# Patient Record
Sex: Female | Born: 1955 | Race: White | Hispanic: No | Smoking: Never smoker
Health system: Southern US, Community
[De-identification: ages and names within clinical notes are randomized; demographics above are authoritative.]

## PROBLEM LIST (undated history)

## (undated) DIAGNOSIS — F32A Depression, unspecified: Secondary | ICD-10-CM

## (undated) DIAGNOSIS — Z01818 Encounter for other preprocedural examination: Secondary | ICD-10-CM

## (undated) DIAGNOSIS — M169 Osteoarthritis of hip, unspecified: Secondary | ICD-10-CM

## (undated) DIAGNOSIS — Z9189 Other specified personal risk factors, not elsewhere classified: Secondary | ICD-10-CM

## (undated) DIAGNOSIS — R011 Cardiac murmur, unspecified: Secondary | ICD-10-CM

## (undated) DIAGNOSIS — H9193 Unspecified hearing loss, bilateral: Secondary | ICD-10-CM

## (undated) DIAGNOSIS — G5793 Unspecified mononeuropathy of bilateral lower limbs: Secondary | ICD-10-CM

## (undated) DIAGNOSIS — A6000 Herpesviral infection of urogenital system, unspecified: Secondary | ICD-10-CM

## (undated) DIAGNOSIS — Z974 Presence of external hearing-aid: Secondary | ICD-10-CM

## (undated) DIAGNOSIS — K121 Other forms of stomatitis: Secondary | ICD-10-CM

## (undated) DIAGNOSIS — M8588 Other specified disorders of bone density and structure, other site: Secondary | ICD-10-CM

## (undated) DIAGNOSIS — M722 Plantar fascial fibromatosis: Secondary | ICD-10-CM

## (undated) DIAGNOSIS — F329 Major depressive disorder, single episode, unspecified: Secondary | ICD-10-CM

## (undated) DIAGNOSIS — Z8669 Personal history of other diseases of the nervous system and sense organs: Secondary | ICD-10-CM

## (undated) DIAGNOSIS — K219 Gastro-esophageal reflux disease without esophagitis: Secondary | ICD-10-CM

## (undated) DIAGNOSIS — B009 Herpesviral infection, unspecified: Secondary | ICD-10-CM

## (undated) HISTORY — DX: Plantar fascial fibromatosis: M72.2

## (undated) HISTORY — DX: Other specified disorders of bone density and structure, other site: M85.88

## (undated) HISTORY — DX: Major depressive disorder, single episode, unspecified: F32.9

## (undated) HISTORY — DX: Depression, unspecified: F32.A

## (undated) HISTORY — PX: FOOT SURGERY: SHX648

## (undated) HISTORY — DX: Osteoarthritis of hip, unspecified: M16.9

## (undated) HISTORY — DX: Herpesviral infection of urogenital system, unspecified: A60.00

## (undated) HISTORY — DX: Unspecified mononeuropathy of bilateral lower limbs: G57.93

## (undated) HISTORY — DX: Other forms of stomatitis: K12.1

## (undated) HISTORY — DX: Cardiac murmur, unspecified: R01.1

## (undated) HISTORY — DX: Herpesviral infection, unspecified: B00.9

## (undated) HISTORY — DX: Unspecified hearing loss, bilateral: H91.93

## (undated) HISTORY — DX: Gastro-esophageal reflux disease without esophagitis: K21.9

---

## 1990-04-10 HISTORY — PX: UPPER GI ENDOSCOPY: SHX6162

## 2004-06-14 ENCOUNTER — Ambulatory Visit: Payer: Self-pay | Admitting: Unknown Physician Specialty

## 2004-10-25 ENCOUNTER — Ambulatory Visit: Payer: Self-pay | Admitting: Neurology

## 2004-11-28 ENCOUNTER — Ambulatory Visit: Payer: Self-pay | Admitting: Neurology

## 2005-09-01 ENCOUNTER — Ambulatory Visit: Payer: Self-pay | Admitting: Unknown Physician Specialty

## 2006-09-12 ENCOUNTER — Ambulatory Visit: Payer: Self-pay | Admitting: Unknown Physician Specialty

## 2006-09-21 ENCOUNTER — Ambulatory Visit: Payer: Self-pay | Admitting: Unknown Physician Specialty

## 2007-10-23 ENCOUNTER — Ambulatory Visit: Payer: Self-pay | Admitting: Unknown Physician Specialty

## 2008-04-10 DIAGNOSIS — M8588 Other specified disorders of bone density and structure, other site: Secondary | ICD-10-CM

## 2008-04-10 HISTORY — PX: COLONOSCOPY: SHX174

## 2008-04-10 HISTORY — DX: Other specified disorders of bone density and structure, other site: M85.88

## 2008-09-14 ENCOUNTER — Ambulatory Visit: Payer: Self-pay | Admitting: Specialist

## 2008-09-22 ENCOUNTER — Inpatient Hospital Stay: Payer: Self-pay | Admitting: Specialist

## 2008-10-08 HISTORY — PX: TOTAL HIP ARTHROPLASTY: SHX124

## 2009-01-19 ENCOUNTER — Ambulatory Visit: Payer: Self-pay | Admitting: Unknown Physician Specialty

## 2009-02-10 ENCOUNTER — Ambulatory Visit: Payer: Self-pay | Admitting: Unknown Physician Specialty

## 2009-03-26 ENCOUNTER — Ambulatory Visit: Payer: Self-pay | Admitting: Unknown Physician Specialty

## 2009-04-05 LAB — HM COLONOSCOPY

## 2010-01-25 ENCOUNTER — Ambulatory Visit: Payer: Self-pay | Admitting: Unknown Physician Specialty

## 2011-01-31 ENCOUNTER — Ambulatory Visit: Payer: Self-pay | Admitting: Unknown Physician Specialty

## 2011-06-09 HISTORY — PX: REPLACEMENT TOTAL KNEE: SUR1224

## 2011-06-13 DIAGNOSIS — M1710 Unilateral primary osteoarthritis, unspecified knee: Secondary | ICD-10-CM | POA: Insufficient documentation

## 2011-06-13 DIAGNOSIS — M23305 Other meniscus derangements, unspecified medial meniscus, unspecified knee: Secondary | ICD-10-CM | POA: Insufficient documentation

## 2011-08-21 ENCOUNTER — Encounter: Payer: Self-pay | Admitting: Orthopaedic Surgery

## 2011-09-09 ENCOUNTER — Encounter: Payer: Self-pay | Admitting: Orthopaedic Surgery

## 2011-11-13 DIAGNOSIS — S83419A Sprain of medial collateral ligament of unspecified knee, initial encounter: Secondary | ICD-10-CM | POA: Insufficient documentation

## 2012-09-09 DIAGNOSIS — T84498A Other mechanical complication of other internal orthopedic devices, implants and grafts, initial encounter: Secondary | ICD-10-CM | POA: Insufficient documentation

## 2012-09-09 DIAGNOSIS — M25559 Pain in unspecified hip: Secondary | ICD-10-CM | POA: Insufficient documentation

## 2014-02-06 ENCOUNTER — Ambulatory Visit: Payer: Self-pay | Admitting: Family Medicine

## 2014-08-31 ENCOUNTER — Other Ambulatory Visit: Payer: Self-pay | Admitting: Family Medicine

## 2014-08-31 ENCOUNTER — Ambulatory Visit
Admission: RE | Admit: 2014-08-31 | Discharge: 2014-08-31 | Disposition: A | Discharge status: Home / Self Care | Payer: 59 | Source: Ambulatory Visit | Attending: Family Medicine | Admitting: Family Medicine

## 2014-08-31 DIAGNOSIS — J3489 Other specified disorders of nose and nasal sinuses: Secondary | ICD-10-CM | POA: Insufficient documentation

## 2014-09-09 DIAGNOSIS — R0602 Shortness of breath: Secondary | ICD-10-CM | POA: Insufficient documentation

## 2014-09-09 DIAGNOSIS — R9431 Abnormal electrocardiogram [ECG] [EKG]: Secondary | ICD-10-CM | POA: Insufficient documentation

## 2014-11-02 ENCOUNTER — Ambulatory Visit: Payer: Self-pay | Admitting: Family Medicine

## 2014-11-27 ENCOUNTER — Other Ambulatory Visit: Payer: Self-pay | Admitting: Family Medicine

## 2014-11-27 NOTE — Telephone Encounter (Signed)
Routing to provider  

## 2015-01-19 ENCOUNTER — Other Ambulatory Visit: Payer: Self-pay | Admitting: Family Medicine

## 2015-01-19 NOTE — Telephone Encounter (Signed)
Approved, med list duplicates removed

## 2015-01-19 NOTE — Telephone Encounter (Signed)
Routing to provider  

## 2015-03-03 DIAGNOSIS — F32A Depression, unspecified: Secondary | ICD-10-CM | POA: Insufficient documentation

## 2015-03-03 DIAGNOSIS — K121 Other forms of stomatitis: Secondary | ICD-10-CM | POA: Insufficient documentation

## 2015-03-03 DIAGNOSIS — K219 Gastro-esophageal reflux disease without esophagitis: Secondary | ICD-10-CM | POA: Insufficient documentation

## 2015-03-03 DIAGNOSIS — F329 Major depressive disorder, single episode, unspecified: Secondary | ICD-10-CM | POA: Insufficient documentation

## 2015-03-03 DIAGNOSIS — H9193 Unspecified hearing loss, bilateral: Secondary | ICD-10-CM | POA: Insufficient documentation

## 2015-03-03 DIAGNOSIS — A6000 Herpesviral infection of urogenital system, unspecified: Secondary | ICD-10-CM | POA: Insufficient documentation

## 2015-03-03 DIAGNOSIS — B009 Herpesviral infection, unspecified: Secondary | ICD-10-CM | POA: Insufficient documentation

## 2015-03-03 DIAGNOSIS — M169 Osteoarthritis of hip, unspecified: Secondary | ICD-10-CM | POA: Insufficient documentation

## 2015-03-03 DIAGNOSIS — M722 Plantar fascial fibromatosis: Secondary | ICD-10-CM | POA: Insufficient documentation

## 2015-03-10 ENCOUNTER — Encounter: Payer: Self-pay | Admitting: Family Medicine

## 2015-03-10 ENCOUNTER — Encounter: Payer: Self-pay | Admitting: *Deleted

## 2015-03-10 ENCOUNTER — Ambulatory Visit (INDEPENDENT_AMBULATORY_CARE_PROVIDER_SITE_OTHER): Payer: 59 | Admitting: Family Medicine

## 2015-03-10 VITALS — BP 106/69 | HR 66 | Temp 98.6°F | Ht 64.0 in | Wt 175.0 lb

## 2015-03-10 DIAGNOSIS — M545 Low back pain, unspecified: Secondary | ICD-10-CM

## 2015-03-10 DIAGNOSIS — M722 Plantar fascial fibromatosis: Secondary | ICD-10-CM | POA: Diagnosis not present

## 2015-03-10 DIAGNOSIS — R1310 Dysphagia, unspecified: Secondary | ICD-10-CM

## 2015-03-10 DIAGNOSIS — Z23 Encounter for immunization: Secondary | ICD-10-CM | POA: Diagnosis not present

## 2015-03-10 DIAGNOSIS — K219 Gastro-esophageal reflux disease without esophagitis: Secondary | ICD-10-CM | POA: Diagnosis not present

## 2015-03-10 DIAGNOSIS — M1612 Unilateral primary osteoarthritis, left hip: Secondary | ICD-10-CM

## 2015-03-10 LAB — MICROSCOPIC EXAMINATION

## 2015-03-10 MED ORDER — MELOXICAM 7.5 MG PO TABS: 7.5000 mg | ORAL_TABLET | Freq: Every day | ORAL | Status: DC

## 2015-03-10 MED ORDER — CYCLOBENZAPRINE HCL 5 MG PO TABS: 5.0000 mg | ORAL_TABLET | Freq: Three times a day (TID) | ORAL | Status: DC | PRN

## 2015-03-10 NOTE — Assessment & Plan Note (Signed)
Avoid triggers; on PPI; refer for consideration of EGD; has had esophageal stricture and stretching in the past

## 2015-03-10 NOTE — Patient Instructions (Addendum)
Return to see Amy at least 4 weeks after your flu shot to get the shingles vaccine You can shed live virus for up to 8 weeks after the vaccine Try turmeric as a natural anti-inflammatory (for pain and arthritis). It comes in capsules where you buy aspirin and fish oil, but also as a spice where you buy pepper and garlic powder. Try the lower dose of muscle relaxer in the late evening to see if that helps you sleep; do NOT mix with alcohol or pain pills or sleeping pills Do not drive for eight hours after taking the muscle relaxant We'll check your urine today Use the meloxicam daily if needed for inflammation and discomfort, but stop if making reflux worse Avoid triggers for reflux We'll have you see Dr. Lemar LivingsByrnett for consideration of an upper endoscopy

## 2015-03-10 NOTE — Progress Notes (Signed)
BP 106/69 mmHg  Pulse 66  Temp(Src) 98.6 F (37 C)  Ht  (1.626 m)  Wt 175 lb (79.379 kg)  BMI 30.02 kg/m2  SpO2 98%   Subjective:    Patient ID: Olivia Morgan, female    DOB: 1956/01/27, 59 y.o.   MRN: [id number]  HPI: Olivia Morgan is a 59 y.o. female  Chief Complaint  Patient presents with  . Back Pain    lower back, states its off and on, has been going to a chiropractor.  . Vaccine    She is interested in the shingles vacccine   She is having low back issues; going to chiropractor and that helps some; it's in the lower back; he has given her stretching exercises; she has tried heat and ice, alternates those; has taken tylenol 8 hour 650 mg pills just sometimes; no blood in the urine; there is a very specific point where chiropractor can hit and cause pain in the lower back; he knows exactly where it is; going on for a couple of months; was at the beach in [place] and it was crowded and she couldn't move much, was in a lot of pain after standing for 60 minutes and not being able to move much; no numbness or tingling down either leg; lower back will be worse after laying and has talked to chiropractor about her sleep; not a good sleeper; can't sleep on back; chiropractor suggested she sleep on her side and put pillow between legs; the chiropractor did xrays years ago but nothing recently; she has scoliosis; she just saw him yesterday; has a turned pelvis too; pain along left SI joint; she has tight muscles in her neck, but not in the lower back; sits at a desk all day; uses back pillow at work  She already got a flu shot in early November; she felt achy for a couple of days after the shot (less than 4 weeks ago)  She is interested in getting the shingles vaccine; her ex-mother-in-law got shingles and was miserable for weeks; she is 59 years old; no exposure to chemotherapy; she is not around pregnant women or cancer patients; friend on prednisone, so we discussed that causing  immunosuppression  She wondered about getting another upper endoscopy; no abd pain; takes PPI daily; some days, the pain just "kills me" she says; no sticking with swallowing exactly but sometimes when she eats, it almost stops and doesn't go down and then will go down on its own; this happens to her father and brother too; going on for years; gets hung and hurts; brother told her uncle had it too; no blood in stools  Relevant past medical, surgical, family and social history reviewed and updated as indicated. Interim medical history since our last visit reviewed. Allergies and medications reviewed and updated.  Review of Systems Per HPI unless specifically indicated above     Objective:    BP 106/69 mmHg  Pulse 66  Temp(Src) 98.6 F (37 C)  Ht  (1.626 m)  Wt 175 lb (79.379 kg)  BMI 30.02 kg/m2  SpO2 98%  Wt Readings from Last 3 Encounters:  03/10/15 175 lb (79.379 kg)  08/31/14 173 lb (78.472 kg)    Physical Exam  Constitutional: She appears well-developed and well-nourished.  Two pound weight gain over last 6 months; now in obese category  HENT:  Head: Normocephalic and atraumatic.  Right Ear: Decreased hearing is noted.  Left Ear: Decreased hearing is noted.  Mouth/Throat:  Mucous membranes are normal.  Cardiovascular: Normal rate.   Pulmonary/Chest: Effort normal. No accessory muscle usage. No respiratory distress.  Abdominal: Soft. Normal appearance. She exhibits no distension. There is no tenderness.  Musculoskeletal:       Thoracic back: She exhibits deformity (scoliosis).       Lumbar back: She exhibits tenderness and deformity (scoliosis). She exhibits normal range of motion, no bony tenderness and no spasm.  Neurological: She displays no tremor.  Hip flexion, dorsiflexion, plantarflexion 5/5  Skin: Skin is warm. No rash noted. No pallor.  Psychiatric: She has a normal mood and affect.   Results for orders placed or performed in visit on 03/10/15  Microscopic  Examination  Result Value Ref Range   WBC, UA 0-5 0 -  5 /hpf   RBC, UA 0-2 0 -  2 /hpf   Epithelial Cells (non renal) 0-10 0 - 10 /hpf   Bacteria, UA Few None seen/Few  UA/M w/rflx Culture, Routine  Result Value Ref Range   Urine Culture, Routine Final report    Urine Culture result 1 Comment       Assessment & Plan:   Problem List Items Addressed This Visit      Digestive   GERD (gastroesophageal reflux disease)    Avoid triggers; on PPI; refer for consideration of EGD; has had esophageal stricture and stretching in the past      Dysphagia    Will refer to GI for consideration of an EGD; she sounds to have positive family hx of same, so not sure if congenital deformity or muscle coordination, will have GI evaluate      Relevant Orders   Ambulatory referral to Gastroenterology     Musculoskeletal and Integument   OA (osteoarthritis) of hip    Consider that unilateral OA may have contributed to back pain if mechanics of walking have changed      Relevant Medications   meloxicam (MOBIC) 7.5 MG tablet   cyclobenzaprine (FLEXERIL) 5 MG tablet   Plantar fasciitis    perhaps walking differently may have contributed to back pain        Other   Lower back pain - Primary    Urine checked today to r/o microscopic hematuria; continue to see chiropractor; NSAID, muscle relaxant; let me know if not improving      Relevant Medications   meloxicam (MOBIC) 7.5 MG tablet   cyclobenzaprine (FLEXERIL) 5 MG tablet   Other Relevant Orders   UA/M w/rflx Culture, Routine (Completed)   Need for shingles vaccine    Will have her return in a few weeks to get the vaccine here; discussed and counseled, may shed live virus for up to 8 weeks, discussed susceptible populations she should avoid during that time including individuals on high dose prednisone or other immunosuppressive drugs         Follow up plan: Return if symptoms worsen or fail to improve, for and return to see Amy for a  shingles vaccine in about two weeks.  An after-visit summary was printed and given to the patient at check-out.  Please see the patient instructions which may contain other information and recommendations beyond what is mentioned above in the assessment and plan.  Meds ordered this encounter  Medications  . meloxicam (MOBIC) 7.5 MG tablet    Sig: Take 1 tablet (7.5 mg total) by mouth daily. If needed for back    Dispense:  30 tablet    Refill:  0  .  cyclobenzaprine (FLEXERIL) 5 MG tablet    Sig: Take 1 tablet (5 mg total) by mouth 3 (three) times daily as needed for muscle spasms.    Dispense:  30 tablet    Refill:  0

## 2015-03-12 LAB — UA/M W/RFLX CULTURE, ROUTINE

## 2015-03-14 DIAGNOSIS — Z23 Encounter for immunization: Secondary | ICD-10-CM | POA: Insufficient documentation

## 2015-03-14 NOTE — Assessment & Plan Note (Signed)
perhaps walking differently may have contributed to back pain

## 2015-03-14 NOTE — Assessment & Plan Note (Signed)
Will have her return in a few weeks to get the vaccine here; discussed and counseled, may shed live virus for up to 8 weeks, discussed susceptible populations she should avoid during that time including individuals on high dose prednisone or other immunosuppressive drugs

## 2015-03-14 NOTE — Assessment & Plan Note (Signed)
Urine checked today to r/o microscopic hematuria; continue to see chiropractor; NSAID, muscle relaxant; let me know if not improving

## 2015-03-14 NOTE — Assessment & Plan Note (Signed)
Consider that unilateral OA may have contributed to back pain if mechanics of walking have changed

## 2015-03-14 NOTE — Assessment & Plan Note (Signed)
Will refer to GI for consideration of an EGD; she sounds to have positive family hx of same, so not sure if congenital deformity or muscle coordination, will have GI evaluate

## 2015-03-18 ENCOUNTER — Ambulatory Visit (INDEPENDENT_AMBULATORY_CARE_PROVIDER_SITE_OTHER): Payer: 59 | Admitting: General Surgery

## 2015-03-18 ENCOUNTER — Encounter: Payer: Self-pay | Admitting: General Surgery

## 2015-03-18 VITALS — BP 128/78 | HR 60 | Resp 12 | Ht 64.0 in | Wt 175.0 lb

## 2015-03-18 DIAGNOSIS — K219 Gastro-esophageal reflux disease without esophagitis: Secondary | ICD-10-CM | POA: Diagnosis not present

## 2015-03-18 NOTE — Patient Instructions (Addendum)
The patient is aware to call back for any questions or concerns. Esophagogastroduodenoscopy Esophagogastroduodenoscopy (EGD) is a procedure that is used to examine the lining of the esophagus, stomach, and first part of the small intestine (duodenum). A long, flexible, lighted tube with a camera attached (endoscope) is inserted down the throat to view these organs. This procedure is done to detect problems or abnormalities, such as inflammation, bleeding, ulcers, or growths, in order to treat them. The procedure lasts 5-20 minutes. It is usually an outpatient procedure, but it may need to be performed in a hospital in emergency cases. LET [facility] CARE PROVIDER KNOW ABOUT:  Any allergies you have.  All medicines you are taking, including vitamins, herbs, eye drops, creams, and over-the-counter medicines.  Previous problems you or members of your family have had with the use of anesthetics.  Any blood disorders you have.  Previous surgeries you have had.  Medical conditions you have. RISKS AND COMPLICATIONS Generally, this is a safe procedure. However, problems can occur and include:  Infection.  Bleeding.  Tearing (perforation) of the esophagus, stomach, or duodenum.  Difficulty breathing or not being able to breathe.  Excessive sweating.  Spasms of the larynx.  Slowed heartbeat.  Low blood pressure. BEFORE THE PROCEDURE  Do not eat or drink anything after midnight on the night before the procedure or as directed by your health care provider.  Do not take your regular medicines before the procedure if your health care provider asks you not to. Ask your health care provider about changing or stopping those medicines.  If you wear dentures, be prepared to remove them before the procedure.  Arrange for someone to drive you home after the procedure. PROCEDURE  A numbing medicine (local anesthetic) may be sprayed in your throat for comfort and to stop you from gagging or  coughing.  You will have an IV tube inserted in a vein in your hand or arm. You will receive medicines and fluids through this tube.  You will be given a medicine to relax you (sedative).  A pain reliever will be given through the IV tube.  A mouth guard may be placed in your mouth to protect your teeth and to keep you from biting on the endoscope.  You will be asked to lie on your left side.  The endoscope will be inserted down your throat and into your esophagus, stomach, and duodenum.  Air will be put through the endoscope to allow your health care provider to clearly view the lining of your esophagus.  The lining of your esophagus, stomach, and duodenum will be examined. During the exam, your health care provider may:  Remove tissue to be examined under a microscope (biopsy) for inflammation, infection, or other medical problems.  Remove growths.  Remove objects (foreign bodies) that are stuck.  Treat any bleeding with medicines or other devices that stop tissues from bleeding (hot cautery, clipping devices).  Widen (dilate) or stretch narrowed areas of your esophagus and stomach.  The endoscope will be withdrawn. AFTER THE PROCEDURE  You will be taken to a recovery area for observation. Your blood pressure, heart rate, breathing rate, and blood oxygen level will be monitored often until the medicines you were given have worn off.  Do not eat or drink anything until the numbing medicine has worn off and your gag reflex has returned. You may choke.  Your health care provider should be able to discuss his or her findings with you. It will take  longer to discuss the test results if any biopsies were taken.   This information is not intended to replace advice given to you by your health care provider. Make sure you discuss any questions you have with your health care provider.   Document Released: 07/28/2004 Document Revised: 04/17/2014 Document Reviewed: 02/28/2012 Elsevier  Interactive Patient Education [employer]2016 [employer]

## 2015-03-18 NOTE — Progress Notes (Signed)
Patient ID: Olivia Morgan, female   DOB: 1956/01/31, 59 y.o.   MRN: [id number]  Chief Complaint  Patient presents with  . Other    acid reflex    HPI Olivia Morgan is a 59 y.o. female here today for a evaluation of acid reflex. She feels like the reflux is a little worse over the past 1-2 years.She states it is worse during the day and maybe associated with stress. Spicy foods ([place]) trigger the reflux.She feels it come up in her throat and it makes her cough. Occasionally, she has noticed that food may "hang" but feels it maybe stress as well. She states her weight has been steady over the past 3 years.  Cardiac evaluation was by Dr Gwen Pounds.  I personally reviewed the patient's history. HPI  Past Medical History  Diagnosis Date  . Herpes simplex virus infection   . OA (osteoarthritis) of hip     left  . GERD (gastroesophageal reflux disease)   . Plantar fasciitis   . Depression   . Stomatitis   . Genital herpes   . Bilateral hearing loss     wears hearing aids  . Murmur     Past Surgical History  Procedure Laterality Date  . Cesarean section    . 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 Replacement total knee  3/13    Dr.Hallows at [facility]  . Foot surgery Right   . Upper gi endoscopy  1992  . Colonoscopy  2010    Family History  Problem Relation Age of Onset  . Aneurysm Mother   . Stroke Mother   . Heart disease Father 74  . Hypertension Father   . Lung disease Father   . Heart disease Paternal Grandfather   . Hypertension Brother   . Cancer Neg Hx   . COPD Neg Hx   . Diabetes Neg Hx     Social History Social History  Substance Use Topics  . Smoking status: Never Smoker   . Smokeless tobacco: Never Used  . Alcohol Use: No    Allergies  Allergen Reactions  . Celecoxib Other (See Comments)  . Celexa [Citalopram Hydrobromide]   . Cymbalta [Duloxetine Hcl] Hives  . Levaquin [Levofloxacin In D5w] Hives  . Valium [Diazepam] Other (See  Comments)    hallucinations  . Monistat [Miconazole] Rash  . Prednisone Anxiety    Current Outpatient Prescriptions  Medication Sig Dispense Refill  . buPROPion (WELLBUTRIN XL) 150 MG 24 hr tablet Take 3 tablets by mouth  daily 270 tablet 1  . Cholecalciferol (VITAMIN D-3 PO) Take by mouth daily.    . cyclobenzaprine (FLEXERIL) 5 MG tablet Take 1 tablet (5 mg total) by mouth 3 (three) times daily as needed for muscle spasms. 30 tablet 0  . meloxicam (MOBIC) 7.5 MG tablet Take 1 tablet (7.5 mg total) by mouth daily. If needed for back 30 tablet 0  . Omega-3 Fatty Acids (FISH OIL PO) Take by mouth daily.    . RABEprazole (ACIPHEX) 20 MG tablet Take 2 tablets by mouth  daily as needed ;Prolonged  use increases risk of  osteoporosis, anemia,  pneumonia 180 tablet 0  . valACYclovir (VALTREX) 500 MG tablet Take 1 tablet by mouth  daily for suppressive  therapy 90 tablet 1  . VITAMIN E PO Take by mouth daily.     No current facility-administered medications for this visit.    Review of Systems Review of Systems  Constitutional: Negative.  Respiratory: Negative.   Cardiovascular: Negative.     Blood pressure 128/78, pulse 60, resp. rate 12, height 5\' 4"  (1.626 m), weight 175 lb (79.379 kg).  Physical Exam Physical Exam  Constitutional: She is oriented to person, place, and time. She appears well-developed and well-nourished.  HENT:  Mouth/Throat: Oropharynx is clear and moist.  Eyes: Conjunctivae are normal. No scleral icterus.  Neck: Neck supple.  Cardiovascular: Normal rate and regular rhythm.   Murmur heard.  Systolic murmur is present with a grade of 2/6  Pulmonary/Chest: Effort normal and breath sounds normal.  Lymphadenopathy:    She has no cervical adenopathy.  Neurological: She is alert and oriented to person, place, and time.  Skin: Skin is warm and dry.  Psychiatric: Her behavior is normal.    Data Reviewed No records available.  Assessment    Dysphasia     Plan    Options for evaluation including barium swallow versus endoscopy were discussed. We will start with an upper endoscopy, and follow up with a barium study if needed. The procedure was reviewed, risks and benefits discussed.       PCP:  Daryll BrodLada, Melinda P  Byrnett, Jeffrey W 03/19/2015, 6:49 AM

## 2015-03-19 DIAGNOSIS — K219 Gastro-esophageal reflux disease without esophagitis: Secondary | ICD-10-CM | POA: Insufficient documentation

## 2015-03-22 ENCOUNTER — Telehealth: Payer: Self-pay | Admitting: *Deleted

## 2015-03-22 NOTE — Telephone Encounter (Signed)
Message for patient to call the office.  [facility] can accommodate Dr. Lemar LivingsByrnett to complete upper endoscopy on Wednesday, 03-24-15.

## 2015-03-22 NOTE — Telephone Encounter (Signed)
Patient called the office back and was notified as instructed. She verbalizes understanding.   This patient was instructed to call the office should she have further questions.

## 2015-03-24 DIAGNOSIS — R1310 Dysphagia, unspecified: Secondary | ICD-10-CM | POA: Diagnosis not present

## 2015-04-02 ENCOUNTER — Telehealth: Payer: Self-pay | Admitting: Family Medicine

## 2015-04-02 MED ORDER — FLUCONAZOLE 150 MG PO TABS: 150.0000 mg | ORAL_TABLET | Freq: Once | ORAL | Status: DC

## 2015-04-02 NOTE — Telephone Encounter (Signed)
Forward to provider

## 2015-04-02 NOTE — Telephone Encounter (Signed)
done

## 2015-04-02 NOTE — Telephone Encounter (Signed)
Pt says she feels like she is getting a yeast infection and would like to have diflucan sent in to [facility].

## 2015-04-05 ENCOUNTER — Telehealth: Payer: Self-pay | Admitting: Family Medicine

## 2015-04-05 DIAGNOSIS — N183 Chronic kidney disease, stage 3 unspecified: Secondary | ICD-10-CM

## 2015-04-05 DIAGNOSIS — N182 Chronic kidney disease, stage 2 (mild): Secondary | ICD-10-CM | POA: Insufficient documentation

## 2015-04-05 DIAGNOSIS — D696 Thrombocytopenia, unspecified: Secondary | ICD-10-CM

## 2015-04-05 NOTE — Assessment & Plan Note (Signed)
GFR 53 May 2016

## 2015-04-05 NOTE — Telephone Encounter (Signed)
Please let Olivia Morgan know that we'd like her to come in for labs only Please schedule a lab visit in the next few weeks Fasting?  NO Just to keep an eye on her kidney function and platelets while taking Mobic (meloxicam) Thank you, Dr. Sherie DonLada I sent in the refill as requested

## 2015-04-05 NOTE — Assessment & Plan Note (Signed)
Check platelet count in next few weeks; platelet count was 147 in May 2016

## 2015-04-07 NOTE — Telephone Encounter (Signed)
Patient schedule appt on 04/13/05

## 2015-04-08 ENCOUNTER — Telehealth: Payer: Self-pay | Admitting: *Deleted

## 2015-04-08 NOTE — Telephone Encounter (Signed)
Patient called stating she wanted to know what the results were from the upper [facility] done [facility].

## 2015-04-14 ENCOUNTER — Telehealth: Payer: Self-pay | Admitting: General Surgery

## 2015-04-14 ENCOUNTER — Other Ambulatory Visit: Payer: 59

## 2015-04-14 DIAGNOSIS — N183 Chronic kidney disease, stage 3 unspecified: Secondary | ICD-10-CM

## 2015-04-14 NOTE — Telephone Encounter (Signed)
Results from her upper endoscopy completed for reflux symptoms from 03/24/2015 a become available. The visualization of the distal esophagus showed a near normal exam with minimal irritation. Nothing to suggest Barrett's epithelial changes.  The patient had numerous gastric polyps, several which were removed and showed benign, fundic gland polyps. No evidence of H. pylori infection.  The patient has noted some dietary response to her reflux symptoms and occasionally makes use of additional doses of AcipHex.  Patient was encouraged to consider 1) sees elevation of the head of the bed on blocks to minimize nocturnal reflex; 2) lower carb diet/avoidance of caffeine; 3) OTC H2 blocker such as Zantac for times of increased reflux symptoms and place an additional dose of AcipHex.  The patient will give the above-mentioned suggestions a trial for the next 6 weeks and give a phone follow-up with her progress.

## 2015-04-15 ENCOUNTER — Encounter: Payer: Self-pay | Admitting: Family Medicine

## 2015-04-15 LAB — BASIC METABOLIC PANEL
BUN / CREAT RATIO: 14 (ref 11–26)
BUN: 13 mg/dL (ref 8–27)
CALCIUM: 9.2 mg/dL (ref 8.7–10.3)
CO2: 25 mmol/L (ref 18–29)
CREATININE: 0.9 mg/dL (ref 0.57–1.00)
Chloride: 102 mmol/L (ref 96–106)
GFR calc non Af Amer: 70 mL/min/{1.73_m2} (ref >59)
GFR, EST AFRICAN AMERICAN: 80 mL/min/{1.73_m2} (ref >59)
GLUCOSE: 85 mg/dL (ref 65–99)
Potassium: 4.2 mmol/L (ref 3.5–5.2)
Sodium: 141 mmol/L (ref 134–144)

## 2015-04-15 LAB — CBC WITH DIFFERENTIAL/PLATELET
BASOS ABS: 0 10*3/uL (ref 0.0–0.2)
Basos: 1 %
EOS (ABSOLUTE): 0.2 10*3/uL (ref 0.0–0.4)
Eos: 3 %
HEMOGLOBIN: 14.6 g/dL (ref 11.1–15.9)
Hematocrit: 43.6 % (ref 34.0–46.6)
IMMATURE GRANS (ABS): 0 10*3/uL (ref 0.0–0.1)
IMMATURE GRANULOCYTES: 0 %
LYMPHS: 16 %
Lymphocytes Absolute: 0.8 10*3/uL (ref 0.7–3.1)
MCH: 30.9 pg (ref 26.6–33.0)
MCHC: 33.5 g/dL (ref 31.5–35.7)
MCV: 92 fL (ref 79–97)
MONOCYTES: 8 %
Monocytes Absolute: 0.4 10*3/uL (ref 0.1–0.9)
NEUTROS PCT: 72 %
Neutrophils Absolute: 3.4 10*3/uL (ref 1.4–7.0)
Platelets: 148 10*3/uL — ABNORMAL LOW (ref 150–379)
RBC: 4.72 x10E6/uL (ref 3.77–5.28)
RDW: 14.4 % (ref 12.3–15.4)
WBC: 4.7 10*3/uL (ref 3.4–10.8)

## 2015-04-22 ENCOUNTER — Encounter: Payer: Self-pay | Admitting: General Surgery

## 2015-04-23 ENCOUNTER — Other Ambulatory Visit: Payer: Self-pay | Admitting: Family Medicine

## 2015-04-24 NOTE — Telephone Encounter (Signed)
I reviewed Dr. Rutherford NailByrnett's note, EGD results; Rx approved

## 2015-04-26 ENCOUNTER — Encounter: Payer: Self-pay | Admitting: General Surgery

## 2015-05-03 ENCOUNTER — Other Ambulatory Visit: Payer: Self-pay | Admitting: Family Medicine

## 2015-05-03 NOTE — Telephone Encounter (Signed)
Jan 2017 labs reviewed; Rx approved

## 2015-05-14 ENCOUNTER — Ambulatory Visit: Payer: 59 | Admitting: Family Medicine

## 2015-05-20 ENCOUNTER — Ambulatory Visit (INDEPENDENT_AMBULATORY_CARE_PROVIDER_SITE_OTHER): Payer: 59 | Admitting: Family Medicine

## 2015-05-20 ENCOUNTER — Encounter: Payer: Self-pay | Admitting: Family Medicine

## 2015-05-20 VITALS — BP 106/69 | HR 62 | Temp 98.2°F | Wt 171.0 lb

## 2015-05-20 DIAGNOSIS — N182 Chronic kidney disease, stage 2 (mild): Secondary | ICD-10-CM

## 2015-05-20 DIAGNOSIS — M21612 Bunion of left foot: Secondary | ICD-10-CM | POA: Diagnosis not present

## 2015-05-20 DIAGNOSIS — M7062 Trochanteric bursitis, left hip: Secondary | ICD-10-CM

## 2015-05-20 DIAGNOSIS — Z23 Encounter for immunization: Secondary | ICD-10-CM

## 2015-05-20 DIAGNOSIS — G5793 Unspecified mononeuropathy of bilateral lower limbs: Secondary | ICD-10-CM | POA: Diagnosis not present

## 2015-05-20 MED ORDER — GABAPENTIN 100 MG PO CAPS
ORAL_CAPSULE | ORAL | Status: DC
Start: 2015-05-20 — End: 2015-06-11

## 2015-05-20 NOTE — Progress Notes (Signed)
BP 106/69 mmHg  Pulse 62  Temp(Src) 98.2 F (36.8 C)  Wt 171 lb (77.565 kg)  SpO2 98%   Subjective:    Patient ID: Olivia Morgan, female    DOB: [birth year], 60 y.o.   MRN: [id number]  HPI: Olivia Morgan is a 60 y.o. female  Chief Complaint  Patient presents with  . Foot Pain    left calf and down into her foot. She's had some tingling. No known injury. She's been standing alot at work.   . Immunizations    she is interested in the shingles vaccine   Last week or so, she noticed that she was having cramps in the back of her left calf; the hip replacement scar was a little sore; then some numbness and the ankle was hurting on the left; rubbing the bottom of the foot, she can feel some numbness; has had some issues since her hip surgery with great and 2nd toes with numbness She was sitting a lot in January and was working many many hours; was sitting much longer hours than usual; has pain over the hip as well She went to the chiropractor last week and thinks it was from all the sitting Toes are not cold She has not seen a neurologist She saw the foot doctor a year ago; he checked both foot; she has hammer toe and bunion on the left; he wants to fuse the right great toe; the third toe is already curling under She had nerve studies does a while ago; she took gabapentin after hip surgery Pain over the left greater trochanter (pointing)  She is interested in the shingles vaccine and had several concerns and questions  Relevant past medical, surgical history reviewed Past Medical History  Diagnosis Date  . Herpes simplex virus infection   . OA (osteoarthritis) of hip     left  . GERD (gastroesophageal reflux disease)   . Plantar fasciitis   . Depression   . Stomatitis   . Genital herpes   . Bilateral hearing loss     wears hearing aids  . Murmur   . Neuropathy of both feet (HCC) 06/05/2015   Past Surgical History  Procedure Laterality Date  . Cesarean section    .  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 Replacement total knee  3/13    Dr.Hallows at [facility]  . Foot surgery Right   . Upper gi endoscopy  1992  . Colonoscopy  2010    Interim medical history since our last visit reviewed. Allergies and medications reviewed and updated.  Review of Systems Per HPI unless specifically indicated above     Objective:    BP 106/69 mmHg  Pulse 62  Temp(Src) 98.2 F (36.8 C)  Wt 171 lb (77.565 kg)  SpO2 98%  Wt Readings from Last 3 Encounters:  05/20/15 171 lb (77.565 kg)  03/18/15 175 lb (79.379 kg)  03/10/15 175 lb (79.379 kg)    Physical Exam  Constitutional: She appears well-developed and well-nourished.  HENT:  Mouth/Throat: Mucous membranes are normal.  Eyes: EOM are normal. No scleral icterus.  Cardiovascular: Normal rate and regular rhythm.   Pulmonary/Chest: Effort normal and breath sounds normal.  Musculoskeletal:       Left hip: She exhibits tenderness.       Right foot: There is deformity (third toe with flexion).       Left foot: There is deformity (bunion).  Psychiatric: She has a normal mood and affect. Her  behavior is normal.   Results for orders placed or performed in visit on 04/14/15  CBC with Differential/Platelet  Result Value Ref Range   WBC 4.7 3.4 - 10.8 x10E3/uL   RBC 4.72 3.77 - 5.28 x10E6/uL   Hemoglobin 14.6 11.1 - 15.9 g/dL   Hematocrit 16.1 09.6 - 46.6 %   MCV 92 79 - 97 fL   MCH 30.9 26.6 - 33.0 pg   MCHC 33.5 31.5 - 35.7 g/dL   RDW 04.5 40.9 - 81.1 %   Platelets 148 (L) 150 - 379 x10E3/uL   Neutrophils 72 %   Lymphs 16 %   Monocytes 8 %   Eos 3 %   Basos 1 %   Neutrophils Absolute 3.4 1.4 - 7.0 x10E3/uL   Lymphocytes Absolute 0.8 0.7 - 3.1 x10E3/uL   Monocytes Absolute 0.4 0.1 - 0.9 x10E3/uL   EOS (ABSOLUTE) 0.2 0.0 - 0.4 x10E3/uL   Basophils Absolute 0.0 0.0 - 0.2 x10E3/uL   Immature Granulocytes 0 %   Immature Grans (Abs) 0.0 0.0 - 0.1 x10E3/uL  Basic Metabolic Panel (BMET)  Result  Value Ref Range   Glucose 85 65 - 99 mg/dL   BUN 13 8 - 27 mg/dL   Creatinine, Ser 9.14 0.57 - 1.00 mg/dL   GFR calc non Af Amer 70 >59 mL/min/1.73   GFR calc Af Amer 80 >59 mL/min/1.73   BUN/Creatinine Ratio 14 11 - 26   Sodium 141 134 - 144 mmol/L   Potassium 4.2 3.5 - 5.2 mmol/L   Chloride 102 96 - 106 mmol/L   CO2 25 18 - 29 mmol/L   Calcium 9.2 8.7 - 10.3 mg/dL      Assessment & Plan:   Problem List Items Addressed This Visit      Nervous and Auditory   Neuropathy of both feet (HCC)    Will add low dose gabapentin; call and we can adjust dose further if needed; f/u with podiatrist; I do not suspect thyroid disease; try B12 as well      Relevant Medications   gabapentin (NEURONTIN) 100 MG capsule     Musculoskeletal and Integument   Greater trochanteric bursitis of left hip - Primary    Explained diagnosis; see after visit summary for hand-out; avoid pressure with prolonged sitting, consider saddle-type chair, showed her some on-line; topical ice, etc.      Bunion of left foot    Patient to f/u with foot doctor        Genitourinary   Chronic kidney disease, stage II (mild)    Reviewed more recent labs; suggested avoiding / limiting NSAIDs when possible        Other   Need for shingles vaccine    Significant time counseling about this, answered questions; she may return to have this at her convenience; I will not put in a standing order though, since I am leaving the practice soon and patient's health or condition or medications may change         Follow up plan: Return if symptoms worsen or fail to improve.  Significant time counseling about shingles vaccine; she'll consider and can come back  Meds ordered this encounter  Medications  . Cyanocobalamin (B-12 MICROLOZENGE SL)    Sig: Place under the tongue.  . Cyanocobalamin (B-12 PO)    Sig: Take by mouth daily.  Marland Kitchen gabapentin (NEURONTIN) 100 MG capsule    Sig: One by mouth at bedtime x 3 days, the one  BID x  3 days, then one TID; call doctor with update after 1 week    Dispense:  90 capsule    Refill:  0    Face-to-face time with patient was more than 25 minutes, >50% time spent counseling and coordination of care

## 2015-05-20 NOTE — Patient Instructions (Addendum)
Consider a saddle stool to alleviate pressure on the hip Do see podiatrist Try ice topically over the left hip 3 times a day; 15-20 minutes at a time with cloth in between ice and skin Call me in about 2 weeks with an update on the new medicineHip Bursitis Bursitis is a puffiness (swelling) and soreness of a fluid-filled sac (bursa). This sac covers and protects the joint. HOME CARE  Put ice on the injured area.  Put ice in a plastic bag.  Place a towel between your skin and the bag.  Leave the ice on for 15-20 minutes, 03-04 times a day.  Rest the painful joint as much as possible. Move your joint at least 4 times a day. When pain lessens, start normal, slow movements and normal activities.  Only take medicine as told by your doctor.  Use crutches as told.  Raise (elevate) your painful joint. Use pillows for propping your legs and hips.  Get a massage to lessen pain. GET HELP RIGHT AWAY IF:  Your pain increases or does not improve during treatment.  You have a fever.  You feel heat coming from the affected area.  You see redness and puffiness around the affected area.  You have any questions or concerns. MAKE SURE YOU:  Understand these instructions.  Will watch your condition.  Will get help right away if you are not well or get worse.   This information is not intended to replace advice given to you by your health care provider. Make sure you discuss any questions you have with your health care provider.   Document Released: 04/29/2010 Document Revised: 06/19/2011 Document Reviewed: 10/27/2014 Elsevier Interactive Patient Education [employer]

## 2015-05-26 ENCOUNTER — Telehealth: Payer: Self-pay | Admitting: Family Medicine

## 2015-05-26 NOTE — Telephone Encounter (Signed)
Patient called stating that the medication gabapentin (NEURONTIN) 100 MG capsule has given her a side effect of sores in her mouth. She wants to talk to Dr. Sherie Don regarding this. Patient stated that she is stopping medication, please return patient phone call, thanks.

## 2015-06-05 ENCOUNTER — Encounter: Payer: Self-pay | Admitting: Family Medicine

## 2015-06-05 DIAGNOSIS — M2041 Other hammer toe(s) (acquired), right foot: Secondary | ICD-10-CM | POA: Insufficient documentation

## 2015-06-05 DIAGNOSIS — M7062 Trochanteric bursitis, left hip: Secondary | ICD-10-CM | POA: Insufficient documentation

## 2015-06-05 DIAGNOSIS — G5793 Unspecified mononeuropathy of bilateral lower limbs: Secondary | ICD-10-CM | POA: Insufficient documentation

## 2015-06-05 HISTORY — DX: Unspecified mononeuropathy of bilateral lower limbs: G57.93

## 2015-06-05 NOTE — Assessment & Plan Note (Signed)
Reviewed more recent labs; suggested avoiding / limiting NSAIDs when possible

## 2015-06-05 NOTE — Assessment & Plan Note (Signed)
Explained diagnosis; see after visit summary for hand-out; avoid pressure with prolonged sitting, consider saddle-type chair, showed her some on-line; topical ice, etc.

## 2015-06-05 NOTE — Assessment & Plan Note (Signed)
Will add low dose gabapentin; call and we can adjust dose further if needed; f/u with podiatrist; I do not suspect thyroid disease; try B12 as well

## 2015-06-05 NOTE — Assessment & Plan Note (Signed)
Significant time counseling about this, answered questions; she may return to have this at her convenience; I will not put in a standing order though, since I am leaving the practice soon and patient's health or condition or medications may change

## 2015-06-05 NOTE — Assessment & Plan Note (Signed)
Patient to f/u with foot doctor

## 2015-06-11 ENCOUNTER — Encounter: Payer: Self-pay | Admitting: Family Medicine

## 2015-06-11 ENCOUNTER — Ambulatory Visit (INDEPENDENT_AMBULATORY_CARE_PROVIDER_SITE_OTHER): Payer: 59 | Admitting: Family Medicine

## 2015-06-11 VITALS — BP 134/84 | HR 59 | Temp 97.6°F | Wt 167.0 lb

## 2015-06-11 DIAGNOSIS — J069 Acute upper respiratory infection, unspecified: Secondary | ICD-10-CM | POA: Diagnosis not present

## 2015-06-11 DIAGNOSIS — K219 Gastro-esophageal reflux disease without esophagitis: Secondary | ICD-10-CM

## 2015-06-11 DIAGNOSIS — T887XXS Unspecified adverse effect of drug or medicament, sequela: Secondary | ICD-10-CM | POA: Diagnosis not present

## 2015-06-11 DIAGNOSIS — E663 Overweight: Secondary | ICD-10-CM

## 2015-06-11 DIAGNOSIS — K140 Glossitis: Secondary | ICD-10-CM | POA: Diagnosis not present

## 2015-06-11 DIAGNOSIS — T50905S Adverse effect of unspecified drugs, medicaments and biological substances, sequela: Secondary | ICD-10-CM

## 2015-06-11 MED ORDER — FIRST-DUKES MOUTHWASH MT SUSP: 5.0000 mL | OROMUCOSAL | Status: DC

## 2015-06-11 MED ORDER — ALBUTEROL SULFATE HFA 108 (90 BASE) MCG/ACT IN AERS: 2.0000 | INHALATION_SPRAY | RESPIRATORY_TRACT | Status: DC | PRN

## 2015-06-11 NOTE — Patient Instructions (Addendum)
Try vitamin C (orange juice if not diabetic or vitamin C tablets) and drink green tea to help your immune system during your illness Get plenty of rest and hydration If you get worse or don't start to feel better soon, please do call  Avoid Lyrica and gabapentin in the future  Use the inhaler if needed  Use the mouthwash  VIPSaver.nlHttps://www.accessdata.fda.gov/scripts/medwatch/index.cfm?action=reporting.home FDA MedWatch

## 2015-06-11 NOTE — Progress Notes (Signed)
BP 134/84 mmHg  Pulse 59  Temp(Src) 97.6 F (36.4 C)  Wt 167 lb (75.751 kg)  SpO2 99%   Subjective:    Patient ID: Olivia Morgan, female    DOB: 1955/10/24, 60 y.o.   MRN: [id number]  HPI: Olivia Morgan is a 60 y.o. female  Chief Complaint  Patient presents with  . URI    off and on for 2 weeks, congestion, fatigue, some cough,feels like her throat is swollen but not sore. no fever.    Patient is here for an acute visit Has been sick for a couple of weeks; some days fluctuating No body aches, nothing new Throat does not feel raw sore, but scratchy and feels like something back there; has issues with her throat and has burning mouth syndrome; saw two different ENTs; nothing discovered Tried mouthwash, didn't help; tried H2O2 (straight, non-diluted), made it worse or getting worse anyway; inside of mouth isn't comfortable, around gums Nothing sticking in the upper part of the chest Not taking much cold medicine overall; took a benadryl two days out of hte last week; a cold tablet just twice in the last week too No exposures to strep; used to get tonsillitis Not sure if she's had mono Using the PPI and H2 blocker, she was told she could use both by another specialist She is trying to cut back on carbs; working on some weight loss  Relevant past medical, surgical, family and social history reviewed and updated as indicated. Interim medical history since our last visit reviewed. Allergies and medications reviewed and updated She says she called here in mid-February after experiencing a significant reaction to gabapentin; I apologized and explained that I did not get that message; she says she felt out of control, got angry, started stabbing something like a coaster I believe with a knife; she finally calmed down, but had never been like that  Review of Systems Per HPI unless specifically indicated above     Objective:    BP 134/84 mmHg  Pulse 59  Temp(Src) 97.6 F (36.4  C)  Wt 167 lb (75.751 kg)  SpO2 99%  Wt Readings from Last 3 Encounters:  06/11/15 167 lb (75.751 kg)  05/20/15 171 lb (77.565 kg)  03/18/15 175 lb (79.379 kg)   body mass index is 28.65 kg/(m^2).  Physical Exam  Constitutional: She appears well-developed and well-nourished. No distress.  HENT:  Head: Normocephalic and atraumatic.  Mouth/Throat: No oral lesions. No dental abscesses or uvula swelling. Posterior oropharyngeal erythema (very mildly injected posteriorly) present. No oropharyngeal exudate or posterior oropharyngeal edema.  Hearing aides removed for evaluation; grossly normal canals and TMs; tongue is coated, mildly erythematous, blotchy  Eyes: EOM are normal. Right eye exhibits no discharge. Left eye exhibits no discharge. No scleral icterus.  Cardiovascular: Regular rhythm.   No extrasystoles are present. Bradycardia present.   Borderline bradycardia  Pulmonary/Chest: Effort normal and breath sounds normal.  Lymphadenopathy:    She has no cervical adenopathy.  Skin: Skin is warm. She is not diaphoretic. No pallor.  Psychiatric: She has a normal mood and affect.   Results for orders placed or performed in visit on 04/14/15  CBC with Differential/Platelet  Result Value Ref Range   WBC 4.7 3.4 - 10.8 x10E3/uL   RBC 4.72 3.77 - 5.28 x10E6/uL   Hemoglobin 14.6 11.1 - 15.9 g/dL   Hematocrit 57.843.6 46.934.0 - 46.6 %   MCV 92 79 - 97 fL   MCH 30.9 26.6 -  33.0 pg   MCHC 33.5 31.5 - 35.7 g/dL   RDW 16.1 09.6 - 04.5 %   Platelets 148 (L) 150 - 379 x10E3/uL   Neutrophils 72 %   Lymphs 16 %   Monocytes 8 %   Eos 3 %   Basos 1 %   Neutrophils Absolute 3.4 1.4 - 7.0 x10E3/uL   Lymphocytes Absolute 0.8 0.7 - 3.1 x10E3/uL   Monocytes Absolute 0.4 0.1 - 0.9 x10E3/uL   EOS (ABSOLUTE) 0.2 0.0 - 0.4 x10E3/uL   Basophils Absolute 0.0 0.0 - 0.2 x10E3/uL   Immature Granulocytes 0 %   Immature Grans (Abs) 0.0 0.0 - 0.1 x10E3/uL  Basic Metabolic Panel (BMET)  Result Value Ref Range    Glucose 85 65 - 99 mg/dL   BUN 13 8 - 27 mg/dL   Creatinine, Ser 4.09 0.57 - 1.00 mg/dL   GFR calc non Af Amer 70 >59 mL/min/1.73   GFR calc Af Amer 80 >59 mL/min/1.73   BUN/Creatinine Ratio 14 11 - 26   Sodium 141 134 - 144 mmol/L   Potassium 4.2 3.5 - 5.2 mmol/L   Chloride 102 96 - 106 mmol/L   CO2 25 18 - 29 mmol/L   Calcium 9.2 8.7 - 10.3 mg/dL      Assessment & Plan:   Problem List Items Addressed This Visit      Digestive   GERD (gastroesophageal reflux disease)    We discussed that she could use the PPI once daily and add H2 blocker at night if needed; okay to use them both; avoid triggers; she says vit C worsens symptoms (which would have been helpful for her URI)        Other   Adverse drug reaction    Symptoms have passed and resolved; talked with patient about her experienced; apologized to her that I never received her message; I talked with the office manager about my not receiving her message; encouraged patient to file report with FDA using MedWatch site; reviewed the site with her and she'll complete from home; advised her to avoid gabapentin in the future and also Lyrica      Overweight (BMI 25.0-29.9)    Glad patient is having such success with her dieting       Other Visit Diagnoses    Viral upper respiratory tract infection    -  Primary    rest, hydration, green tea; she says vit C worsens GERD; I don't think antibiotics are necessary; call if worsening    Relevant Medications    Diphenhyd-Hydrocort-Nystatin (FIRST-DUKES MOUTHWASH) SUSP    Glossitis        medicated mouthwash; with symptoms extending to posterior OP, will have her swallow rather than just spit out; she's seen two ENTs; call if not resolving       Follow up plan: Return if symptoms worsen or fail to improve.  An after-visit summary was printed and given to the patient at check-out.  Please see the patient instructions which may contain other information and recommendations beyond what  is mentioned above in the assessment and plan.  Face-to-face time with patient was more than 25 minutes, >50% time spent counseling and coordination of care  Meds ordered this encounter  Medications  . albuterol (PROVENTIL HFA;VENTOLIN HFA) 108 (90 Base) MCG/ACT inhaler    Sig: Inhale 2 puffs into the lungs every 4 (four) hours as needed for wheezing or shortness of breath.    Dispense:  1 Inhaler  Refill:  1  . Diphenhyd-Hydrocort-Nystatin (FIRST-DUKES MOUTHWASH) SUSP    Sig: Use as directed 5 mLs in the mouth or throat every 4 (four) hours while awake. Swish 20 seconds then swallow; 5-7 days    Dispense:  237 mL    Refill:  0

## 2015-06-13 DIAGNOSIS — T50905A Adverse effect of unspecified drugs, medicaments and biological substances, initial encounter: Secondary | ICD-10-CM | POA: Insufficient documentation

## 2015-06-13 DIAGNOSIS — E663 Overweight: Secondary | ICD-10-CM | POA: Insufficient documentation

## 2015-06-13 NOTE — Assessment & Plan Note (Signed)
We discussed that she could use the PPI once daily and add H2 blocker at night if needed; okay to use them both; avoid triggers; she says vit C worsens symptoms (which would have been helpful for her URI)

## 2015-06-13 NOTE — Assessment & Plan Note (Addendum)
Glad patient is having such success with her dieting

## 2015-06-13 NOTE — Assessment & Plan Note (Signed)
Symptoms have passed and resolved; talked with patient about her experienced; apologized to her that I never received her message; I talked with the office manager about my not receiving her message; encouraged patient to file report with FDA using MedWatch site; reviewed the site with her and she'll complete from home; advised her to avoid gabapentin in the future and also Lyrica

## 2015-06-24 ENCOUNTER — Other Ambulatory Visit: Payer: Self-pay | Admitting: Family Medicine

## 2015-09-02 ENCOUNTER — Encounter: Payer: Self-pay | Admitting: Family Medicine

## 2015-09-07 ENCOUNTER — Ambulatory Visit (INDEPENDENT_AMBULATORY_CARE_PROVIDER_SITE_OTHER): Payer: 59 | Admitting: Family Medicine

## 2015-09-07 ENCOUNTER — Encounter: Payer: Self-pay | Admitting: Family Medicine

## 2015-09-07 VITALS — BP 132/68 | HR 77 | Temp 98.4°F | Resp 16 | Wt 165.0 lb

## 2015-09-07 DIAGNOSIS — T148 Other injury of unspecified body region: Secondary | ICD-10-CM

## 2015-09-07 DIAGNOSIS — R2242 Localized swelling, mass and lump, left lower limb: Secondary | ICD-10-CM | POA: Diagnosis not present

## 2015-09-07 DIAGNOSIS — W57XXXA Bitten or stung by nonvenomous insect and other nonvenomous arthropods, initial encounter: Secondary | ICD-10-CM | POA: Diagnosis not present

## 2015-09-07 NOTE — Progress Notes (Signed)
BP 132/68 mmHg  Pulse 77  Temp(Src) 98.4 F (36.9 C) (Oral)  Resp 16  Wt 165 lb (74.844 kg)  SpO2 96%   Subjective:    Patient ID: Olivia Morgan, female    DOB: [birth year], 60 y.o.   MRN: [id number]  HPI: Olivia Morgan is a 60 y.o. female  Chief Complaint  Patient presents with  . Cyst    vs knot found last thursday feels like a marble  . Insect Bite    tick left deltoid, yesterday   She had a tick bite on the back of her neck, stayed red and it was bothering her for 3 weeks, derm was booked out; then went to see another provider and they managed it; the one on the back of the neck has resolved She has another bite on the stomach, reddened; using clobetasol foam, sample given by other office No fevers, has had a little neck soreness, tilting head back, nothing significant; she had viral meningitis years ago; no rash on the arms; no severe headache  She has a cyst, had tingling in her toes, back of the left leg; now has a palpable cyst or some swelling on the front of her left leg; not sure how long it's been there since she has only just noticed it; not pfainul The left foot had tingling; toes 2 through 4 are sore to flex them; going to see foot doctor in a few weeks Her leg was tingling and then she felt a place on the left shin, feels a knot on the shin   Depression screen PHQ 2/9 09/07/2015  Decreased Interest 0  Down, Depressed, Hopeless 0  PHQ - 2 Score 0   Relevant past medical, surgical, family and social history reviewed Past Medical History  Diagnosis Date  . Herpes simplex virus infection   . OA (osteoarthritis) of hip     left  . GERD (gastroesophageal reflux disease)   . Plantar fasciitis   . Depression   . Stomatitis   . Genital herpes   . Bilateral hearing loss     wears hearing aids  . Murmur   . Neuropathy of both feet (HCC) 06/05/2015   Past Surgical History  Procedure Laterality Date  . Cesarean section    . Total hip arthroplasty Left  10/2008  . [facility] total [facility]  3/13    [facility]  . Foot surgery Right   . Upper gi endoscopy  1992  . Colonoscopy  2010   Family History  Problem Relation Age of Onset  . Aneurysm Mother   . Stroke Mother   . Heart disease Father [id number]  . Hypertension Father   . Lung disease Father   . Heart disease Paternal Grandfather   . Hypertension Brother   . Cancer Neg Hx   . COPD Neg Hx   . Diabetes Neg Hx    Social History  Substance Use Topics  . Smoking status: Never Smoker   . Smokeless tobacco: Never Used  . Alcohol Use: Yes     Comment: socially   Interim medical history since last visit reviewed. Allergies and medications reviewed  Review of Systems Per HPI unless specifically indicated above     Objective:    BP 132/68 mmHg  Pulse 77  Temp(Src) 98.4 F (36.9 C) (Oral)  Resp 16  Wt 165 lb (74.844 kg)  SpO2 96%  Wt Readings from Last 3 Encounters:  09/07/15 165 lb (74.844 kg)  06/11/15 167 lb (75.751 kg)  05/20/15 171 lb (77.565 kg)    Physical Exam  Constitutional: She appears well-developed and well-nourished. No distress.  Cardiovascular: Normal rate.   Pulmonary/Chest: Effort normal.  Musculoskeletal:       Left lower leg: She exhibits swelling (palpable mass under the skin, about the size of a small grape, mobile, a little firmer than lipoma consistency; no overlying skin changes). She exhibits no tenderness.  Skin: Lesion (bright red papular lesion just left of umbilicus, no drainage; smaller erythematous lesion left deltoid with pinpoint eschar, no visible tick fragments seen) noted. No pallor.  Several spider veins on the left shin, not on the right  Psychiatric: Her mood appears not anxious. She does not exhibit a depressed mood.      Assessment & Plan:   Problem List Items Addressed This Visit      Other   Mass of left lower leg - Primary    Palpable swelling; ddx includes lipoma, but need to r/o worrisome tumor given its  depth; start with [place]; consider MRI with and without after [place] results are back      Relevant Orders   [place] Misc Soft Tissue    Other Visit Diagnoses    Tick bites        nothing worrisome today to warrant starting doxy; discussed s/s of RMSF, reasons to call right away; advised use of mosquito and tick repellent        Follow up plan: No Follow-up on file.  An after-visit summary was printed and given to the patient at check-out.  Please see the patient instructions which may contain other information and recommendations beyond what is mentioned above in the assessment and plan.  No orders of the defined types were placed in this encounter.    Orders Placed This Encounter  Procedures  . [place] Misc Soft Tissue

## 2015-09-07 NOTE — Assessment & Plan Note (Signed)
Palpable swelling; ddx includes lipoma, but need to r/o worrisome tumor given its depth; start with US; consider MRI with and without after US results are back

## 2015-09-07 NOTE — Patient Instructions (Signed)
Wear tick/mosquito repellent Check for ticks every night We'll get the knot on your left leg scanned

## 2015-09-14 ENCOUNTER — Ambulatory Visit
Admission: RE | Admit: 2015-09-14 | Discharge: 2015-09-14 | Disposition: A | Discharge status: Home / Self Care | Payer: 59 | Source: Ambulatory Visit | Attending: Family Medicine | Admitting: Family Medicine

## 2015-09-14 DIAGNOSIS — R2242 Localized swelling, mass and lump, left lower limb: Secondary | ICD-10-CM | POA: Insufficient documentation

## 2015-09-15 ENCOUNTER — Telehealth: Payer: Self-pay

## 2015-09-15 DIAGNOSIS — R2242 Localized swelling, mass and lump, left lower limb: Secondary | ICD-10-CM

## 2015-09-15 NOTE — Telephone Encounter (Signed)
Please review pt labs

## 2015-09-16 ENCOUNTER — Telehealth: Payer: Self-pay | Admitting: Family Medicine

## 2015-09-16 NOTE — Assessment & Plan Note (Signed)
Three discrete masses seen on US; ordering MRI

## 2015-09-16 NOTE — Telephone Encounter (Signed)
I don't think we did any labs; she probably called for results of her ultrasound Please let her know that the US actually identified three discrete masses, not just the one; they don't know what they are and recommend getting an MRI; I'll order that now and we'll see what that shows, then likely refer her to a surgeon to have them removed Don't lose sleep over these, because we don't know what they are, and could simply be lipomas, but we obviously need to work this up (I entered two different tests because I don't know which is the one they'll prefer; just cancel the one they don't want; thank you)

## 2015-09-16 NOTE — Telephone Encounter (Signed)
I spoke with clinical reviewer; approved CC [id number] Good for 45 days, expires 10/31/2015

## 2015-09-16 NOTE — Telephone Encounter (Signed)
Pt notified MRI June 16 @ 2:30 at hospital

## 2015-09-20 ENCOUNTER — Ambulatory Visit (INDEPENDENT_AMBULATORY_CARE_PROVIDER_SITE_OTHER): Payer: 59 | Admitting: Podiatry

## 2015-09-20 ENCOUNTER — Ambulatory Visit (INDEPENDENT_AMBULATORY_CARE_PROVIDER_SITE_OTHER): Payer: 59

## 2015-09-20 ENCOUNTER — Encounter: Payer: Self-pay | Admitting: Podiatry

## 2015-09-20 DIAGNOSIS — G5762 Lesion of plantar nerve, left lower limb: Secondary | ICD-10-CM

## 2015-09-20 DIAGNOSIS — M79673 Pain in unspecified foot: Secondary | ICD-10-CM

## 2015-09-20 NOTE — Progress Notes (Signed)
   Subjective:    Patient ID: Olivia Morgan, female    DOB: [birth year], 60 y.o.   MRN: [id number]  HPI: She presents today with a chief complaint of pain to the last 3 toes of her left foot. She states that the pain has been present for quite some time. She states that she has some pain to the hallux is possibly associated with a hip replacement or knee replacement left. She relates some numbness and tingling to the hallux left with sitting or standing however when she is lying down he goes away. She had a posterior total hip replacement. She denies any trauma to the left foot. The right foot she states that she had a Lapidus procedure on the second third metatarsal osteotomies hammertoe repair second with pin and she was nonweightbearing for an extended period of time. She states that the bunion slowly came back over. At time. She'll like to consider surgical correction of this.    Review of Systems  HENT: Positive for hearing loss and sinus pressure.        Objective:   Physical Exam: Vital signs are stable she is alert and oriented 3 pulses are palpable. Neurologic sensorium is intact. Deep tendon reflexes are intact. Muscle strength is normal bilateral. Orthopedic evaluation demonstrates hallux abductovalgus deformity of the right foot with hammertoe deformity third digit right foot. Radiographs taken today do demonstrate what appears to be a Lapidus procedure dental screw fixation and Akin osteotomy hallux right second and third metatarsal and hammertoe repair second right which is gone on to heal uneventfully. Left foot does demonstrates pain on palpation third interdigital space of the left foot. Is consistent with a neuroma. No open lesions or wounds are noted.        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 Hammertoe third right.  Plan: At this point we discussed in great detail today repair of the bunion and hammertoe  right foot. She would like to do this in the fall. I injected her left third interdigital space today with Kenalog and local anesthetic I will follow up with her in 1 month's to reevaluate a neuroma and consider dehydrated alcohol.

## 2015-09-21 ENCOUNTER — Telehealth: Payer: Self-pay | Admitting: Family Medicine

## 2015-09-21 MED ORDER — HYDROXYZINE PAMOATE 25 MG PO CAPS: ORAL_CAPSULE | ORAL | Status: AC

## 2015-09-21 NOTE — Telephone Encounter (Signed)
Patient is having a procedure Friday. You had offered her something to help her relax and she declined. She just found out that they will be taking blood and doing an IV. Patient has changed her mind and would like something to keep her calm. Also wanted to remind you that she is not able to take anything in the family of Valium due to side effects. [facility] uses [facility] [address]

## 2015-09-21 NOTE — Telephone Encounter (Signed)
Okay, I can get her something that psychiatrists prescribe for anxiety that is in the benadryl family; it can be sedative, though, so she should not drive herself to the procedure; no driving for six hours after taking the medicine Thank you

## 2015-09-23 ENCOUNTER — Other Ambulatory Visit: Payer: Self-pay | Admitting: Family Medicine

## 2015-09-24 ENCOUNTER — Ambulatory Visit
Admission: RE | Admit: 2015-09-24 | Discharge: 2015-09-24 | Disposition: A | Discharge status: Home / Self Care | Payer: 59 | Source: Ambulatory Visit | Attending: Family Medicine | Admitting: Family Medicine

## 2015-09-24 DIAGNOSIS — R938 Abnormal findings on diagnostic imaging of other specified body structures: Secondary | ICD-10-CM | POA: Insufficient documentation

## 2015-09-24 DIAGNOSIS — R2242 Localized swelling, mass and lump, left lower limb: Secondary | ICD-10-CM | POA: Diagnosis present

## 2015-09-24 MED ORDER — GADOBENATE DIMEGLUMINE 529 MG/ML IV SOLN
15.0000 mL | Freq: Once | INTRAVENOUS | Status: AC | PRN
  Administered 2015-09-24: 15 mL via INTRAVENOUS

## 2015-09-27 ENCOUNTER — Telehealth: Payer: Self-pay

## 2015-09-27 NOTE — Telephone Encounter (Signed)
Please review pt MRI

## 2015-09-27 NOTE — Telephone Encounter (Signed)
Please let pt know that there is nothing that looks like cancer; it appears to be fat necrosis, not cancer The other possibility on scan is infection, but that doesn't correlate clinically (doesn't go with the appearance or story) I would suggest that she just watch these places If getting larger or symptomatic at all, then we'll refer her to a general surgeon

## 2015-09-28 ENCOUNTER — Telehealth: Payer: Self-pay | Admitting: Family Medicine

## 2015-09-28 NOTE — Telephone Encounter (Signed)
Pt.notified

## 2015-09-28 NOTE — Telephone Encounter (Signed)
Checking status on MRI results

## 2015-10-14 ENCOUNTER — Other Ambulatory Visit: Payer: Self-pay | Admitting: Family Medicine

## 2015-10-18 ENCOUNTER — Other Ambulatory Visit: Payer: Self-pay

## 2015-10-18 MED ORDER — VALACYCLOVIR HCL 500 MG PO TABS: 500.0000 mg | ORAL_TABLET | Freq: Every day | ORAL | Status: DC

## 2015-10-25 ENCOUNTER — Ambulatory Visit (INDEPENDENT_AMBULATORY_CARE_PROVIDER_SITE_OTHER): Payer: 59 | Admitting: Podiatry

## 2015-10-25 ENCOUNTER — Encounter: Payer: Self-pay | Admitting: Podiatry

## 2015-10-25 VITALS — BP 100/64 | HR 64 | Resp 12

## 2015-10-25 DIAGNOSIS — G5762 Lesion of plantar nerve, left lower limb: Secondary | ICD-10-CM | POA: Diagnosis not present

## 2015-10-25 NOTE — Progress Notes (Signed)
She presents today for follow-up of neuroma third interdigital space of the left foot. She states it is still not well.  Objective: Vital signs are stable alert and oriented 3. Pulses are palpable. Palpable Mulder's click third interdigital space left foot.  Assessment: Neuroma third interspace left foot.  Plan: I injected her left third interdigital space today with dehydrated alcohol and no follow-up with her in 3 weeks. This was her first dose of dehydrated alcohol.

## 2015-11-11 ENCOUNTER — Encounter: Payer: Self-pay | Admitting: Family Medicine

## 2015-11-11 ENCOUNTER — Ambulatory Visit (INDEPENDENT_AMBULATORY_CARE_PROVIDER_SITE_OTHER): Payer: 59 | Admitting: Family Medicine

## 2015-11-11 VITALS — BP 124/82 | HR 87 | Temp 97.6°F | Resp 14 | Ht 63.3 in | Wt 164.0 lb

## 2015-11-11 DIAGNOSIS — E559 Vitamin D deficiency, unspecified: Secondary | ICD-10-CM | POA: Diagnosis not present

## 2015-11-11 DIAGNOSIS — Z0000 Encounter for general adult medical examination without abnormal findings: Secondary | ICD-10-CM | POA: Insufficient documentation

## 2015-11-11 DIAGNOSIS — G5793 Unspecified mononeuropathy of bilateral lower limbs: Secondary | ICD-10-CM | POA: Diagnosis not present

## 2015-11-11 DIAGNOSIS — M8580 Other specified disorders of bone density and structure, unspecified site: Secondary | ICD-10-CM | POA: Insufficient documentation

## 2015-11-11 DIAGNOSIS — D696 Thrombocytopenia, unspecified: Secondary | ICD-10-CM

## 2015-11-11 NOTE — Progress Notes (Signed)
Patient ID: Olivia Morgan, female   DOB: 1955-05-22, 60 y.o.   MRN: [id number]   Subjective:   Olivia Morgan is a 60 y.o. female here for a complete physical exam  USPSTF grade A and B recommendations Alcohol: less than 7 per week Depression:  Depression screen [facility] 2/9 11/11/2015 09/07/2015  Decreased Interest 0 0  Down, Depressed, Hopeless 0 0  PHQ - 2 Score 0 0   Hypertension: well-controlled Obesity: no; working on weight loss Tobacco use: nonsmoker HIV, hep B, hep C: declined STD testing and prevention (chl/gon/syphilis): declined Lipids: today Glucose: today Colorectal cancer: 2020 due Breast cancer: through GYN BRCA gene screening: no Intimate partner violence: no Cervical cancer screening: through GYN Lung cancer: n/a Osteoporosis: had a scan 2010, osteopenia; order Fall prevention/vitamin D:  AAA: n/a Aspirin: not taking Diet: room for improvement but losing weight, limiting carbs Exercise: limited by foot issue and knee; was working out a few times a week, hard to walk now; had foot injection; measuring steps on the phone Skin cancer: red place on the left tip of the nose; scaly place left thigh; dark plaque on the right zygomatic arch  Past Medical History:  Diagnosis Date  . Bilateral hearing loss    wears hearing aids  . Depression   . Genital herpes   . GERD (gastroesophageal reflux disease)   . Herpes simplex virus infection   . Murmur   . Neuropathy of both feet ([place]) 06/05/2015  . OA (osteoarthritis) of hip    left  . Plantar fasciitis   . Stomatitis    Past Surgical History:  Procedure Laterality Date  . CESAREAN SECTION    . COLONOSCOPY  2010  . FOOT SURGERY Right   . REPLACEMENT TOTAL KNEE  3/13   Dr.Hallows at [place] Left 10/2008  . UPPER GI ENDOSCOPY  1992   Family History  Problem Relation Age of Onset  . Aneurysm Mother   . Stroke Mother   . Heart disease Father 58  . Hypertension Father   .  Lung disease Father   . Heart disease Paternal Grandfather   . Hypertension Brother   . Cancer Neg Hx   . COPD Neg Hx   . Diabetes Neg Hx    Social History  Substance Use Topics  . Smoking status: Never Smoker  . Smokeless tobacco: Never Used  . Alcohol use Yes     Comment: socially   Review of Systems  Allergic/Immunologic: Positive for environmental allergies (between nose and throat, taking allergy pills).   Objective:   Vitals:   11/11/15 1409  BP: 124/82  Pulse: 87  Resp: 14  Temp: 97.6 F (36.4 C)  TempSrc: Oral  SpO2: 93%  Weight: 164 lb (74.4 kg)  Height: 5' 3.3" (1.608 m)   Body mass index is 28.78 kg/m. Wt Readings from Last 3 Encounters:  11/11/15 164 lb (74.4 kg)  09/07/15 165 lb (74.8 kg)  06/11/15 167 lb (75.8 kg)   Physical Exam  Constitutional: She appears well-developed and well-nourished.  HENT:  Head: Normocephalic and atraumatic.  Right Ear: Hearing, tympanic membrane, external ear and ear canal normal.  Left Ear: Hearing, tympanic membrane, external ear and ear canal normal.  Eyes: Conjunctivae and EOM are normal. Right eye exhibits no hordeolum. Left eye exhibits no hordeolum. No scleral icterus.  Neck: Carotid bruit is not present. No thyromegaly present.  Cardiovascular: Normal rate, regular rhythm, S1 normal, S2 normal  and normal heart sounds.   No extrasystoles are present.  Pulmonary/Chest: Effort normal and breath sounds normal. No respiratory distress.  Abdominal: Soft. Normal appearance and bowel sounds are normal. She exhibits no distension, no abdominal bruit, no pulsatile midline mass and no mass. There is no hepatosplenomegaly. There is no tenderness. No hernia.  Musculoskeletal: Normal range of motion. She exhibits no edema.  Lymphadenopathy:       Head (right side): No submandibular adenopathy present.       Head (left side): No submandibular adenopathy present.    She has no cervical adenopathy.  Neurological: She is alert.  She displays no tremor. No cranial nerve deficit. She exhibits normal muscle tone. Gait normal.  Reflex Scores:      Patellar reflexes are 2+ on the right side and 2+ on the left side. Skin: Skin is warm and dry. No bruising and no ecchymosis noted. No cyanosis. No pallor.  Psychiatric: Her speech is normal and behavior is normal. Thought content normal. Her mood appears not anxious. She does not exhibit a depressed mood.    Assessment/Plan:   Problem List Items Addressed This Visit      Nervous and Auditory   Neuropathy of both feet (HCC)    Check B12 and TSH      Relevant Orders   Vitamin B12 (Completed)   TSH (Completed)     Musculoskeletal and Integument   Osteopenia    Order DEXA      Relevant Orders   DG Bone Density     Other   Vitamin D deficiency    Check level today per patient request; taking supplement      Relevant Orders   VITAMIN D 25 Hydroxy (Vit-D Deficiency, Fractures) (Completed)   Thrombocytopenia (HCC)    Check CBC      Relevant Orders   CBC with Differential/Platelet (Completed)   Preventative health care - Primary    GYN components done through GYN office; will do remainder USPSTF grade A and B recommendations reviewed with patient; age-appropriate recommendations, preventive care, screening tests, etc discussed and encouraged; healthy living encouraged; see AVS for patient education given to patient      Relevant Orders   LP+Creat+Hb A1c   Glucose (Completed)    Other Visit Diagnoses   None.      Meds ordered this encounter  Medications  . diphenhydrAMINE (BENADRYL) 25 MG tablet    Sig: Take 25 mg by mouth every 6 (six) hours as needed.   Orders Placed This Encounter  Procedures  . DG Bone Density    Order Specific Question:   Reason for Exam (SYMPTOM  OR DIAGNOSIS REQUIRED)    Answer:   osteopenia in 2010, T score spine -1.7    Order Specific Question:   Preferred imaging location?    Answer:   [facility]  .  LP+Creat+Hb A1c  . VITAMIN D 25 Hydroxy (Vit-D Deficiency, Fractures)  . Vitamin B12  . Glucose  . TSH  . CBC with Differential/Platelet  . LP+Creat+Hb A1c   Follow up plan: Return in about 1 year (around 11/10/2016) for complete physical.  An After Visit Summary was printed and given to the patient.

## 2015-11-11 NOTE — Assessment & Plan Note (Signed)
Check CBC 

## 2015-11-11 NOTE — Assessment & Plan Note (Signed)
Check B12 and TSH

## 2015-11-11 NOTE — Assessment & Plan Note (Signed)
Check level today per patient request; taking supplement

## 2015-11-11 NOTE — Patient Instructions (Addendum)
Let's get labs Please do call to schedule your bone density study; the number to schedule one at either [facility] or [facility] is [phone]  Health Maintenance, Female Adopting a healthy lifestyle and getting preventive care can go a long way to promote health and wellness. Talk with your health care provider about what schedule of regular examinations is right for you. This is a good chance for you to check in with your provider about disease prevention and staying healthy. In between checkups, there are plenty of things you can do on your own. Experts have done a lot of research about which lifestyle changes and preventive measures are most likely to keep you healthy. Ask your health care provider for more information. WEIGHT AND DIET  Eat a healthy diet  Be sure to include plenty of vegetables, fruits, low-fat dairy products, and lean protein.  Do not eat a lot of foods high in solid fats, added sugars, or salt.  Get regular exercise. This is one of the most important things you can do for your health.  Most adults should exercise for at least 150 minutes each week. The exercise should increase your heart rate and make you sweat (moderate-intensity exercise).  Most adults should also do strengthening exercises at least twice a week. This is in addition to the moderate-intensity exercise.  Maintain a healthy weight  Body mass index (BMI) is a measurement that can be used to identify possible weight problems. It estimates body fat based on height and weight. Your health care provider can help determine your BMI and help you achieve or maintain a healthy weight.  For females 77 years of age and older:   A BMI below 18.5 is considered underweight.  A BMI of 18.5 to 24.9 is normal.  A BMI of 25 to 29.9 is considered overweight.  A BMI of 30 and above is considered obese.  Watch levels of cholesterol and blood lipids  You should start having your  blood tested for lipids and cholesterol at 61 years of age, then have this test every 5 years.  You may need to have your cholesterol levels checked more often if:  Your lipid or cholesterol levels are high.  You are older than 61 years of age.  You are at high risk for heart disease.  CANCER SCREENING   Lung Cancer  Lung cancer screening is recommended for adults 87-21 years old who are at high risk for lung cancer because of a history of smoking.  A yearly low-dose CT scan of the lungs is recommended for people who:  Currently smoke.  Have quit within the past 15 years.  Have at least a 30-pack-year history of smoking. A pack year is smoking an average of one pack of cigarettes a day for 1 year.  Yearly screening should continue until it has been 15 years since you quit.  Yearly screening should stop if you develop a health problem that would prevent you from having lung cancer treatment.  Breast Cancer  Practice breast self-awareness. This means understanding how your breasts normally appear and feel.  It also means doing regular breast self-exams. Let your health care provider know about any changes, no matter how small.  If you are in your 20s or 30s, you should have a clinical breast exam (CBE) by a health care provider every 1-3 years as part of a regular health exam.  If you are 76 or older, have a CBE every year. Also  consider having a breast X-ray (mammogram) every year.  If you have a family history of breast cancer, talk to your health care provider about genetic screening.  If you are at high risk for breast cancer, talk to your health care provider about having an MRI and a mammogram every year.  Breast cancer gene (BRCA) assessment is recommended for women who have family members with BRCA-related cancers. BRCA-related cancers include:  Breast.  Ovarian.  Tubal.  Peritoneal cancers.  Results of the assessment will determine the need for genetic  counseling and BRCA1 and BRCA2 testing. Cervical Cancer Your health care provider may recommend that you be screened regularly for cancer of the pelvic organs (ovaries, uterus, and vagina). This screening involves a pelvic examination, including checking for microscopic changes to the surface of your cervix (Pap test). You may be encouraged to have this screening done every 3 years, beginning at age 85.  For women ages 66-65, health care providers may recommend pelvic exams and Pap testing every 3 years, or they may recommend the Pap and pelvic exam, combined with testing for human papilloma virus (HPV), every 5 years. Some types of HPV increase your risk of cervical cancer. Testing for HPV may also be done on women of any age with unclear Pap test results.  Other health care providers may not recommend any screening for nonpregnant women who are considered low risk for pelvic cancer and who do not have symptoms. Ask your health care provider if a screening pelvic exam is right for you.  If you have had past treatment for cervical cancer or a condition that could lead to cancer, you need Pap tests and screening for cancer for at least 20 years after your treatment. If Pap tests have been discontinued, your risk factors (such as having a new sexual partner) need to be reassessed to determine if screening should resume. Some women have medical problems that increase the chance of getting cervical cancer. In these cases, your health care provider may recommend more frequent screening and Pap tests. Colorectal Cancer  This type of cancer can be detected and often prevented.  Routine colorectal cancer screening usually begins at 61 years of age and continues through 61 years of age.  Your health care provider may recommend screening at an earlier age if you have risk factors for colon cancer.  Your health care provider may also recommend using home test kits to check for hidden blood in the stool.  A  small camera at the end of a tube can be used to examine your colon directly (sigmoidoscopy or colonoscopy). This is done to check for the earliest forms of colorectal cancer.  Routine screening usually begins at age 41.  Direct examination of the colon should be repeated every 5-10 years through 61 years of age. However, you may need to be screened more often if early forms of precancerous polyps or small growths are found. Skin Cancer  Check your skin from head to toe regularly.  Tell your health care provider about any new moles or changes in moles, especially if there is a change in a mole's shape or color.  Also tell your health care provider if you have a mole that is larger than the size of a pencil eraser.  Always use sunscreen. Apply sunscreen liberally and repeatedly throughout the day.  Protect yourself by wearing long sleeves, pants, a wide-brimmed hat, and sunglasses whenever you are outside. HEART DISEASE, DIABETES, AND HIGH BLOOD PRESSURE   High  blood pressure causes heart disease and increases the risk of stroke. High blood pressure is more likely to develop in:  People who have blood pressure in the high end of the normal range (130-139/85-89 mm Hg).  People who are overweight or obese.  People who are African American.  If you are 46-75 years of age, have your blood pressure checked every 3-5 years. If you are 37 years of age or older, have your blood pressure checked every year. You should have your blood pressure measured twice--once when you are at a hospital or clinic, and once when you are not at a hospital or clinic. Record the average of the two measurements. To check your blood pressure when you are not at a hospital or clinic, you can use:  An automated blood pressure machine at a pharmacy.  A home blood pressure monitor.  If you are between 54 years and 18 years old, ask your health care provider if you should take aspirin to prevent strokes.  Have  regular diabetes screenings. This involves taking a blood sample to check your fasting blood sugar level.  If you are at a normal weight and have a low risk for diabetes, have this test once every three years after 61 years of age.  If you are overweight and have a high risk for diabetes, consider being tested at a younger age or more often. PREVENTING INFECTION  Hepatitis B  If you have a higher risk for hepatitis B, you should be screened for this virus. You are considered at high risk for hepatitis B if:  You were born in a country where hepatitis B is common. Ask your health care provider which countries are considered high risk.  Your parents were born in a high-risk country, and you have not been immunized against hepatitis B (hepatitis B vaccine).  You have HIV or AIDS.  You use needles to inject street drugs.  You live with someone who has hepatitis B.  You have had sex with someone who has hepatitis B.  You get hemodialysis treatment.  You take certain medicines for conditions, including cancer, organ transplantation, and autoimmune conditions. Hepatitis C  Blood testing is recommended for:  Everyone born from [birth year] through 1966.  Anyone with known risk factors for hepatitis C. Sexually transmitted infections (STIs)  You should be screened for sexually transmitted infections (STIs) including gonorrhea and chlamydia if:  You are sexually active and are younger than 61 years of age.  You are older than 61 years of age and your health care provider tells you that you are at risk for this type of infection.  Your sexual activity has changed since you were last screened and you are at an increased risk for chlamydia or gonorrhea. Ask your health care provider if you are at risk.  If you do not have HIV, but are at risk, it may be recommended that you take a prescription medicine daily to prevent HIV infection. This is called pre-exposure prophylaxis (PrEP). You are  considered at risk if:  You are sexually active and do not regularly use condoms or know the HIV status of your partner(s).  You take drugs by injection.  You are sexually active with a partner who has HIV. Talk with your health care provider about whether you are at high risk of being infected with HIV. If you choose to begin PrEP, you should first be tested for HIV. You should then be tested every 3 months for as long  as you are taking PrEP.  PREGNANCY   If you are premenopausal and you may become pregnant, ask your health care provider about preconception counseling.  If you may become pregnant, take 400 to 800 micrograms (mcg) of folic acid every day.  If you want to prevent pregnancy, talk to your health care provider about birth control (contraception). OSTEOPOROSIS AND MENOPAUSE   Osteoporosis is a disease in which the bones lose minerals and strength with aging. This can result in serious bone fractures. Your risk for osteoporosis can be identified using a bone density scan.  If you are 84 years of age or older, or if you are at risk for osteoporosis and fractures, ask your health care provider if you should be screened.  Ask your health care provider whether you should take a calcium or vitamin D supplement to lower your risk for osteoporosis.  Menopause may have certain physical symptoms and risks.  Hormone replacement therapy may reduce some of these symptoms and risks. Talk to your health care provider about whether hormone replacement therapy is right for you.  HOME CARE INSTRUCTIONS   Schedule regular health, dental, and eye exams.  Stay current with your immunizations.   Do not use any tobacco products including cigarettes, chewing tobacco, or electronic cigarettes.  If you are pregnant, do not drink alcohol.  If you are breastfeeding, limit how much and how often you drink alcohol.  Limit alcohol intake to no more than 1 drink per day for nonpregnant women. One  drink equals 12 ounces of beer, 5 ounces of wine, or 1 ounces of hard liquor.  Do not use street drugs.  Do not share needles.  Ask your health care provider for help if you need support or information about quitting drugs.  Tell your health care provider if you often feel depressed.  Tell your health care provider if you have ever been abused or do not feel safe at home.   This information is not intended to replace advice given to you by your health care provider. Make sure you discuss any questions you have with your health care provider.   Document Released: 10/10/2010 Document Revised: 04/17/2014 Document Reviewed: 02/26/2013 Elsevier Interactive Patient Education [employer].

## 2015-11-11 NOTE — Assessment & Plan Note (Signed)
Order DEXA 

## 2015-11-11 NOTE — Assessment & Plan Note (Signed)
GYN components done through GYN office; will do remainder USPSTF grade A and B recommendations reviewed with patient; age-appropriate recommendations, preventive care, screening tests, etc discussed and encouraged; healthy living encouraged; see AVS for patient education given to patient

## 2015-11-12 LAB — LP+CREAT+HB A1C
CHOLESTEROL TOTAL: 162 mg/dL (ref 100–199)
CREATININE: 0.83 mg/dL (ref 0.57–1.00)
Chol/HDL Ratio: 2.3 ratio units (ref 0.0–4.4)
GFR, EST AFRICAN AMERICAN: 88 mL/min/{1.73_m2} (ref >59)
GFR, EST NON AFRICAN AMERICAN: 76 mL/min/{1.73_m2} (ref >59)
HDL: 69 mg/dL (ref >39)
Hgb A1c MFr Bld: 5.1 % (ref 4.8–5.6)
LDL Calculated: 66 mg/dL (ref 0–99)
TRIGLYCERIDES: 135 mg/dL (ref 0–149)
VLDL Cholesterol Cal: 27 mg/dL (ref 5–40)

## 2015-11-12 LAB — CBC WITH DIFFERENTIAL/PLATELET
BASOS: 0 %
Basophils Absolute: 0 10*3/uL (ref 0.0–0.2)
EOS (ABSOLUTE): 0.1 10*3/uL (ref 0.0–0.4)
EOS: 2 %
HEMATOCRIT: 44 % (ref 34.0–46.6)
Hemoglobin: 15.7 g/dL (ref 11.1–15.9)
IMMATURE GRANS (ABS): 0 10*3/uL (ref 0.0–0.1)
IMMATURE GRANULOCYTES: 0 %
LYMPHS: 17 %
Lymphocytes Absolute: 0.9 10*3/uL (ref 0.7–3.1)
MCH: 34.1 pg — AB (ref 26.6–33.0)
MCHC: 35.7 g/dL (ref 31.5–35.7)
MCV: 95 fL (ref 79–97)
Monocytes Absolute: 0.5 10*3/uL (ref 0.1–0.9)
Monocytes: 9 %
NEUTROS ABS: 3.9 10*3/uL (ref 1.4–7.0)
NEUTROS PCT: 72 %
Platelets: 156 10*3/uL (ref 150–379)
RBC: 4.61 x10E6/uL (ref 3.77–5.28)
RDW: 13.8 % (ref 12.3–15.4)
WBC: 5.4 10*3/uL (ref 3.4–10.8)

## 2015-11-12 LAB — VITAMIN D 25 HYDROXY (VIT D DEFICIENCY, FRACTURES): VIT D 25 HYDROXY: 57.5 ng/mL (ref 30.0–100.0)

## 2015-11-12 LAB — GLUCOSE, RANDOM: GLUCOSE: 91 mg/dL (ref 65–99)

## 2015-11-12 LAB — TSH: TSH: 2.57 u[IU]/mL (ref 0.450–4.500)

## 2015-11-12 LAB — VITAMIN B12: VITAMIN B 12: 727 pg/mL (ref 211–946)

## 2015-11-15 ENCOUNTER — Encounter: Payer: Self-pay | Admitting: Podiatry

## 2015-11-15 ENCOUNTER — Ambulatory Visit (INDEPENDENT_AMBULATORY_CARE_PROVIDER_SITE_OTHER): Payer: 59 | Admitting: Podiatry

## 2015-11-15 DIAGNOSIS — G5762 Lesion of plantar nerve, left lower limb: Secondary | ICD-10-CM

## 2015-11-15 DIAGNOSIS — M79673 Pain in unspecified foot: Secondary | ICD-10-CM

## 2015-11-15 NOTE — Progress Notes (Signed)
She presents today for a follow-up of her neuroma third interdigital space left foot. She states that her left foot is doing a little bit better since her first injection of dehydrated alcohol. She also states that she will have a partial knee replacement right. This will be done in late August. She would like to have her right foot surgically corrected within the next couple of months.  Objective: Vital signs are stable alert and oriented 3. Pulses are palpable. Neurologic sensorium is intact. Deep tendon reflexes are intact. Palpable neuroma third interdigital space left foot. Pulses are palpable. No open lesions or wounds.  Assessment: Neuroma third interspace left foot.  Plan: Injected her second dose of dehydrated alcohol today. Follow up with her once her knee replacement is complete.

## 2015-11-18 DIAGNOSIS — E669 Obesity, unspecified: Secondary | ICD-10-CM | POA: Insufficient documentation

## 2015-11-18 DIAGNOSIS — F341 Dysthymic disorder: Secondary | ICD-10-CM | POA: Insufficient documentation

## 2015-12-16 DIAGNOSIS — Z96651 Presence of right artificial knee joint: Secondary | ICD-10-CM | POA: Insufficient documentation

## 2015-12-21 ENCOUNTER — Ambulatory Visit: Payer: 59 | Attending: Orthopaedic Surgery | Admitting: Physical Therapy

## 2015-12-21 DIAGNOSIS — M6281 Muscle weakness (generalized): Secondary | ICD-10-CM | POA: Diagnosis present

## 2015-12-21 DIAGNOSIS — R262 Difficulty in walking, not elsewhere classified: Secondary | ICD-10-CM | POA: Diagnosis present

## 2015-12-21 DIAGNOSIS — M25561 Pain in right knee: Secondary | ICD-10-CM | POA: Insufficient documentation

## 2015-12-21 NOTE — Therapy (Signed)
[place] [facility] [address] [place], [address] Phone: [phone]   Fax:  [phone]  Physical Therapy Evaluation  Patient Details  Name: Olivia Morgan MRN: [id number] Date of Birth: [birth year] Referring Provider: hallows  Encounter Date: 12/21/2015      PT End of Session - 12/21/15 1538    Visit Number 1   Number of Visits 13   Date for PT Re-Evaluation 02/01/16   PT Start Time 1345   PT Stop Time 1445   PT Time Calculation (min) 60 min   Activity Tolerance Patient tolerated treatment well   Behavior During Therapy [facility] for tasks assessed/performed      Past Medical History:  Diagnosis Date  . Bilateral hearing loss    wears hearing aids  . Depression   . Genital herpes   . GERD (gastroesophageal reflux disease)   . Herpes simplex virus infection   . Murmur   . Neuropathy of both feet (HCC) 06/05/2015  . OA (osteoarthritis) of hip    left  . Plantar fasciitis   . Stomatitis     Past Surgical History:  Procedure Laterality Date  . CESAREAN SECTION    . COLONOSCOPY  2010  . FOOT SURGERY Right   . REPLACEMENT TOTAL KNEE  3/13   Dr[facility]Hallows at [facility]  . TOTAL HIP ARTHROPLASTY Left 10/2008  . UPPER GI ENDOSCOPY  1992    There were no vitals filed for this visit.       Subjective Assessment - 12/21/15 1537    Subjective Pt had R partial TKA 8/23. She had significant medial knee pain prior to this. Pt reports prior to her surgery she was limited in her activity and had stopped going to the gym due to pain.   How long can you sit comfortably? sitting for greater than 20 min incr. pain   Diagnostic tests imaging pre and post surgery   Patient Stated Goals return to normal level of activities. stairs, dancing.   Currently in Pain? Yes   Pain Score 2    Pain Location Knee            [facility] PT Assessment - 12/21/15 0001      Assessment   Medical Diagnosis right partial knee  arthroplasty aftercare   Referring Provider hallows   Onset Date/Surgical Date 12/01/15   Next MD Visit 01/10/2016   Prior Therapy home health     Precautions   Precautions None     Restrictions   Weight Bearing Restrictions No     Balance Screen   Has the patient fallen in the past 6 months No   Has the patient had a decrease in activity level because of a fear of falling?  No   Is the patient reluctant to leave their home because of a fear of falling?  No     Home [employer]ist information centre manager residence     Prior Function   Level of Independence Independent   Vocation Full time employment   Vocation Requirements sitting/labwork.   Leisure dancing, walking, staying active     Observation/Other Assessments   Skin Integrity incision is C/D/I, with one lightly indurated area.     ROM / Strength   AROM / PROM / Strength PROM;AROM;Strength     AROM   Overall AROM Comments knee extension full, knee flexion to 124 deg. in supine, 0 deg. hip extension  PROM   Overall PROM Comments knee extension 4 deg. hyperextension, flexion to 128 deg., (150 on L), hip ER/IR is grossly WNL     Strength   Overall Strength Comments R knee extension/flexion both 4/5, limited by pain most likely. Noted difficulty performing low level strength activities such as squat and sit<>stand, pt shifts significantly to L which she reports is due to previous hip surgery.     Palpation   Patella mobility decr. lateral patellar glide on R   Palpation comment tenderness to palpation of vastus lateralis, HS.     Ambulation/Gait   Assistive device None   Gait Pattern Within Functional Limits   Ambulation Surface Level   Gait velocity 1 m/s   Gait Comments minimal knee flexion, no reciprocal gait with stairs.            Objective: Standing hip abduction 3x10, required kegel and cuing for upright posture when bearing wt through R side due to trendelenberg  Standing hip extension 3x10  each side, same cuing.  Step ups on 4" step 3x10 performed B.  Following this pt reported mildly incr. Pain. Also reviewed SLR and squat, encouraged pt to continue with these at home. Issued all as HEP.                PT Education - 12/21/15 1537    Education provided Yes   Education Details expected course of PT   Person(s) Educated Patient   Methods Explanation   Comprehension Verbalized understanding             PT Long Term Goals - 12/21/15 1543      PT LONG TERM GOAL #1   Title Pt will be able to ascend stairs step over step   Baseline step to gait    Time 6   Period Weeks   Status New     PT LONG TERM GOAL #2   Title Pt will improve strength to 5/5 in RLE to decr. compensation from back and opposite LE   Baseline 4/5 and pain   Time 6   Period Weeks   Status New     PT LONG TERM GOAL #3   Title Pt will be able to return to dancing with pain no greater than 2/10   Baseline unable to dance   Time 6   Period Weeks   Status New               Plan - 12/21/15 1540    Clinical Impression Statement Pt is a pleasant 60 y/o female with recent partial TKA 12/01/2015. Since that time she has had several bouts of HHPT and is now being seen for outpatient PT. ROM is exceptional, full extension and flexion to 125 degrees. Pain is minimal and primarily with activity. Strength is limited and gait is impaired. Pt would benefit from skilled PT to address these issues and allow return to PLOF.   Rehab Potential Good   Clinical Impairments Affecting Rehab Potential motivaiton, response to previous surgery. Multiple complex orthopedic issues   PT Frequency 2x / week   PT Duration 6 weeks   PT Treatment/Interventions Aquatic Therapy;ADLs/Self Care Home Management;Therapeutic activities;Therapeutic exercise;Neuromuscular re-education;Patient/family education;Dry needling;Passive range of motion;Manual techniques   Consulted and Agree with Plan of Care Patient       Patient will benefit from skilled therapeutic intervention in order to improve the following deficits and impairments:  Pain, Improper body mechanics, Difficulty walking, Impaired flexibility, Decreased strength  Visit  Diagnosis: Pain in right knee - Plan: PT plan of care cert/re-cert  Muscle weakness (generalized) - Plan: PT plan of care cert/re-cert  Difficulty in walking, not elsewhere classified - Plan: PT plan of care cert/re-cert     Problem List Patient Active Problem List   Diagnosis Date Noted  . Vitamin D deficiency 11/11/2015  . Preventative health care 11/11/2015  . Osteopenia 11/11/2015  . Mass of left lower leg 09/07/2015  . Adverse drug reaction 06/13/2015  . Overweight (BMI 25.0-29.9) 06/13/2015  . Greater trochanteric bursitis of left hip 06/05/2015  . Bunion of left foot 06/05/2015  . Neuropathy of both feet (HCC) 06/05/2015  . 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 Thrombocytopenia (HCC) 04/05/2015  . Esophageal reflux 03/19/2015  . Need for shingles vaccine 03/14/2015  . Lower back pain 03/10/2015  . Dysphagia 03/10/2015  . Herpes simplex virus infection   . OA (osteoarthritis) of hip   . GERD (gastroesophageal reflux disease)   . Plantar fasciitis   . Depression   . Genital herpes   . Bilateral hearing loss     Imani Sherrin PT DPT 12/21/2015, 3:50 PM  [place] [facility] [address], [place], [address] Phone: [phone]   Fax:  [phone]  Name: Olivia Morgan MRN: [id number] Date of Birth: [birth year]

## 2015-12-24 ENCOUNTER — Ambulatory Visit: Payer: 59 | Admitting: Physical Therapy

## 2015-12-24 DIAGNOSIS — M6281 Muscle weakness (generalized): Secondary | ICD-10-CM

## 2015-12-24 DIAGNOSIS — M25561 Pain in right knee: Secondary | ICD-10-CM | POA: Diagnosis not present

## 2015-12-24 NOTE — Therapy (Signed)
[place] [facility] [address], [place], [address] Phone: [phone]   Fax:  [phone]  Physical Therapy Treatment  Patient Details  Name: Olivia Morgan MRN: [id number] Date of Birth: 1955-08-02 Referring Provider: hallows  Encounter Date: 12/24/2015      PT End of Session - 12/24/15 0946    Visit Number 2   Number of Visits 13   Date for PT Re-Evaluation 02/01/16   PT Start Time 0822   PT Stop Time 0900   PT Time Calculation (min) 38 min   Activity Tolerance Patient tolerated treatment well   Behavior During Therapy [facility] for tasks assessed/performed      Past Medical History:  Diagnosis Date  . Bilateral hearing loss    wears hearing aids  . Depression   . Genital herpes   . GERD (gastroesophageal reflux disease)   . Herpes simplex virus infection   . Murmur   . Neuropathy of both feet (HCC) 06/05/2015  . OA (osteoarthritis) of hip    left  . Plantar fasciitis   . Stomatitis     Past Surgical History:  Procedure Laterality Date  . CESAREAN SECTION    . COLONOSCOPY  2010  . FOOT SURGERY Right   . REPLACEMENT TOTAL KNEE  3/13   Dr.Hallows at [facility]  . TOTAL HIP ARTHROPLASTY Left 10/2008  . UPPER GI ENDOSCOPY  1992    There were no vitals filed for this visit.      Subjective Assessment - 12/24/15 0823    Subjective Pt reports incr. pain today. She has been walking more. She has been consistent with her HEP. She spoke with her MD who suggested icing for the pain.   How long can you sit comfortably? sitting for greater than 20 min incr. pain   Diagnostic tests imaging pre and post surgery   Patient Stated Goals return to normal level of activities. stairs, dancing.   Currently in Pain? Yes   Pain Score 2    Pain Location Knee   Pain Orientation Right                Objective: TG partial squats 3x20, cuing for pushing off of involved LE.  Step ups on 4" step  laterally, incr. Pain with this, then 4" step forward, extensive cuing and practice required to perform, then performed 3x5.  amb in clinic with cuing for incr. Knee flexion, decr. Circumduction, 5x100'  Squats performed with cuing for neutral posture/avoiding compensatory lean.  Heel raise also performed to assess for compensation, none noted.  Lateral glides x8 min total on RLE, reassessed gait followign this with table incr. In knee flexion.                      PT Long Term Goals - 12/21/15 1543      PT LONG TERM GOAL #1   Title Pt will be able to ascend stairs step over step   Baseline step to gait    Time 6   Period Weeks   Status New     PT LONG TERM GOAL #2   Title Pt will improve strength to 5/5 in RLE to decr. compensation from back and opposite LE   Baseline 4/5 and pain   Time 6   Period Weeks   Status New     PT LONG TERM GOAL #3   Title Pt will be able to return to  dancing with pain no greater than 2/10   Baseline unable to dance   Time 6   Period Weeks   Status New               Plan - 12/24/15 0947    Clinical Impression Statement Pt has incr. pain today likely due to incr. activity level as pt has been increasing her walking. ROM continues to improve, noted improvement in knee flexion with gait following manual intervention for improved lateral glide of patella. Look to progress in intensity at next session.   Rehab Potential Good   Clinical Impairments Affecting Rehab Potential motivaiton, response to previous surgery. Multiple complex orthopedic issues   PT Frequency 2x / week   PT Duration 6 weeks   PT Treatment/Interventions Aquatic Therapy;ADLs/Self Care Home Management;Therapeutic activities;Therapeutic exercise;Neuromuscular re-education;Patient/family education;Dry needling;Passive range of motion;Manual techniques   Consulted and Agree with Plan of Care Patient      Patient will benefit from skilled therapeutic  intervention in order to improve the following deficits and impairments:  Pain, Improper body mechanics, Difficulty walking, Impaired flexibility, Decreased strength  Visit Diagnosis: Muscle weakness (generalized)  Pain in right knee     Problem List Patient Active Problem List   Diagnosis Date Noted  . Vitamin D deficiency 11/11/2015  . Preventative health care 11/11/2015  . Osteopenia 11/11/2015  . Mass of left lower leg 09/07/2015  . Adverse drug reaction 06/13/2015  . Overweight (BMI 25.0-29.9) 06/13/2015  . Greater trochanteric bursitis of left hip 06/05/2015  . Bunion of left foot 06/05/2015  . Neuropathy of both feet (HCC) 06/05/2015  . 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 Thrombocytopenia (HCC) 04/05/2015  . Esophageal reflux 03/19/2015  . Need for shingles vaccine 03/14/2015  . Lower back pain 03/10/2015  . Dysphagia 03/10/2015  . Herpes simplex virus infection   . OA (osteoarthritis) of hip   . GERD (gastroesophageal reflux disease)   . Plantar fasciitis   . Depression   . Genital herpes   . Bilateral hearing loss     Kush Farabee PT DPT 12/24/2015, 10:17 AM  [place] [facility] [address] [place], [place], [address] Phone: [phone]   Fax:  [phone]  Name: Olivia Morgan MRN: [id number] Date of Birth: 11-21-1955

## 2015-12-27 ENCOUNTER — Ambulatory Visit: Payer: 59 | Admitting: Physical Therapy

## 2015-12-29 ENCOUNTER — Ambulatory Visit: Payer: 59 | Admitting: Physical Therapy

## 2015-12-29 DIAGNOSIS — M25561 Pain in right knee: Secondary | ICD-10-CM

## 2015-12-29 DIAGNOSIS — M6281 Muscle weakness (generalized): Secondary | ICD-10-CM

## 2015-12-30 NOTE — Therapy (Signed)
[place] [facility] [address] [place], [place], [address] Phone: [phone]   Fax:  [phone]  Physical Therapy Treatment  Patient Details  Name: Olivia Morgan MRN: [id number] Date of Birth: 09-25-1955 Referring Provider: hallows  Encounter Date: 12/29/2015      PT End of Session - 12/29/15 0659    Visit Number 3   Number of Visits 13   Date for PT Re-Evaluation 02/01/16   PT Start Time 0930   PT Stop Time 1010   PT Time Calculation (min) 40 min   Activity Tolerance Patient tolerated treatment well   Behavior During Therapy [facility] for tasks assessed/performed      Past Medical History:  Diagnosis Date  . Bilateral hearing loss    wears hearing aids  . Depression   . Genital herpes   . GERD (gastroesophageal reflux disease)   . Herpes simplex virus infection   . Murmur   . Neuropathy of both feet (HCC) 06/05/2015  . OA (osteoarthritis) of hip    left  . Plantar fasciitis   . Stomatitis     Past Surgical History:  Procedure Laterality Date  . CESAREAN SECTION    . COLONOSCOPY  2010  . FOOT SURGERY Right   . REPLACEMENT [facility]OTAL KNEE  [facility]13   [facility]  . TOTAL HIP ARTHROPLASTY Left 10/2008  . UPPER GI ENDOSCOPY  1992    There were no vitals filed for this visit.      Subjective Assessment - 12/29/15 0934    Subjective Pt reports incr. pain which she associates with incr. activity over the weekend. She walked several miles over the weekend.   How long can you sit comfortably? sitting for greater than 20 min incr. pain   Diagnostic tests imaging pre and post surgery   Patient Stated Goals return to normal level of activities. stairs, dancing.   Currently in Pain? Yes   Pain Score 2    Pain Location Knee   Pain Orientation Right                Objective: Gentle to more intense STM on R quad, HS, calf. Pt c/o moderate chronic calf pain. No heat, (-) for squeeze test  but PT strongly encouraged pt to call MD to discuss this.  Tib/femur, patellar glides, AP for tib femur, med/lat and distal glides for patella, 3x1 min of each.  Following this noted decr. Tenderness to end range stretching. Pt amb in clinic following this with decr. Antalgia/decr. Notable limp.  Supine extension and flexion stretching performed for knee following decr. In pain, total of 5 min stretching in each direction, ROM initially limited with muscle guarding but ROM returned to full extension and 125 deg. Flexion at end of session.                 PT Education - 12/29/15 0936    Education provided Yes   Education Details flare up related to overuse   Person(s) Educated Patient   Methods Explanation   Comprehension Verbalized understanding             PT Long Term Goals - 12/21/15 1543      PT LONG TERM GOAL #1   Title Pt will be able to ascend stairs step over step   Baseline step to gait    Time 6   Period Weeks   Status New     PT LONG TERM  GOAL #2   Title Pt will improve strength to 5/5 in RLE to decr. compensation from back and opposite LE   Baseline 4/5 and pain   Time 6   Period Weeks   Status New     PT LONG TERM GOAL #3   Title Pt will be able to return to dancing with pain no greater than 2/10   Baseline unable to dance   Time 6   Period Weeks   Status New               Plan - 12/29/15 4540    Clinical Impression Statement Pt has continued to incr. activity level above what PT recommends and is having notably incr. pain and decr. tolerance for gait, with incr. antalgia noted today. PT encouraged pt to rest and recover for the next few days, to be consistent with HEP but to avoid going to festivals or keeping foot in dependent position for extended periods of time until pain improves. Pt tolerated session of primarily manual intervention well.   Rehab Potential Good   Clinical Impairments Affecting Rehab Potential motivaiton,  response to previous surgery. Multiple complex orthopedic issues   PT Frequency 2x / week   PT Duration 6 weeks   PT Treatment/Interventions Aquatic Therapy;ADLs/Self Care Home Management;Therapeutic activities;Therapeutic exercise;Neuromuscular re-education;Patient/family education;Dry needling;Passive range of motion;Manual techniques   Consulted and Agree with Plan of Care Patient      Patient will benefit from skilled therapeutic intervention in order to improve the following deficits and impairments:  Pain, Improper body mechanics, Difficulty walking, Impaired flexibility, Decreased strength  Visit Diagnosis: Pain in right knee  Muscle weakness (generalized)     Problem List Patient Active Problem List   Diagnosis Date Noted  . Vitamin D deficiency 11/11/2015  . Preventative health care 11/11/2015  . Osteopenia 11/11/2015  . Mass of left lower leg 09/07/2015  . Adverse drug reaction 06/13/2015  . Overweight (BMI 25.0-29.9) 06/13/2015  . Greater trochanteric bursitis of left hip 06/05/2015  . Bunion of left foot 06/05/2015  . Neuropathy of both feet (HCC) 06/05/2015  . 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 Thrombocytopenia (HCC) 04/05/2015  . Esophageal reflux 03/19/2015  . Need for shingles vaccine 03/14/2015  . Lower back pain 03/10/2015  . Dysphagia 03/10/2015  . Herpes simplex virus infection   . OA (osteoarthritis) of hip   . GERD (gastroesophageal reflux disease)   . Plantar fasciitis   . Depression   . Genital herpes   . Bilateral hearing loss     Morgan,Olivia 12/30/2015, 7:05 AM  [place] [facility] SPORTS MEDICINE [address], [place], [address] Phone: [phone]   Fax:  [phone]  Name: Olivia Morgan MRN: [id number] Date of Birth: [birth year]

## 2015-12-31 ENCOUNTER — Ambulatory Visit: Payer: 59 | Admitting: Physical Therapy

## 2015-12-31 DIAGNOSIS — M6281 Muscle weakness (generalized): Secondary | ICD-10-CM

## 2015-12-31 DIAGNOSIS — M25561 Pain in right knee: Secondary | ICD-10-CM

## 2015-12-31 NOTE — Therapy (Signed)
[place] [facility] [address] [place], [place], [address] Phone: [phone]   Fax:  [phone]  Physical Therapy Treatment  Patient Details  Name: Joella Princeeresa A Buchholz MRN: [id number] Date of Birth: Aug 22, 1955 Referring Provider: hallows  Encounter Date: 12/31/2015      PT End of Session - 12/31/15 1124    Visit Number 4   Number of Visits 13   Date for PT Re-Evaluation 02/01/16   PT Start Time 1110   PT Stop Time 1150   PT Time Calculation (min) 40 min   Activity Tolerance Patient tolerated treatment well   Behavior During Therapy [facility] for tasks assessed/performed      Past Medical History:  Diagnosis Date  . Bilateral hearing loss    wears hearing aids  . Depression   . Genital herpes   . GERD (gastroesophageal reflux disease)   . Herpes simplex virus infection   . Murmur   . Neuropathy of both feet (HCC) 06/05/2015  . OA (osteoarthritis) of hip    left  . Plantar fasciitis   . Stomatitis     Past Surgical History:  Procedure Laterality Date  . CESAREAN SECTION    . COLONOSCOPY  2010  . FOOT SURGERY Right   . REPLACEMENT [facility]OTAL KNEE  3/13   [facility]  . TOTAL HIP ARTHROPLASTY Left 10/2008  . UPPER GI ENDOSCOPY  1992    There were no vitals filed for this visit.      Subjective Assessment - 12/31/15 1122    Subjective Pt reports she is doing much better today. Spoke with MD regarding pain in calf, otherwise has been resting.   How long can you sit comfortably? sitting for greater than 20 min incr. pain   Diagnostic tests imaging pre and post surgery   Patient Stated Goals return to normal level of activities. stairs, dancing.   Currently in Pain? No/denies   Pain Score 0-No pain               Objective:                  PT Education - 12/31/15 1123    Education provided Yes   Education Details Environmental education officergait practice   Person(s) Educated Patient   Methods  Explanation   Comprehension Verbalized understanding             PT Long Term Goals - 12/21/15 1543      PT LONG TERM GOAL #1   Title Pt will be able to ascend stairs step over step   Baseline step to gait    Time 6   Period Weeks   Status New     PT LONG TERM GOAL #2   Title Pt will improve strength to 5/5 in RLE to decr. compensation from back and opposite LE   Baseline 4/5 and pain   Time 6   Period Weeks   Status New     PT LONG TERM GOAL #3   Title Pt will be able to return to dancing with pain no greater than 2/10   Baseline unable to dance   Time 6   Period Weeks   Status New               Plan - 12/31/15 1128    Clinical Impression Statement Pt improved gait within session, note circumduction gait initially which improved with extensive cuing for knee flexion and heel to  toe gait. Also noted muscle weakness in gastroc B so addressed this. Addressed distal scar tightness.    Rehab Potential Good   Clinical Impairments Affecting Rehab Potential motivaiton, response to previous surgery. Multiple complex orthopedic issues   PT Frequency 2x / week   PT Duration 6 weeks   PT Treatment/Interventions Aquatic Therapy;ADLs/Self Care Home Management;Therapeutic activities;Therapeutic exercise;Neuromuscular re-education;Patient/family education;Dry needling;Passive range of motion;Manual techniques   Consulted and Agree with Plan of Care Patient      Patient will benefit from skilled therapeutic intervention in order to improve the following deficits and impairments:  Pain, Improper body mechanics, Difficulty walking, Impaired flexibility, Decreased strength  Visit Diagnosis: Muscle weakness (generalized)  Pain in right knee     Problem List Patient Active Problem List   Diagnosis Date Noted  . Vitamin D deficiency 11/11/2015  . Preventative health care 11/11/2015  . Osteopenia 11/11/2015  . Mass of left lower leg 09/07/2015  . Adverse drug reaction  06/13/2015  . Overweight (BMI 25.0-29.9) 06/13/2015  . Greater trochanteric bursitis of left hip 06/05/2015  . Bunion of left foot 06/05/2015  . Neuropathy of both feet (HCC) 06/05/2015  . 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 Thrombocytopenia (HCC) 04/05/2015  . Esophageal reflux 03/19/2015  . Need for shingles vaccine 03/14/2015  . Lower back pain 03/10/2015  . Dysphagia 03/10/2015  . Herpes simplex virus infection   . OA (osteoarthritis) of hip   . GERD (gastroesophageal reflux disease)   . Plantar fasciitis   . Depression   . Genital herpes   . Bilateral hearing loss     Fisher,Benjamin 12/31/2015, 11:55 AM  [place] [facility] PH[facility] SPORTS MEDICINE [address] [place], [address] Phone: [phone]   Fax:  [phone]  Name: TARRA PENCE MRN: [id number] Date of Birth: [birth year]

## 2016-01-02 ENCOUNTER — Other Ambulatory Visit: Payer: Self-pay | Admitting: Family Medicine

## 2016-01-04 ENCOUNTER — Ambulatory Visit: Payer: 59 | Admitting: Physical Therapy

## 2016-01-04 DIAGNOSIS — M6281 Muscle weakness (generalized): Secondary | ICD-10-CM

## 2016-01-04 DIAGNOSIS — M25561 Pain in right knee: Secondary | ICD-10-CM | POA: Diagnosis not present

## 2016-01-04 NOTE — Therapy (Signed)
[facility] PH[facility] MEDICINE [address] [place], [place], [address] Phone: [phone]   Fax:  [phone]  Physical Therapy Treatment  Patient Details  Name: Olivia Princeeresa A Judice MRN: [id number] Date of Birth: July 29, 1955 Referring Provider: hallows  Encounter Date: 01/04/2016      PT End of Session - 01/04/16 0930    Visit Number 5   Number of Visits 13   Date for PT Re-Evaluation 02/01/16   PT Start Time 0910   PT Stop Time 0950   PT Time Calculation (min) 40 min   Activity Tolerance Patient tolerated treatment well   Behavior During Therapy [facility] for tasks assessed/performed      Past Medical History:  Diagnosis Date  . Bilateral hearing loss    wears hearing aids  . Depression   . Genital herpes   . GERD (gastroesophageal reflux disease)   . Herpes simplex virus infection   . Murmur   . Neuropathy of both feet (HCC) 06/05/2015  . OA (osteoarthritis) of hip    left  . Plantar fasciitis   . Stomatitis     Past Surgical History:  Procedure Laterality Date  . CESAREAN SECTION    . COLONOSCOPY  2010  . FOOT SURGERY Right   . REPLACEMENT [facility] KN[facility]13   [facility]  . TOTAL HIP ARTHROPLASTY Left 10/2008  . UPPER GI ENDOSCOPY  1992    There were no vitals filed for this visit.      Subjective Assessment - 01/04/16 0921    Subjective Pt reports increased pain today.   How long can you sit comfortably? sitting for greater than 20 min incr. pain   Diagnostic tests imaging pre and post surgery   Patient Stated Goals return to normal level of activities. stairs, dancing.   Currently in Pain? Yes   Pain Score 3    Pain Location Knee   Pain Orientation Right                 Objective: TG squats with cuing for deep squat, 3x20.  TG heel raises 3x20, both with setting to 25 for incr. Resistance.  4" step ups 3x1 min on R side.  4" step downs, extensive practice in order to avoid  femoral lack of control. 10 min practice.  Seated orange ball flexion/extension x4 min.  Pt had improvement in gait following this.  Amb in clinic throughout session. Initially antalgic but following 4" step down improvement decr. Pain.                      PT Long Term Goals - 12/21/15 1543      PT LONG TERM GOAL #1   Title Pt will be able to ascend stairs step over step   Baseline step to gait    Time 6   Period Weeks   Status New     PT LONG TERM GOAL #2   Title Pt will improve strength to 5/5 in RLE to decr. compensation from back and opposite LE   Baseline 4/5 and pain   Time 6   Period Weeks   Status New     PT LONG TERM GOAL #3   Title Pt will be able to return to dancing with pain no greater than 2/10   Baseline unable to dance   Time 6   Period Weeks   Status New  Plan - 01/04/16 0932    Clinical Impression Statement Pt is making progress with gait, strengthening. 1200' . unable to perform step up or step down well due to significant muscle weakness. continues to c/o significant pain.   Rehab Potential Good   Clinical Impairments Affecting Rehab Potential motivaiton, response to previous surgery. Multiple complex orthopedic issues   PT Frequency 2x / week   PT Duration 6 weeks   PT Treatment/Interventions Aquatic Therapy;ADLs/Self Care Home Management;Therapeutic activities;Therapeutic exercise;Neuromuscular re-education;Patient/family education;Dry needling;Passive range of motion;Manual techniques   Consulted and Agree with Plan of Care Patient      Patient will benefit from skilled therapeutic intervention in order to improve the following deficits and impairments:  Pain, Improper body mechanics, Difficulty walking, Impaired flexibility, Decreased strength  Visit Diagnosis: Muscle weakness (generalized)  Pain in right knee     Problem List Patient Active Problem List   Diagnosis Date Noted  . Vitamin D  deficiency 11/11/2015  . Preventative health care 11/11/2015  . Osteopenia 11/11/2015  . Mass of left lower leg 09/07/2015  . Adverse drug reaction 06/13/2015  . Overweight (BMI 25.0-29.9) 06/13/2015  . Greater trochanteric bursitis of left hip 06/05/2015  . Bunion of left foot 06/05/2015  . Neuropathy of both feet (HCC) 06/05/2015  . 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 Thrombocytopenia (HCC) 04/05/2015  . Esophageal reflux 03/19/2015  . Need for shingles vaccine 03/14/2015  . Lower back pain 03/10/2015  . Dysphagia 03/10/2015  . Herpes simplex virus infection   . OA (osteoarthritis) of hip   . GERD (gastroesophageal reflux disease)   . Plantar fasciitis   . Depression   . Genital herpes   . Bilateral hearing loss     Olivia Morgan 01/04/2016, 10:01 AM  [facility] PHYSICAL AND SPORTS MEDICINE [address] [place], [address] Phone: [phone]   Fax:  [phone]  Name: Olivia Morgan MRN: [id number] Date of Birth: Nov 30, 1955

## 2016-01-06 ENCOUNTER — Ambulatory Visit: Payer: 59 | Admitting: Physical Therapy

## 2016-01-07 ENCOUNTER — Encounter: Payer: 59 | Admitting: Physical Therapy

## 2016-01-10 ENCOUNTER — Ambulatory Visit: Payer: 59 | Attending: Orthopaedic Surgery | Admitting: Physical Therapy

## 2016-01-10 ENCOUNTER — Other Ambulatory Visit: Payer: Self-pay | Admitting: Family Medicine

## 2016-01-10 ENCOUNTER — Encounter: Payer: Self-pay | Admitting: Family Medicine

## 2016-01-10 DIAGNOSIS — G8929 Other chronic pain: Secondary | ICD-10-CM

## 2016-01-10 DIAGNOSIS — M25561 Pain in right knee: Secondary | ICD-10-CM | POA: Insufficient documentation

## 2016-01-10 DIAGNOSIS — R262 Difficulty in walking, not elsewhere classified: Secondary | ICD-10-CM | POA: Diagnosis present

## 2016-01-10 DIAGNOSIS — M6281 Muscle weakness (generalized): Secondary | ICD-10-CM | POA: Diagnosis present

## 2016-01-10 NOTE — Telephone Encounter (Signed)
Please call patient We really need to consider weaning her down on this Aciphex Prolonged use can cause serious problems I'd like to see if she's willing to take this just once a day; if not, I'm inclined to refer her back to GI for a repeat EGD if she's having such persistent symptoms

## 2016-01-11 NOTE — Telephone Encounter (Signed)
Patient states does not even need refill on this med?

## 2016-01-11 NOTE — Therapy (Signed)
[facility]ICINE [address] [place], [address] Phone: [phone]   Fax:  [phone]  Physical Therapy Treatment  Patient Details  Name: Olivia Morgan MRN: [id number] Date of Birth: May 27, 1955 Referring Provider: hallows  Encounter Date: 01/10/2016      PT End of Session - 01/10/16 0955    Visit Number 6   Number of Visits 13   Date for PT Re-Evaluation 02/01/16   PT Start Time 0945   PT Stop Time 1030   PT Time Calculation (min) 45 min   Activity Tolerance Patient tolerated treatment well   Behavior During Therapy [facility] for tasks assessed/performed      Past Medical History:  Diagnosis Date  . Bilateral hearing loss    wears hearing aids  . Depression   . Genital herpes   . GERD (gastroesophageal reflux disease)   . Herpes simplex virus infection   . Murmur   . Neuropathy of both feet 06/05/2015  . OA (osteoarthritis) of hip    left  . Plantar fasciitis   . Stomatitis     Past Surgical History:  Procedure Laterality Date  . CESAREAN SECTION    . COLONOSCOPY  2010  . FOOT SURGERY Right   . REPLACEMENT TOTAL KNEE  3/13   Dr.Hallows at [facility]  . TOTAL HIP ARTHROPLASTY Left 10/2008  . UPPER GI ENDOSCOPY  1992    There were no vitals filed for this visit.      Subjective Assessment - 01/10/16 0953    Subjective Pt reports incr. activity including stairs and incr. walking. mildly incr. pain with this but "not too bad".   How long can you sit comfortably? sitting for greater than 20 min incr. pain   Diagnostic tests imaging pre and post surgery   Patient Stated Goals return to normal level of activities. stairs, dancing.   Currently in Pain? No/denies               Objective: Knee ROM assessment - full extension, greater than 130 deg. Knee flexion with no c/o pain.  amb in clinic, initially with mild antalgia noted, trendelenberg likely due to hip weakness on post  THA side.  Standing hip extension, hip abduction 3x10 tapping toe down to decr. Back extension. Performed B.  Heel raise with cuing for improved control with performance of this. 3x15 on step with cuing to achieve calf stretch prior to lift off.  amb up and down ramp 50', x10 focusing on push off from involved LE.  amb on uneven surfaces (grass, hills) to assess for appropriateness of return to typical level of function.  Pt became fatigued following session, will look to address this at next session with incr. Endurance reltaed activties as pt is returning to work this week.                  PT Education - 01/10/16 0954    Education provided Yes   Education Details progression of HEP   Person(s) Educated Patient   Methods Explanation   Comprehension Verbalized understanding             PT Long Term Goals - 12/21/15 1543      PT LONG TERM GOAL #1   Title Pt will be able to ascend stairs step over step   Baseline step to gait    Time 6   Period Weeks   Status New     PT  LONG TERM GOAL #2   Title Pt will improve strength to 5/5 in RLE to decr. compensation from back and opposite LE   Baseline 4/5 and pain   Time 6   Period Weeks   Status New     PT LONG TERM GOAL #3   Title Pt will be able to return to dancing with pain no greater than 2/10   Baseline unable to dance   Time 6   Period Weeks   Status New               Plan - 01/10/16 0715    Clinical Impression Statement Pt continues to make significant improvements, is on target for being discharged in the next two weeks. ROM is full, gait is variable but pt is able to amb on uneven surfaces and up and down curbs with minimal c/o pain. Pt at this time needs to focus primarily on strengthening.   Rehab Potential Good   Clinical Impairments Affecting Rehab Potential motivaiton, response to previous surgery. Multiple complex orthopedic issues   PT Frequency 2x / week   PT Duration 6 weeks   PT  Treatment/Interventions Aquatic Therapy;ADLs/Self Care Home Management;Therapeutic activities;Therapeutic exercise;Neuromuscular re-education;Patient/family education;Dry needling;Passive range of motion;Manual techniques   Consulted and Agree with Plan of Care Patient      Patient will benefit from skilled therapeutic intervention in order to improve the following deficits and impairments:  Pain, Improper body mechanics, Difficulty walking, Impaired flexibility, Decreased strength  Visit Diagnosis: Muscle weakness (generalized)  Chronic pain of right knee     Problem List Patient Active Problem List   Diagnosis Date Noted  . Vitamin D deficiency 11/11/2015  . Preventative health care 11/11/2015  . Osteopenia 11/11/2015  . Mass of left lower leg 09/07/2015  . Adverse drug reaction 06/13/2015  . Overweight (BMI 25.0-29.9) 06/13/2015  . Greater trochanteric bursitis of left hip 06/05/2015  . Bunion of left foot 06/05/2015  . Neuropathy of both feet 06/05/2015  . 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 Thrombocytopenia (HCC) 04/05/2015  . Esophageal reflux 03/19/2015  . Need for shingles vaccine 03/14/2015  . Lower back pain 03/10/2015  . Dysphagia 03/10/2015  . Herpes simplex virus infection   . OA (osteoarthritis) of hip   . GERD (gastroesophageal reflux disease)   . Plantar fasciitis   . Depression   . Genital herpes   . Bilateral hearing loss     Fisher,Benjamin 01/11/2016, 7:17 AM  [place] [facility] SPORTS MEDICINE [address] [place], [place], [address] Phone: [phone]   Fax:  [phone]  Name: Olivia Morgan MRN: [id number] Date of Birth: 1955/07/18

## 2016-01-13 ENCOUNTER — Ambulatory Visit: Payer: 59 | Admitting: Physical Therapy

## 2016-01-17 ENCOUNTER — Ambulatory Visit: Payer: 59 | Admitting: Physical Therapy

## 2016-01-17 DIAGNOSIS — G8929 Other chronic pain: Secondary | ICD-10-CM

## 2016-01-17 DIAGNOSIS — M6281 Muscle weakness (generalized): Secondary | ICD-10-CM

## 2016-01-17 DIAGNOSIS — M25561 Pain in right knee: Principal | ICD-10-CM

## 2016-01-18 NOTE — Therapy (Signed)
[facility] PHYSICAL [facility] SPORTS MEDICINE [address] [place], [place], [address] Phone: [phone]   Fax:  [phone]  Physical Therapy Treatment  Patient Details  Name: Olivia Morgan MRN: [id number] Date of Birth: [birth year] Referring Provider: hallows  Encounter Date: 01/17/2016      PT End of Session - 01/17/16 0922    Visit Number 7   Number of Visits 13   Date for PT Re-Evaluation 02/01/16   PT Start Time 1115   PT Stop Time 1155   PT Time Calculation (min) 40 min   Activity Tolerance Patient tolerated treatment well   Behavior During Therapy [facility] for tasks assessed/performed      Past Medical History:  Diagnosis Date  . Bilateral hearing loss    wears hearing aids  . Depression   . Genital herpes   . GERD (gastroesophageal reflux disease)   . Herpes simplex virus infection   . Murmur   . Neuropathy of both feet 06/05/2015  . OA (osteoarthritis) of hip    left  . Plantar fasciitis   . Stomatitis     Past Surgical History:  Procedure Laterality Date  . CESAREAN SECTION    . COLONOSCOPY  2010  . FOOT SURGERY Right   . REPLACEMENT TOTAL KNEE  3/13   [facility]  . TOTAL HIP ARTHROPLASTY Left 10/2008  . UPPER GI ENDOSCOPY  1992    There were no vitals filed for this visit.      Subjective Assessment - 01/17/16 1118    Subjective Pt had a near fall over the weekend. Has seen MD who is concerned baout her "goose egg" swelling area around knee.   How long can you sit comfortably? sitting for greater than 20 min incr. pain   Diagnostic tests imaging pre and post surgery   Patient Stated Goals return to normal level of activities. stairs, dancing.   Currently in Pain? No/denies               Objective: Patellar mobs in all 4 directions 3x1 min to tolerable yet variable grade.  Scar massage and insruction in scar massage performed x8 min total including instruction time.  Adductor  release performed due to pain at end range extension.  Passive stretching focusing on end range extension following manual intervention to improve/decr. Pain in knee with end range extension.  Following sessio noted decr. Edema in knee and decr. Pain at end range for extension from 4/10 to 1/10.  Gait with improved TKE following session.                  PT Education - 01/17/16 1120    Education provided Yes   Education Details scar massage   Person(s) Educated Patient   Methods Explanation   Comprehension Verbalized understanding             PT Long Term Goals - 12/21/15 1543      PT LONG TERM GOAL #1   Title Pt will be able to ascend stairs step over step   Baseline step to gait    Time 6   Period Weeks   Status New     PT LONG TERM GOAL #2   Title Pt will improve strength to 5/5 in RLE to decr. compensation from back and opposite LE   Baseline 4/5 and pain   Time 6   Period Weeks   Status New     PT LONG  TERM GOAL #3   Title Pt will be able to return to dancing with pain no greater than 2/10   Baseline unable to dance   Time 6   Period Weeks   Status New               Plan - 01/17/16 1610    Clinical Impression Statement Focus of session on decr. swelling and irritation in scar which pt has said has been worse since her fall last week. Pt has seen MD who is pleased with her progress. Currently presents with mild pain and pocket of swelling near knee.   Rehab Potential Good   Clinical Impairments Affecting Rehab Potential motivaiton, response to previous surgery. Multiple complex orthopedic issues   PT Frequency 2x / week   PT Duration 6 weeks   PT Treatment/Interventions Aquatic Therapy;ADLs/Self Care Home Management;Therapeutic activities;Therapeutic exercise;Neuromuscular re-education;Patient/family education;Dry needling;Passive range of motion;Manual techniques   Consulted and Agree with Plan of Care Patient      Patient will  benefit from skilled therapeutic intervention in order to improve the following deficits and impairments:  Pain, Improper body mechanics, Difficulty walking, Impaired flexibility, Decreased strength  Visit Diagnosis: Chronic pain of right knee  Muscle weakness (generalized)     Problem List Patient Active Problem List   Diagnosis Date Noted  . Vitamin D deficiency 11/11/2015  . Preventative health care 11/11/2015  . Osteopenia 11/11/2015  . Mass of left lower leg 09/07/2015  . Adverse drug reaction 06/13/2015  . Overweight (BMI 25.0-29.9) 06/13/2015  . Greater trochanteric bursitis of left hip 06/05/2015  . Bunion of left foot 06/05/2015  . Neuropathy of both feet 06/05/2015  . 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 Thrombocytopenia (HCC) 04/05/2015  . Esophageal reflux 03/19/2015  . Need for shingles vaccine 03/14/2015  . Lower back pain 03/10/2015  . Dysphagia 03/10/2015  . Herpes simplex virus infection   . OA (osteoarthritis) of hip   . GERD (gastroesophageal reflux disease)   . Plantar fasciitis   . Depression   . Genital herpes   . Bilateral hearing loss     Ahmir Bracken 01/18/2016, 9:27 AM  [facility] PHYSICAL AND SPORTS MEDICINE [address] [place], [address] Phone: [phone]   Fax:  [phone]  Name: Olivia Morgan MRN: [id number] Date of Birth: [birth year]

## 2016-01-20 ENCOUNTER — Ambulatory Visit: Payer: 59 | Admitting: Physical Therapy

## 2016-01-20 DIAGNOSIS — M25561 Pain in right knee: Secondary | ICD-10-CM

## 2016-01-20 DIAGNOSIS — M6281 Muscle weakness (generalized): Secondary | ICD-10-CM | POA: Diagnosis not present

## 2016-01-20 DIAGNOSIS — G8929 Other chronic pain: Secondary | ICD-10-CM

## 2016-01-20 NOTE — Therapy (Signed)
[facility] PHYSICAL [facility] SPORTS MEDICINE [address] [place], [address] Phone: [phone]   Fax:  [phone]  Physical Therapy Treatment  Patient Details  Name: Olivia Morgan MRN: [id number] Date of Birth: 1955/11/01 Referring Provider: hallows  Encounter Date: 01/20/2016      PT End of Session - 01/20/16 1143    Visit Number 8   Number of Visits 13   Date for PT Re-Evaluation 02/01/16   PT Start Time 1015   PT Stop Time 1100   PT Time Calculation (min) 45 min   Activity Tolerance Patient tolerated treatment well   Behavior During Therapy [facility] for tasks assessed/performed      Past Medical History:  Diagnosis Date  . Bilateral hearing loss    wears hearing aids  . Depression   . Genital herpes   . GERD (gastroesophageal reflux disease)   . Herpes simplex virus infection   . Murmur   . Neuropathy of both feet 06/05/2015  . OA (osteoarthritis) of hip    left  . Plantar fasciitis   . Stomatitis     Past Surgical History:  Procedure Laterality Date  . CESAREAN SECTION    . COLONOSCOPY  2010  . FOOT SURGERY Right   . REPLACEMENT TOTAL KNEE  3/13   Dr[facility]Hallows at [facility]  . TOTAL HIP ARTHROPLASTY Left 10/2008  . UPPER GI ENDOSCOPY  1992    There were no vitals filed for this visit.      Subjective Assessment - 01/20/16 1101    Subjective Pt reports she had difficulty with pain over the last few days and has been having difficulty with sleeping due to this.   How long can you sit comfortably? sitting for greater than 20 min incr. pain   Diagnostic tests imaging pre and post surgery   Patient Stated Goals return to normal level of activities. stairs, dancing.   Currently in Pain? No/denies   Pain Score 0-No pain               Objective:                   PT Education - 01/20/16 1142    Education provided Yes   Education Details HEP   Person(s) Educated Patient   Methods  Explanation   Comprehension Verbalized understanding             PT Long Term Goals - 12/21/15 1543      PT LONG TERM GOAL #1   Title Pt will be able to ascend stairs step over step   Baseline step to gait    Time 6   Period Weeks   Status New     PT LONG TERM GOAL #2   Title Pt will improve strength to 5/5 in RLE to decr. compensation from back and opposite LE   Baseline 4/5 and pain   Time 6   Period Weeks   Status New     PT LONG TERM GOAL #3   Title Pt will be able to return to dancing with pain no greater than 2/10   Baseline unable to dance   Time 6   Period Weeks   Status New               Plan - 01/20/16 1144    Clinical Impression Statement Pt responded well to session focusing on improving stairs. Pt has difficulty with step over strategy, however no  pain, primarily muscle weakness and poor motor control.   Rehab Potential Good   Clinical Impairments Affecting Rehab Potential motivaiton, response to previous surgery. Multiple complex orthopedic issues   PT Frequency 2x / week   PT Duration 6 weeks   PT Treatment/Interventions Aquatic Therapy;ADLs/Self Care Home Management;Therapeutic activities;Therapeutic exercise;Neuromuscular re-education;Patient/family education;Dry needling;Passive range of motion;Manual techniques   Consulted and Agree with Plan of Care Patient      Patient will benefit from skilled therapeutic intervention in order to improve the following deficits and impairments:  Pain, Improper body mechanics, Difficulty walking, Impaired flexibility, Decreased strength  Visit Diagnosis: Muscle weakness (generalized)  Chronic pain of right knee     Problem List Patient Active Problem List   Diagnosis Date Noted  . Vitamin D deficiency 11/11/2015  . Preventative health care 11/11/2015  . Osteopenia 11/11/2015  . Mass of left lower leg 09/07/2015  . Adverse drug reaction 06/13/2015  . Overweight (BMI 25.0-29.9) 06/13/2015  .  Greater trochanteric bursitis of left hip 06/05/2015  . Bunion of left foot 06/05/2015  . Neuropathy of both feet 06/05/2015  . 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 Thrombocytopenia (HCC) 04/05/2015  . Esophageal reflux 03/19/2015  . Need for shingles vaccine 03/14/2015  . Lower back pain 03/10/2015  . Dysphagia 03/10/2015  . Herpes simplex virus infection   . OA (osteoarthritis) of hip   . GERD (gastroesophageal reflux disease)   . Plantar fasciitis   . Depression   . Genital herpes   . Bilateral hearing loss     Fisher,Benjamin PT DPT 01/20/2016, 11:49 AM  [facility] PH[facility] MEDICINE [address] , [place], [address] Phone: [phone]   Fax:  [phone]  Name: Olivia Morgan MRN: [id number] Date of Birth: January 01, 1956

## 2016-01-24 ENCOUNTER — Ambulatory Visit: Payer: 59 | Admitting: Physical Therapy

## 2016-01-24 DIAGNOSIS — R262 Difficulty in walking, not elsewhere classified: Secondary | ICD-10-CM

## 2016-01-24 DIAGNOSIS — M6281 Muscle weakness (generalized): Secondary | ICD-10-CM | POA: Diagnosis not present

## 2016-01-25 NOTE — Therapy (Signed)
[facility] [address] [place], [address] Phone: [phone]   Fax:  [phone]  Physical Therapy Treatment  Patient Details  Name: ROSA WYLY MRN: [id number] Date of Birth: 1956-03-09 Referring Provider: hallows  Encounter Date: 01/24/2016      PT End of Session - 01/24/16 1152    Visit Number 9   Number of Visits 13   Date for PT Re-Evaluation 02/01/16   PT Start Time 1145   PT Stop Time 1225   PT Time Calculation (min) 40 min   Activity Tolerance Patient tolerated treatment well   Behavior During Therapy [facility] for tasks assessed/performed      Past Medical History:  Diagnosis Date  . Bilateral hearing loss    wears hearing aids  . Depression   . Genital herpes   . GERD (gastroesophageal reflux disease)   . Herpes simplex virus infection   . Murmur   . Neuropathy of both feet 06/05/2015  . OA (osteoarthritis) of hip    left  . Plantar fasciitis   . Stomatitis     Past Surgical History:  Procedure Laterality Date  . CESAREAN SECTION    . COLONOSCOPY  2010  . FOOT SURGERY Right   . 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 TOTAL HIP ARTHROPLASTY Left 10/2008  . UPPER GI ENDOSCOPY  1992    There were no vitals filed for this visit.      Subjective Assessment - 01/24/16 1149    Subjective pt had severe pain following previous session, pain was in quadriceps tendon and improved pain over time to baseline. Maintained activity level despite pain.    How long can you sit comfortably? sitting for greater than 20 min incr. pain   Diagnostic tests imaging pre and post surgery   Patient Stated Goals return to normal level of activities. stairs, dancing.   Currently in Pain? Yes   Pain Score 1    Pain Location Knee   Pain Orientation Right                  Objective: Extensive amb in clinic, initially highly antalgic. With cuing to flex knee,  avoid circumduction.   5x10' bouts of gait with the following cues: High knee marching Knee flexion marching Toe walking Fast walking with cuing for minimizing lateral "wobble"  STM performed on adductor, scar massage both performed, x10 min total, decr. Pain with gait following this.  Partial squats 5x5 with cuing to deload anterior knee (improve posterior glute activation) requiring multiple verbal and manual cues - pt able to perform with decr. Pain following this.                      PT Long Term Goals - 12/21/15 1543      PT LONG TERM GOAL #1   Title Pt will be able to ascend stairs step over step   Baseline step to gait    Time 6   Period Weeks   Status New     PT LONG TERM GOAL #2   Title Pt will improve strength to 5/5 in RLE to decr. compensation from back and opposite LE   Baseline 4/5 and pain   Time 6   Period Weeks   Status New     PT LONG TERM GOAL #3   Title Pt will be able to return to dancing with  pain no greater than 2/10   Baseline unable to dance   Time 6   Period Weeks   Status New               Plan - 01/24/16 0741    Clinical Impression Statement Pt requested to end session early to get back to work. She is continuing to work on strengthening and improving gait, presented with incr. antalgic gait today but able to correct this with continued cuing. Pt is inconsistent with this and would benefit from further work on this, as well as pain in knee.    Rehab Potential Good   Clinical Impairments Affecting Rehab Potential motivaiton, response to previous surgery. Multiple complex orthopedic issues   PT Frequency 2x / week   PT Duration 6 weeks   PT Treatment/Interventions Aquatic Therapy;ADLs/Self Care Home Management;Therapeutic activities;Therapeutic exercise;Neuromuscular re-education;Patient/family education;Dry needling;Passive range of motion;Manual techniques   Consulted and Agree with Plan of Care Patient      Patient  will benefit from skilled therapeutic intervention in order to improve the following deficits and impairments:  Pain, Improper body mechanics, Difficulty walking, Impaired flexibility, Decreased strength  Visit Diagnosis: Muscle weakness (generalized)  Difficulty in walking, not elsewhere classified     Problem List Patient Active Problem List   Diagnosis Date Noted  . Vitamin D deficiency 11/11/2015  . Preventative health care 11/11/2015  . Osteopenia 11/11/2015  . Mass of left lower leg 09/07/2015  . Adverse drug reaction 06/13/2015  . Overweight (BMI 25.0-29.9) 06/13/2015  . Greater trochanteric bursitis of left hip 06/05/2015  . Bunion of left foot 06/05/2015  . Neuropathy of both feet 06/05/2015  . 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 Thrombocytopenia (HCC) 04/05/2015  . Esophageal reflux 03/19/2015  . Need for shingles vaccine 03/14/2015  . Lower back pain 03/10/2015  . Dysphagia 03/10/2015  . Herpes simplex virus infection   . OA (osteoarthritis) of hip   . GERD (gastroesophageal reflux disease)   . Plantar fasciitis   . Depression   . Genital herpes   . Bilateral hearing loss     Zakaria Fromer 01/25/2016, 7:46 AM  [facility] [address] [place], [place], [address] Phone: [phone]   Fax:  [phone]  Name: Joella Princeeresa A Bonus MRN: [id number] Date of Birth: 07/13/1955

## 2016-01-27 ENCOUNTER — Ambulatory Visit: Payer: 59 | Admitting: Physical Therapy

## 2016-01-27 DIAGNOSIS — R262 Difficulty in walking, not elsewhere classified: Secondary | ICD-10-CM

## 2016-01-27 DIAGNOSIS — M25561 Pain in right knee: Secondary | ICD-10-CM

## 2016-01-27 DIAGNOSIS — G8929 Other chronic pain: Secondary | ICD-10-CM

## 2016-01-27 DIAGNOSIS — M6281 Muscle weakness (generalized): Secondary | ICD-10-CM | POA: Diagnosis not present

## 2016-01-28 NOTE — Therapy (Signed)
[facility] [address] [place], [place], [address] Phone: [phone]   Fax:  [phone]  Physical Therapy Treatment  Patient Details  Name: Olivia Morgan MRN: [id number] Date of Birth: 09-16-1955 Referring Provider: hallows  Encounter Date: 01/27/2016      PT End of Session - 01/28/16 0649    Visit Number 10   Number of Visits 13   Date for PT Re-Evaluation 02/01/16   PT Start Time 1115   PT Stop Time 1155   PT Time Calculation (min) 40 min   Activity Tolerance Patient tolerated treatment well   Behavior During Therapy [facility] for tasks assessed/performed      Past Medical History:  Diagnosis Date  . Bilateral hearing loss    wears hearing aids  . Depression   . Genital herpes   . GERD (gastroesophageal reflux disease)   . Herpes simplex virus infection   . Murmur   . Neuropathy of both feet 06/05/2015  . OA (osteoarthritis) of hip    left  . Plantar fasciitis   . Stomatitis     Past Surgical History:  Procedure Laterality Date  . CESAREAN SECTION    . COLONOSCOPY  2010  . FOOT SURGERY Right   . 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 TOTAL HIP ARTHROPLASTY Left 10/2008  . UPPER GI ENDOSCOPY  1992    There were no vitals filed for this visit.      Subjective Assessment - 01/27/16 1122    Subjective Pt attended a concert over the past two days, extensive walking. is stlil reporting significant pain in posterior knee and "feels that something is wrong" with her knee.   How long can you sit comfortably? sitting for greater than 20 min incr. pain   Diagnostic tests imaging pre and post surgery   Patient Stated Goals return to normal level of activities. stairs, dancing.   Currently in Pain? Yes   Pain Score 3    Pain Location Knee   Pain Orientation Posterior;Right               Objective: STM including IASTM with graston tool 4 using Jstroke, sweep and  fan techniques on posterior LE, including B HS, gastroc. Avoided area of swelling as this was acutely painful for pt. Paired this with passive stretching into full extension and very light manual resistance which consistently induced pain.  Educated pt on monitoring symptoms of incr. Pain in posterior knee, swelling. Used pt phone to take two photos and pt will email these to PA. Encouraged pt to monitor temperature, elevate knee and use ice to address continued swelling, decr. HEP at home until pain decreases.  Pt verbalized understanding of self management education.                   PT Education - 01/27/16 [phone]    Education provided Yes   Education Details monitoring symptoms, calling MD due to incr. swelling in posterior knee   Person(s) Educated Patient   Methods Explanation   Comprehension Verbalized understanding             PT Long Term Goals - 12/21/15 1543      PT LONG TERM GOAL #1   Title Pt will be able to ascend stairs step over step   Baseline step to gait    Time 6   Period Weeks   Status New  PT LONG TERM GOAL #2   Title Pt will improve strength to 5/5 in RLE to decr. compensation from back and opposite LE   Baseline 4/5 and pain   Time 6   Period Weeks   Status New     PT LONG TERM GOAL #3   Title Pt will be able to return to dancing with pain no greater than 2/10   Baseline unable to dance   Time 6   Period Weeks   Status New               Plan - 01/27/16 0650    Clinical Impression Statement Decr. tolerance for exercise today due to incr. pain in HS region. Gait with incr. antalgia noted especially at terminal knee extension, no change with extensive manual intervention. Pt has incr. area of swelling in posterior knee with mild redness. May be normal but encouraged pt to call MD to inform him of current situation.   Rehab Potential Good   Clinical Impairments Affecting Rehab Potential motivaiton, response to previous surgery.  Multiple complex orthopedic issues   PT Frequency 2x / week   PT Duration 6 weeks   PT Treatment/Interventions Aquatic Therapy;ADLs/Self Care Home Management;Therapeutic activities;Therapeutic exercise;Neuromuscular re-education;Patient/family education;Dry needling;Passive range of motion;Manual techniques   Consulted and Agree with Plan of Care Patient      Patient will benefit from skilled therapeutic intervention in order to improve the following deficits and impairments:  Pain, Improper body mechanics, Difficulty walking, Impaired flexibility, Decreased strength  Visit Diagnosis: Difficulty in walking, not elsewhere classified  Chronic pain of right knee     Problem List Patient Active Problem List   Diagnosis Date Noted  . Vitamin D deficiency 11/11/2015  . Preventative health care 11/11/2015  . Osteopenia 11/11/2015  . Mass of left lower leg 09/07/2015  . Adverse drug reaction 06/13/2015  . Overweight (BMI 25.0-29.9) 06/13/2015  . Greater trochanteric bursitis of left hip 06/05/2015  . Bunion of left foot 06/05/2015  . Neuropathy of both feet 06/05/2015  . 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 Thrombocytopenia (HCC) 04/05/2015  . Esophageal reflux 03/19/2015  . Need for shingles vaccine 03/14/2015  . Lower back pain 03/10/2015  . Dysphagia 03/10/2015  . Herpes simplex virus infection   . OA (osteoarthritis) of hip   . GERD (gastroesophageal reflux disease)   . Plantar fasciitis   . Depression   . Genital herpes   . Bilateral hearing loss     Fisher,Benjamin PT DPT 01/28/2016, 6:53 AM  [facility]TS [facility] [address] [place], [address] Phone: [phone]   Fax:  [phone]  Name: Olivia Morgan MRN: [id number] Date of Birth: 01/21/1956

## 2016-02-01 ENCOUNTER — Ambulatory Visit: Payer: 59

## 2016-02-01 ENCOUNTER — Encounter: Payer: Self-pay | Admitting: Physical Therapy

## 2016-02-01 DIAGNOSIS — R262 Difficulty in walking, not elsewhere classified: Secondary | ICD-10-CM

## 2016-02-01 DIAGNOSIS — G8929 Other chronic pain: Secondary | ICD-10-CM

## 2016-02-01 DIAGNOSIS — M6281 Muscle weakness (generalized): Secondary | ICD-10-CM

## 2016-02-01 DIAGNOSIS — M25561 Pain in right knee: Secondary | ICD-10-CM

## 2016-02-01 NOTE — Therapy (Signed)
[facility] [address] [place], [address] Phone: [phone]   Fax:  [phone]  Physical Therapy Treatment  Patient Details  Name: Olivia Morgan MRN: [id number] Date of Birth: 28-Jan-1956 Referring Provider: hallows  Encounter Date: 02/01/2016      PT End of Session - 02/02/16 1318    Visit Number 11   Number of Visits 13   Date for PT Re-Evaluation 02/01/16   PT Start Time 1115   PT Stop Time 1200   PT Time Calculation (min) 45 min   Activity Tolerance Patient tolerated treatment well   Behavior During Therapy [facility] for tasks assessed/performed      Past Medical History:  Diagnosis Date  . Bilateral hearing loss    wears hearing aids  . Depression   . Genital herpes   . GERD (gastroesophageal reflux disease)   . Herpes simplex virus infection   . Murmur   . Neuropathy of both feet 06/05/2015  . OA (osteoarthritis) of hip    left  . Plantar fasciitis   . Stomatitis     Past Surgical History:  Procedure Laterality Date  . CESAREAN SECTION    . COLONOSCOPY  2010  . FOOT SURGERY Right   . 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 TOTAL HIP ARTHROPLASTY Left 10/2008  . UPPER GI ENDOSCOPY  1992    There were no vitals filed for this visit.      Subjective Assessment - 02/01/16 1123    Subjective Pt reports that she is having continual R posterior knee pain on this date. She sent the pictures of her posterior knee swelling to the orthopedics office. Per patient report they were not concerned about the swelling but advised her to follow-up if she had additional concerns. Pt reports she is performing HEP. She would like to know when she should expect her pain to start decreasing.    How long can you sit comfortably? sitting for greater than 20 min incr. pain   Diagnostic tests imaging pre and post surgery   Patient Stated Goals return to normal level of activities.  stairs, dancing.   Currently in Pain? Yes   Pain Score 2    Pain Location Knee   Pain Orientation Right;Posterior         TREATMENT  Objective: NuStep L1 x 6 minutes during history (3 minutes unbilled); Assessment of R knee PROM which is full for both extension and flexion; Examined posterior R knee with minimal swelling noted. Not tender to touch and not warm or red.  Sidelying R hip abduction 3x10;  Standing hip extension 3x10 each side, same cuing. Forward step ups on 6" step 2 x 10 performed bilaterally; Side step ups on 6" step x 10 bilaterally; Total gym partial squats 2 x 10 at setting 18; Total gym R single leg squats 2 x 10 at setting 10; R HS stretch 30 second hold x 3;    Pt requires intermittent cues and exercise modification to correct form and minimize pain. Education provided regarding prognosis and expected improvement with respect to both function and pain.                             PT Education - 02/02/16 1318    Education provided Yes   Education Details Continue HEP, expected progress with pain and function s/p partial TKR  Person(s) Educated Patient   Methods Explanation   Comprehension Verbalized understanding             PT Long Term Goals - 12/21/15 1543      PT LONG TERM GOAL #1   Title Pt will be able to ascend stairs step over step   Baseline step to gait    Time 6   Period Weeks   Status New     PT LONG TERM GOAL #2   Title Pt will improve strength to 5/5 in RLE to decr. compensation from back and opposite LE   Baseline 4/5 and pain   Time 6   Period Weeks   Status New     PT LONG TERM GOAL #3   Title Pt will be able to return to dancing with pain no greater than 2/10   Baseline unable to dance   Time 6   Period Weeks   Status New               Plan - 02/02/16 1319    Clinical Impression Statement Pt demonstrates good tolerance for exercise today with minimal increase in pain. No warmth or  redness to mild area of swelling at posterior knee. Modifications provided to exercises to minimize pain. Primary therapist arrived during session and was able to examine swelling in posterior knee to compare to prior sessions. Per primary PT swelling has improved considerably from last session. Pt encouraged to continue HEP as prescribed. Pt provided reassurance for patience with knee pain and expected prognosis for improvement. Outcome measures will need to be repeated and patient recerted at next session as appropriate.    Rehab Potential Good   Clinical Impairments Affecting Rehab Potential motivation, response to previous surgery. Multiple complex orthopedic issues   PT Frequency 2x / week   PT Duration 6 weeks   PT Treatment/Interventions Aquatic Therapy;ADLs/Self Care Home Management;Therapeutic activities;Therapeutic exercise;Neuromuscular re-education;Patient/family education;Dry needling;Passive range of motion;Manual techniques   PT Next Visit Plan Repeat outcome meausures and recert as appropriate   PT Home Exercise Plan As prescribed   Consulted and Agree with Plan of Care Patient      Patient will benefit from skilled therapeutic intervention in order to improve the following deficits and impairments:  Pain, Improper body mechanics, Difficulty walking, Impaired flexibility, Decreased strength  Visit Diagnosis: Difficulty in walking, not elsewhere classified  Chronic pain of right knee  Muscle weakness (generalized)     Problem List Patient Active Problem List   Diagnosis Date Noted  . Vitamin D deficiency 11/11/2015  . Preventative health care 11/11/2015  . Osteopenia 11/11/2015  . Mass of left lower leg 09/07/2015  . Adverse drug reaction 06/13/2015  . Overweight (BMI 25.0-29.9) 06/13/2015  . Greater trochanteric bursitis of left hip 06/05/2015  . Bunion of left foot 06/05/2015  . Neuropathy of both feet 06/05/2015  . Chronic kidney disease, stage II (mild)  04/05/2015  . Thrombocytopenia (HCC) 04/05/2015  . Esophageal reflux 03/19/2015  . Need for shingles vaccine 03/14/2015  . Lower back pain 03/10/2015  . Dysphagia 03/10/2015  . Herpes simplex virus infection   . OA (osteoarthritis) of hip   . GERD (gastroesophageal reflux disease)   . Plantar fasciitis   . Depression   . Genital herpes   . Bilateral hearing loss    Lynnea Maizes PT, DPT   Yossef Gilkison 02/02/2016, 1:26 PM  [place] [facility] PH[facility]TS MEDICINE [address] [place], [place],  [address] Phone: [phone]   Fax:  [phone]  Name: Olivia Morgan MRN: [id number] Date of Birth: 01-24-1956

## 2016-02-04 ENCOUNTER — Ambulatory Visit: Payer: 59 | Admitting: Physical Therapy

## 2016-02-04 DIAGNOSIS — M6281 Muscle weakness (generalized): Secondary | ICD-10-CM

## 2016-02-04 DIAGNOSIS — G8929 Other chronic pain: Secondary | ICD-10-CM

## 2016-02-04 DIAGNOSIS — M25561 Pain in right knee: Secondary | ICD-10-CM

## 2016-02-04 DIAGNOSIS — R262 Difficulty in walking, not elsewhere classified: Secondary | ICD-10-CM

## 2016-02-04 NOTE — Therapy (Signed)
[place] [facility] [address], [place], [address] Phone: [phone]   Fax:  [phone]  Physical Therapy Treatment  Patient Details  Name: Olivia Morgan MRN: [id number] Date of Birth: 1955/07/18 Referring Provider: hallows  Encounter Date: 02/04/2016      PT End of Session - 02/04/16 1211    Visit Number 12   Number of Visits 13   Date for PT Re-Evaluation 02/01/16   PT Start Time 1130   PT Stop Time 1210   PT Time Calculation (min) 40 min   Activity Tolerance Patient tolerated treatment well   Behavior During Therapy [facility] for tasks assessed/performed      Past Medical History:  Diagnosis Date  . Bilateral hearing loss    wears hearing aids  . Depression   . Genital herpes   . GERD (gastroesophageal reflux disease)   . Herpes simplex virus infection   . Murmur   . Neuropathy of both feet 06/05/2015  . OA (osteoarthritis) of hip    left  . Plantar fasciitis   . Stomatitis     Past Surgical History:  Procedure Laterality Date  . CESAREAN SECTION    . COLONOSCOPY  2010  . FOOT SURGERY Right   . 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 UPPER GI ENDOSCOPY  1992    There were no vitals filed for this visit.      Subjective Assessment - 02/04/16 1134    Subjective Pt reports she is doing much better now. she had a significant improvement in her pain following wednesday night, no reason why.   How long can you sit comfortably? sitting for greater than 20 min incr. pain   Diagnostic tests imaging pre and post surgery   Patient Stated Goals return to normal level of activities. stairs, dancing.   Currently in Pain? No/denies                                 PT Education - 02/04/16 1211    Education provided Yes   Education Details progression to incr. WB actiities   Person(s) Educated Patient   Methods  Explanation   Comprehension Verbalized understanding             PT Long Term Goals - 02/04/16 1212      PT LONG TERM GOAL #1   Title Pt will be able to ascend stairs step over step   Baseline able to perform for 3 steps only   Time 6   Period Weeks   Status Partially Met     PT LONG TERM GOAL #2   Title Pt will improve strength to 5/5 in RLE to decr. compensation from back and opposite LE   Baseline 4/5 and pain   Time 6   Period Weeks   Status Not Met     PT LONG TERM GOAL #3   Title Pt will be able to return to dancing with pain no greater than 2/10   Baseline unable to dance   Time 6   Period Weeks   Status Not Met     PT LONG TERM GOAL #4   Title Pt will improve 6MWT from 1000' to 1400' indicating improvement in community level ambulation   Time 6   Period Weeks   Status New  Plan - 02/04/16 1212    Clinical Impression Statement Pt   Rehab Potential Good   Clinical Impairments Affecting Rehab Potential motivation, response to previous surgery. Multiple complex orthopedic issues   PT Frequency 2x / week   PT Duration 6 weeks   PT Treatment/Interventions Aquatic Therapy;ADLs/Self Care Home Management;Therapeutic activities;Therapeutic exercise;Neuromuscular re-education;Patient/family education;Dry needling;Passive range of motion;Manual techniques   PT Next Visit Plan Repeat outcome meausures and recert as appropriate   PT Home Exercise Plan As prescribed   Consulted and Agree with Plan of Care Patient      Patient will benefit from skilled therapeutic intervention in order to improve the following deficits and impairments:  Pain, Improper body mechanics, Difficulty walking, Impaired flexibility, Decreased strength  Visit Diagnosis: Difficulty in walking, not elsewhere classified  Chronic pain of right knee  Muscle weakness (generalized)     Problem List Patient Active Problem List   Diagnosis Date Noted  . Vitamin D  deficiency 11/11/2015  . Preventative health care 11/11/2015  . Osteopenia 11/11/2015  . Mass of left lower leg 09/07/2015  . Adverse drug reaction 06/13/2015  . Overweight (BMI 25.0-29.9) 06/13/2015  . Greater trochanteric bursitis of left hip 06/05/2015  . Bunion of left foot 06/05/2015  . Neuropathy of both feet 06/05/2015  . 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 Thrombocytopenia ([place]) 04/05/2015  . Esophageal reflux 03/19/2015  . Need for shingles vaccine 03/14/2015  . Lower back pain 03/10/2015  . Dysphagia 03/10/2015  . Herpes simplex virus infection   . OA (osteoarthritis) of hip   . GERD (gastroesophageal reflux disease)   . Plantar fasciitis   . Depression   . Genital herpes   . Bilateral hearing loss     Fisher,Benjamin 02/04/2016, 12:13 PM  [place] [facility] [address] [place], [address] Phone: [phone]   Fax:  [phone]  Name: DALTON MILLE MRN: [id number] Date of Birth: 1955-04-17

## 2016-02-07 ENCOUNTER — Ambulatory Visit: Payer: 59 | Admitting: Physical Therapy

## 2016-02-07 DIAGNOSIS — R262 Difficulty in walking, not elsewhere classified: Secondary | ICD-10-CM

## 2016-02-07 DIAGNOSIS — M6281 Muscle weakness (generalized): Secondary | ICD-10-CM | POA: Diagnosis not present

## 2016-02-07 NOTE — Therapy (Signed)
[facility] [address] [place], [address] Phone: [phone]   Fax:  [phone]  Physical Therapy Treatment  Patient Details  Name: Olivia Morgan MRN: [id number] Date of Birth: [birth year] Referring Provider: hallows  Encounter Date: 02/07/2016      PT End of Session - 02/07/16 1030    Visit Number 13   Number of Visits 13   Date for PT Re-Evaluation 02/01/16   PT Start Time 0950   PT Stop Time 1030   PT Time Calculation (min) 40 min   Activity Tolerance Patient tolerated treatment well   Behavior During Therapy [facility] for tasks assessed/performed      Past Medical History:  Diagnosis Date  . Bilateral hearing loss    wears hearing aids  . Depression   . Genital herpes   . GERD (gastroesophageal reflux disease)   . Herpes simplex virus infection   . Murmur   . Neuropathy of both feet 06/05/2015  . OA (osteoarthritis) of hip    left  . Plantar fasciitis   . Stomatitis     Past Surgical History:  Procedure Laterality Date  . CESAREAN SECTION    . COLONOSCOPY  2010  . FOOT SURGERY Right   . REPLACEMENT TOTAL KNEE  3/13   Dr.Hallows at [place] Left 10/2008  . UPPER GI ENDOSCOPY  1992    There were no vitals filed for this visit.      Subjective Assessment - 02/07/16 0952    Subjective Pt reports she has done minimal exercise over the weekend due to a busy weekend, no stiffness today but she noticed some limping yesterday.   How long can you sit comfortably? sitting for greater than 20 min incr. pain   Diagnostic tests imaging pre and post surgery   Patient Stated Goals return to normal level of activities. stairs, dancing.   Currently in Pain? No/denies   Pain Score 0-No pain                  Objective: Focus of session on continuing to strengthen LE as pt continues to demonstrate limp after short bout of walking and has difficulty  with getting up and down from the ground. TG leg press toes up to preferentially activate glutes 3x15 on seat setting 22. Unilateral TG press ups on seat setting 14, initially pt had difficulty and required two legs up single leg down but eventually able to perform more appropriately. OMEGA leg press pyramid 45#, 65#, 85# then return, 10, 8, 6 and return. Pt reported her leg was "tagging pretty good" following this but still able to amb in clinic though now with notable limp. Wall sit - performed at 75 deg. Angle focusing on control and glute activation. Pt reported that this was challenging but not painful.                    PT Long Term Goals - 02/04/16 1212      PT LONG TERM GOAL #1   Title Pt will be able to ascend stairs step over step   Baseline able to perform for 3 steps only   Time 6   Period Weeks   Status Partially Met     PT LONG TERM GOAL #2   Title Pt will improve strength to 5/5 in RLE to decr. compensation from back and opposite LE   Baseline 4/5 and pain  Time 6   Period Weeks   Status Not Met     PT LONG TERM GOAL #3   Title Pt will be able to return to dancing with pain no greater than 2/10   Baseline unable to dance   Time 6   Period Weeks   Status Not Met     PT LONG TERM GOAL #4   Title Pt will improve 6MWT from 1000' to 1400' indicating improvement in community level ambulation   Time 6   Period Weeks   Status New               Plan - 02/07/16 1030    Clinical Impression Statement Pt has continued to make progress with gait, strengthening. involved LE now able to perform SLS on TG at seat setting 11, unable to perform higher than this.    Rehab Potential Good   Clinical Impairments Affecting Rehab Potential motivation, response to previous surgery. Multiple complex orthopedic issues   PT Frequency 2x / week   PT Duration 6 weeks   PT Treatment/Interventions Aquatic Therapy;ADLs/Self Care Home Management;Therapeutic  activities;Therapeutic exercise;Neuromuscular re-education;Patient/family education;Dry needling;Passive range of motion;Manual techniques   PT Next Visit Plan Repeat outcome meausures and recert as appropriate   PT Home Exercise Plan As prescribed   Consulted and Agree with Plan of Care Patient      Patient will benefit from skilled therapeutic intervention in order to improve the following deficits and impairments:  Pain, Improper body mechanics, Difficulty walking, Impaired flexibility, Decreased strength  Visit Diagnosis: Difficulty in walking, not elsewhere classified     Problem List Patient Active Problem List   Diagnosis Date Noted  . Vitamin D deficiency 11/11/2015  . Preventative health care 11/11/2015  . Osteopenia 11/11/2015  . Mass of left lower leg 09/07/2015  . Adverse drug reaction 06/13/2015  . Overweight (BMI 25.0-29.9) 06/13/2015  . Greater trochanteric bursitis of left hip 06/05/2015  . Bunion of left foot 06/05/2015  . Neuropathy of both feet 06/05/2015  . 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 Thrombocytopenia ([place]) 04/05/2015  . Esophageal reflux 03/19/2015  . Need for shingles vaccine 03/14/2015  . Lower back pain 03/10/2015  . Dysphagia 03/10/2015  . Herpes simplex virus infection   . OA (osteoarthritis) of hip   . GERD (gastroesophageal reflux disease)   . Plantar fasciitis   . Depression   . Genital herpes   . Bilateral hearing loss     Pearline Yerby PT DPT 02/07/2016, 11:24 AM  [facility] [address], [place], [address] Phone: [phone]   Fax:  [phone]  Name: HAYDYN LIDDELL MRN: [id number] Date of Birth: 10/02/1955

## 2016-02-09 ENCOUNTER — Ambulatory Visit: Payer: 59 | Attending: Orthopaedic Surgery | Admitting: Physical Therapy

## 2016-02-09 DIAGNOSIS — M6281 Muscle weakness (generalized): Secondary | ICD-10-CM | POA: Diagnosis present

## 2016-02-09 DIAGNOSIS — R262 Difficulty in walking, not elsewhere classified: Secondary | ICD-10-CM | POA: Insufficient documentation

## 2016-02-09 DIAGNOSIS — R2689 Other abnormalities of gait and mobility: Secondary | ICD-10-CM | POA: Diagnosis present

## 2016-02-09 DIAGNOSIS — M79671 Pain in right foot: Secondary | ICD-10-CM | POA: Diagnosis present

## 2016-02-10 NOTE — Addendum Note (Signed)
Addended by: Dorette GrateFISHER, BENJAMIN C on: 02/10/2016 07:52 AM   Modules accepted: Orders

## 2016-02-10 NOTE — Therapy (Signed)
[facility] [address] [place], [address] Phone: [phone]   Fax:  [phone]  Physical Therapy Treatment  Patient Details  Name: Olivia Morgan MRN: [id number] Date of Birth: [birth year] Referring Provider: hallows  Encounter Date: 02/09/2016      PT End of Session - 02/09/16 0755    Visit Number 14   Number of Visits 28   Date for PT Re-Evaluation 03/09/16   PT Start Time 1120   PT Stop Time 1200   PT Time Calculation (min) 40 min   Activity Tolerance Patient tolerated treatment well   Behavior During Therapy [facility] for tasks assessed/performed      Past Medical History:  Diagnosis Date  . Bilateral hearing loss    wears hearing aids  . Depression   . Genital herpes   . GERD (gastroesophageal reflux disease)   . Herpes simplex virus infection   . Murmur   . Neuropathy of both feet 06/05/2015  . OA (osteoarthritis) of hip    left  . Plantar fasciitis   . Stomatitis     Past Surgical History:  Procedure Laterality Date  . CESAREAN SECTION    . COLONOSCOPY  2010  . FOOT SURGERY Right   . REPLACEMENT TOTAL KNEE  3/13   Dr.Hallows at [place] Left 10/2008  . UPPER GI ENDOSCOPY  1992    There were no vitals filed for this visit.      Subjective Assessment - 02/09/16 0754    Subjective Pt reports she has been feeling a little unsteady but otherwise is doing well, was very fatigued followign previous session.   How long can you sit comfortably? sitting for greater than 20 min incr. pain   Diagnostic tests imaging pre and post surgery   Patient Stated Goals return to normal level of activities. stairs, dancing.   Currently in Pain? No/denies                         Objective: Standing hip abductions with ER 3x10. During performance of this noted difficulty with SLS.  Performed SLS as strength training progression, started with B  sit<>stand, then SL sit<>standon elevated surface, then TG SL squats, followed by SLS.  Pt able to perform SBA on both sides, worse on R due to pain per self report, worse on L due to hip weakness per self report. Pt has prior THA on L side so this is a known issue.  amb in clinic x500', then performed SLS again, decr. Performance quality on L, no change on R. Issued this as practice for pt.  Step downs from 4" step with cuing for decr. Knee valgus, performed 3x10 with rest breaks for fatigue, performed one set behind step, one set ahead.  Following session reported no difficulty or pain.             PT Long Term Goals - 02/04/16 1212      PT LONG TERM GOAL #1   Title Pt will be able to ascend stairs step over step   Baseline able to perform for 3 steps only   Time 6   Period Weeks   Status Partially Met     PT LONG TERM GOAL #2   Title Pt will improve strength to 5/5 in RLE to decr. compensation from back and opposite LE   Baseline 4/5 and pain   Time  6   Period Weeks   Status Not Met     PT LONG TERM GOAL #3   Title Pt will be able to return to dancing with pain no greater than 2/10   Baseline unable to dance   Time 6   Period Weeks   Status Not Met     PT LONG TERM GOAL #4   Title Pt will improve 6MWT from 1000' to 1400' indicating improvement in community level ambulation   Time 6   Period Weeks   Status New               Plan - 02/09/16 0756    Clinical Impression Statement Pt demonstrates difficulty with SLS on involved LE, she feels she had this prior to her surgery but it is significant and indicates a greater risk for falls. would benefit from some focus on this at next session.    Rehab Potential Good   Clinical Impairments Affecting Rehab Potential motivation, response to previous surgery. Multiple complex orthopedic issues   PT Frequency 2x / week   PT Duration 6 weeks   PT Treatment/Interventions Aquatic Therapy;ADLs/Self Care Home  Management;Therapeutic activities;Therapeutic exercise;Neuromuscular re-education;Patient/family education;Dry needling;Passive range of motion;Manual techniques   PT Next Visit Plan Repeat outcome meausures and recert as appropriate   PT Home Exercise Plan As prescribed   Consulted and Agree with Plan of Care Patient      Patient will benefit from skilled therapeutic intervention in order to improve the following deficits and impairments:  Pain, Improper body mechanics, Difficulty walking, Impaired flexibility, Decreased strength  Visit Diagnosis: Muscle weakness (generalized)  Difficulty in walking, not elsewhere classified     Problem List Patient Active Problem List   Diagnosis Date Noted  . Vitamin D deficiency 11/11/2015  . Preventative health care 11/11/2015  . Osteopenia 11/11/2015  . Mass of left lower leg 09/07/2015  . Adverse drug reaction 06/13/2015  . Overweight (BMI 25.0-29.9) 06/13/2015  . Greater trochanteric bursitis of left hip 06/05/2015  . Bunion of left foot 06/05/2015  . Neuropathy of both feet 06/05/2015  . 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 Thrombocytopenia ([place]) 04/05/2015  . Esophageal reflux 03/19/2015  . Need for shingles vaccine 03/14/2015  . Lower back pain 03/10/2015  . Dysphagia 03/10/2015  . Herpes simplex virus infection   . OA (osteoarthritis) of hip   . GERD (gastroesophageal reflux disease)   . Plantar fasciitis   . Depression   . Genital herpes   . Bilateral hearing loss     [name] PT DPT 02/10/2016, 7:59 AM  [facility] [address], [place], [address] Phone: [phone]   Fax:  [phone]  Name: Olivia Morgan MRN: [id number] Date of Birth: 03-Aug-1955

## 2016-02-14 ENCOUNTER — Ambulatory Visit: Payer: 59 | Admitting: Physical Therapy

## 2016-02-22 ENCOUNTER — Ambulatory Visit (INDEPENDENT_AMBULATORY_CARE_PROVIDER_SITE_OTHER): Payer: 59 | Admitting: Podiatry

## 2016-02-22 DIAGNOSIS — T25522A Corrosion of first degree of left foot, initial encounter: Secondary | ICD-10-CM

## 2016-02-22 DIAGNOSIS — M79671 Pain in right foot: Secondary | ICD-10-CM

## 2016-02-22 DIAGNOSIS — M722 Plantar fascial fibromatosis: Secondary | ICD-10-CM | POA: Diagnosis not present

## 2016-02-22 MED ORDER — SILVER SULFADIAZINE 1 % EX CREA
1.0000 | TOPICAL_CREAM | Freq: Every day | CUTANEOUS | 1 refills | Status: DC
Start: 2016-02-22 — End: 2016-04-05

## 2016-02-22 MED ORDER — NONFORMULARY OR COMPOUNDED ITEM: 1.0000 g | Freq: Four times a day (QID) | 2 refills | Status: DC

## 2016-02-24 ENCOUNTER — Ambulatory Visit: Payer: 59 | Admitting: Physical Therapy

## 2016-02-24 ENCOUNTER — Telehealth: Payer: Self-pay | Admitting: *Deleted

## 2016-02-24 DIAGNOSIS — M6281 Muscle weakness (generalized): Secondary | ICD-10-CM | POA: Diagnosis not present

## 2016-02-24 DIAGNOSIS — M722 Plantar fascial fibromatosis: Secondary | ICD-10-CM

## 2016-02-24 DIAGNOSIS — R262 Difficulty in walking, not elsewhere classified: Secondary | ICD-10-CM

## 2016-02-24 NOTE — Therapy (Signed)
[facility] [address], [place], [address] Phone: [phone]   Fax:  [phone]  Physical Therapy Treatment  Patient Details  Name: Olivia Morgan MRN: [id number] Date of Birth: 09/19/1955 Referring Provider: hallows  Encounter Date: 02/24/2016      PT End of Session - 02/24/16 0932    Visit Number 15   Number of Visits 28   Date for PT Re-Evaluation 03/09/16   PT Start Time 0845   PT Stop Time 0930   PT Time Calculation (min) 45 min   Activity Tolerance Patient tolerated treatment well   Behavior During Therapy [facility] for tasks assessed/performed      Past Medical History:  Diagnosis Date  . Bilateral hearing loss    wears hearing aids  . Depression   . Genital herpes   . GERD (gastroesophageal reflux disease)   . Herpes simplex virus infection   . Murmur   . Neuropathy of both feet 06/05/2015  . OA (osteoarthritis) of hip    left  . Plantar fasciitis   . Stomatitis     Past Surgical History:  Procedure Laterality Date  . CESAREAN SECTION    . COLONOSCOPY  2010  . FOOT SURGERY Right   . REPLACEMENT TOTAL KNEE  3/13   Dr.Hallows at [place] Left 10/2008  . UPPER GI ENDOSCOPY  1992    There were no vitals filed for this visit.      Subjective Assessment - 02/24/16 0858    Subjective Pt reports she has had a significant flareup of plantar fasciitis which is limiting her ability to perform her exercises. She was seen by her MD who is pleased by her progress in her knee   How long can you sit comfortably? sitting for greater than 20 min incr. pain   Diagnostic tests imaging pre and post surgery   Patient Stated Goals return to normal level of activities. stairs, dancing.   Currently in Pain? Yes   Pain Score 2    Pain Location Foot   Pain Orientation Right                Objective: Focus of session on treating painful plantar fasciitis  to allow pt to perform regular HEP. Did not resolve with treatment so encouraged pt to get referral for this and will address in future sessions. Will look to d/c pt from current episode at next session.  Performed STM, graston, manual stretching for lateral gastroc, plantar fascia, performed extensively and educated pt on self performance of manual stretch for reducing pain. Pt reported pain with gait decr. From 5/10 to 2/10 following tx.                    PT Education - 02/24/16 0859    Education provided Yes   Education Details self treatment of plantar fasciitis   Person(s) Educated Patient   Methods Explanation   Comprehension Verbalized understanding             PT Long Term Goals - 02/04/16 1212      PT LONG TERM GOAL #1   Title Pt will be able to ascend stairs step over step   Baseline able to perform for 3 steps only   Time 6   Period Weeks   Status Partially Met     PT LONG TERM GOAL #2   Title Pt will improve strength to  5/5 in RLE to decr. compensation from back and opposite LE   Baseline 4/5 and pain   Time 6   Period Weeks   Status Not Met     PT LONG TERM GOAL #3   Title Pt will be able to return to dancing with pain no greater than 2/10   Baseline unable to dance   Time 6   Period Weeks   Status Not Met     PT LONG TERM GOAL #4   Title Pt will improve 6MWT from 1000' to 1400' indicating improvement in community level ambulation   Time 6   Period Weeks   Status New               Plan - 02/24/16 0932    Clinical Impression Statement Pt is limited    Rehab Potential Good   Clinical Impairments Affecting Rehab Potential motivation, response to previous surgery. Multiple complex orthopedic issues   PT Frequency 2x / week   PT Duration 6 weeks   PT Treatment/Interventions Aquatic Therapy;ADLs/Self Care Home Management;Therapeutic activities;Therapeutic exercise;Neuromuscular re-education;Patient/family education;Dry  needling;Passive range of motion;Manual techniques   PT Next Visit Plan Repeat outcome meausures and recert as appropriate   PT Home Exercise Plan As prescribed   Consulted and Agree with Plan of Care Patient      Patient will benefit from skilled therapeutic intervention in order to improve the following deficits and impairments:  Pain, Improper body mechanics, Difficulty walking, Impaired flexibility, Decreased strength  Visit Diagnosis: Muscle weakness (generalized)  Difficulty in walking, not elsewhere classified     Problem List Patient Active Problem List   Diagnosis Date Noted  . Vitamin D deficiency 11/11/2015  . Preventative health care 11/11/2015  . Osteopenia 11/11/2015  . Mass of left lower leg 09/07/2015  . Adverse drug reaction 06/13/2015  . Overweight (BMI 25.0-29.9) 06/13/2015  . Greater trochanteric bursitis of left hip 06/05/2015  . Bunion of left foot 06/05/2015  . Neuropathy of both feet 06/05/2015  . 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 Thrombocytopenia ([place]) 04/05/2015  . Esophageal reflux 03/19/2015  . Need for shingles vaccine 03/14/2015  . Lower back pain 03/10/2015  . Dysphagia 03/10/2015  . Herpes simplex virus infection   . OA (osteoarthritis) of hip   . GERD (gastroesophageal reflux disease)   . Plantar fasciitis   . Depression   . Genital herpes   . Bilateral hearing loss     Fisher,Benjamin PT DPT 02/24/2016, 9:35 AM  [facility] [address] [place], [address] Phone: [phone]   Fax:  [phone]  Name: Olivia Morgan MRN: [id number] Date of Birth: Aug 26, 1955

## 2016-02-24 NOTE — Telephone Encounter (Addendum)
Pt states she is taking PT for her knee and would like to have PT for her plantar fasciitis at the same facility [facility] Su HoffBen Fisher [phone]/28/2016-Left message informing pt I would fax PT orders for PT once, I had a the fax number for the facility. Left message requesting fax to order PT.

## 2016-02-26 NOTE — Telephone Encounter (Signed)
Yes please

## 2016-02-28 ENCOUNTER — Ambulatory Visit: Payer: 59 | Admitting: Physical Therapy

## 2016-02-28 DIAGNOSIS — M6281 Muscle weakness (generalized): Secondary | ICD-10-CM

## 2016-02-29 NOTE — Therapy (Signed)
[facility] [address] [place], [address] Phone: [phone]   Fax:  [phone]  Physical Therapy Treatment  Patient Details  Name: Olivia Morgan MRN: [id number] Date of Birth: 1955-11-19 Referring Provider: hallows  Encounter Date: 02/28/2016      PT End of Session - 02/28/16 1543    Visit Number 16   Number of Visits 28   Date for PT Re-Evaluation 03/09/16   PT Start Time 8372   PT Stop Time 1610   PT Time Calculation (min) 40 min   Activity Tolerance Patient tolerated treatment well   Behavior During Therapy [facility] for tasks assessed/performed      Past Medical History:  Diagnosis Date  . Bilateral hearing loss    wears hearing aids  . Depression   . Genital herpes   . GERD (gastroesophageal reflux disease)   . Herpes simplex virus infection   . Murmur   . Neuropathy of both feet 06/05/2015  . OA (osteoarthritis) of hip    left  . Plantar fasciitis   . Stomatitis     Past Surgical History:  Procedure Laterality Date  . CESAREAN SECTION    . COLONOSCOPY  2010  . FOOT SURGERY Right   . 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 UPPER GI ENDOSCOPY  1992    There were no vitals filed for this visit.      Subjective Assessment - 02/28/16 1535    Subjective Pt reports continued plantar fasciitis pain. no difficulty with knee.   How long can you sit comfortably? sitting for greater than 20 min incr. pain   Diagnostic tests imaging pre and post surgery   Patient Stated Goals return to normal level of activities. stairs, dancing.   Currently in Pain? Yes   Pain Score 5    Pain Location Knee             Objective: LEFS MMT ROM all assessed, MMT, ROM WNL, LEFS 64, appropriate end goal for post UKA.  Amb in clinic multiple rounds including 6MWT  6MWT 1480'.  Reviewed HEP, corrected pt to have decr. Depth with  her squat.  Performed lunges 3x10 each side, limited by plantar fasciitis pain.  Deferred additional PT due to incr. PF pain.                    PT Education - 02/28/16 1543    Education provided Yes   Education Details discharge instructions   Person(s) Educated Patient   Methods Explanation             PT Long Term Goals - 02/04/16 1212      PT LONG TERM GOAL #1   Title Pt will be able to ascend stairs step over step   Baseline able to perform for 3 steps only   Time 6   Period Weeks   Status Partially Met     PT LONG TERM GOAL #2   Title Pt will improve strength to 5/5 in RLE to decr. compensation from back and opposite LE   Baseline 4/5 and pain   Time 6   Period Weeks   Status Not Met     PT LONG TERM GOAL #3   Title Pt will be able to return to dancing with pain no greater than 2/10   Baseline unable to dance  Time 6   Period Weeks   Status Not Met     PT LONG TERM GOAL #4   Title Pt will improve 6MWT from 1000' to 1400' indicating improvement in community level ambulation   Time 6   Period Weeks   Status New               Plan - 02/28/16 1550    Clinical Impression Statement Pt is appropriate for d/c at this time due to having met goals. She is limited in ability to perform certain activities due to recent flareup of plantar fasciitis. Pt will be discharged from care for her knee and picked up for plantar fasciitis to be followed by podiatrist. Strength is returning, ROM is superb and pt has no questions at this time.   Rehab Potential Good   Clinical Impairments Affecting Rehab Potential motivation, response to previous surgery. Multiple complex orthopedic issues   PT Frequency 2x / week   PT Duration 6 weeks   PT Treatment/Interventions Aquatic Therapy;ADLs/Self Care Home Management;Therapeutic activities;Therapeutic exercise;Neuromuscular re-education;Patient/family education;Dry needling;Passive range of motion;Manual  techniques   PT Next Visit Plan Repeat outcome meausures and recert as appropriate   PT Home Exercise Plan As prescribed   Consulted and Agree with Plan of Care Patient      Patient will benefit from skilled therapeutic intervention in order to improve the following deficits and impairments:  Pain, Improper body mechanics, Difficulty walking, Impaired flexibility, Decreased strength  Visit Diagnosis: Muscle weakness (generalized)     Problem List Patient Active Problem List   Diagnosis Date Noted  . Vitamin D deficiency 11/11/2015  . Preventative health care 11/11/2015  . Osteopenia 11/11/2015  . Mass of left lower leg 09/07/2015  . Adverse drug reaction 06/13/2015  . Overweight (BMI 25.0-29.9) 06/13/2015  . Greater trochanteric bursitis of left hip 06/05/2015  . Bunion of left foot 06/05/2015  . Neuropathy of both feet 06/05/2015  . 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 Thrombocytopenia ([place]) 04/05/2015  . Esophageal reflux 03/19/2015  . Need for shingles vaccine 03/14/2015  . Lower back pain 03/10/2015  . Dysphagia 03/10/2015  . Herpes simplex virus infection   . OA (osteoarthritis) of hip   . GERD (gastroesophageal reflux disease)   . Plantar fasciitis   . Depression   . Genital herpes   . Bilateral hearing loss     Terriana Barreras PT DPT 02/29/2016, 6:43 AM  [facility] [address] [place], [address] Phone: [phone]   Fax:  [phone]  Name: Olivia Morgan MRN: [id number] Date of Birth: 1955-11-30

## 2016-03-05 MED ORDER — BETAMETHASONE SOD PHOS & ACET 6 (3-3) MG/ML IJ SUSP: 3.0000 mg | Freq: Once | INTRAMUSCULAR | Status: DC
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
## 2016-03-07 ENCOUNTER — Encounter: Payer: Self-pay | Admitting: Physical Therapy

## 2016-03-07 ENCOUNTER — Ambulatory Visit: Payer: 59

## 2016-03-07 DIAGNOSIS — M79671 Pain in right foot: Secondary | ICD-10-CM

## 2016-03-07 DIAGNOSIS — M6281 Muscle weakness (generalized): Secondary | ICD-10-CM | POA: Diagnosis not present

## 2016-03-07 DIAGNOSIS — R2689 Other abnormalities of gait and mobility: Secondary | ICD-10-CM

## 2016-03-08 NOTE — Therapy (Signed)
[place] [facility] [address], [place], [address] Phone: [phone]   Fax:  [phone]  Physical Therapy Evaluation  Patient Details  Name: Olivia Morgan MRN: [id number] Date of Birth: Nov 23, 1955 Referring Provider: Daylene Katayama  Encounter Date: 03/07/2016      PT End of Session - 03/07/16 1721    Visit Number 1   Number of Visits 13   Date for PT Re-Evaluation 2016-04-24   Authorization Type g codes   PT Start Time 1125   PT Stop Time 1200   PT Time Calculation (min) 35 min   Activity Tolerance Patient tolerated treatment well   Behavior During Therapy [facility] for tasks assessed/performed      Past Medical History:  Diagnosis Date  . Bilateral hearing loss    wears hearing aids  . Depression   . Genital herpes   . GERD (gastroesophageal reflux disease)   . Herpes simplex virus infection   . Murmur   . Neuropathy of both feet 06/05/2015  . OA (osteoarthritis) of hip    left  . Plantar fasciitis   . Stomatitis     Past Surgical History:  Procedure Laterality Date  . CESAREAN SECTION    . COLONOSCOPY  2010  . FOOT SURGERY Right   . 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 UPPER GI ENDOSCOPY  1992    There were no vitals filed for this visit.       Subjective Assessment - 03/07/16 1124    Subjective R plantar fasciitis   Pertinent History Pt reports plantarfasciitis for multiple years. It has been aggravated since her R knee replacement. It really started to hurt 02/18/16. She went to see her podiatrist who gave her an injection in her R heel on 02/22/16. Pt was also given an arch support brace without improvement. She has used night splints in the past without improvement. She has a history of L hip replacement with L lateral foot numbness following which has continued. Pt also reports history of L plantarfasciitis but it is not  currently bothering her. Pt reports L low back/hip muscle strain on 03/03/16 which is bothering her at this time.    How long can you walk comfortably? Unlimited time but with increased pain   Diagnostic tests Radiographs, pt denies heel spurs   Patient Stated Goals Decrease pain and improve ability to walk.    Currently in Pain? Yes   Pain Score 2   Worst: 10/10, Best: 2/10   Pain Location Foot   Pain Orientation Right   Pain Descriptors / Indicators Aching   Pain Type Chronic pain   Pain Radiating Towards None   Pain Onset More than a month ago   Pain Frequency Intermittent   Aggravating Factors  walking around barefoot, wearing dress shoes   Pain Relieving Factors Rolling on frozen water bottle, stretches, Mobic   Multiple Pain Sites Yes  Low back pain            [facility] PT Assessment - 03/07/16 1127      Assessment   Medical Diagnosis Right plantar fasciitis   Referring Provider Daylene Katayama   Onset Date/Surgical Date 02/18/16   Next MD Visit Not reported   Prior Therapy Yes for R knee, no PT for plantar fascittis     Precautions   Precautions None     Restrictions  Weight Bearing Restrictions No     Balance Screen   Has the patient fallen in the past 6 months No   Has the patient had a decrease in activity level because of a fear of falling?  No   Is the patient reluctant to leave their home because of a fear of falling?  No     Home Ec[employer] residence     Prior Function   Level of Independence Independent   Vocation Full time employment   Vocation Requirements sitting/labwork.   Leisure dancing, walking, staying active     Cognition   Overall Cognitive Status Within Functional Limits for tasks assessed     Observation/Other Assessments   Skin Integrity --     Sensation   Additional Comments Pt reports some decreased senation to lateral L foot which is chronic s/p L THA     Posture/Postural Control   Posture Comments  Full posture screening deferred due to time constraints     AROM   Overall AROM Comments Pt demonstrates moderate to high arch in NWB position. With standing demonstrates moderate to severe arch collapse with significant navicular drop     PROM   Overall PROM Comments Hip PROM: RLE: IR/ER: 35/60 respectively, painless. Good hip extension PROM bilaterally. Ankle DF (in weighted) 45 degrees LLE, 50 degrees RLE;     Strength   Overall Strength Comments hip flexion both 4/5, Knee extension/flexion 5/5 LLE, RLE: 5/5 extension, 4+/5 flexion, ankle DF 5/5 bilateral. Hip IR/ER grossly 4/5 bilateral; hip extension: 4-/5 bilaterally. Unable to assess pelvic control and strength due to time constraints (pt arrived late for appointment)     Palpation   Patella mobility --   Palpation comment Pt with pain to palpation along plantarfascia near insertion at medial calcaneus. Tender on LLE as well in same area but less so. Pt reports some mild pain along plantarfascia near midfoot on the R side but not overly significant. Good talocrural mobility with AP/PA mobility testing. Good mid and forefoot mobility with good AP mobility of metatarsals. No pain reported with hyperextension of R great toe. No pain with palpation along peroneus longus and brevis and near base of 5th metatarsal.      Ambulation/Gait   Assistive device --   Gait Pattern --   Gait velocity --   Gait Comments Pt demonstrates moderate to severe pronation during ambulation.                            PT Education - 03/07/16 1721    Education provided Yes   Education Details HEP, plan of care   Person(s) Educated Patient   Methods Explanation   Comprehension Verbalized understanding             PT Long Term Goals - 02/04/16 1212      PT LONG TERM GOAL #1   Title Pt will be able to ascend stairs step over step   Baseline able to perform for 3 steps only   Time 6   Period Weeks   Status Partially Met      PT LONG TERM GOAL #2   Title Pt will improve strength to 5/5 in RLE to decr. compensation from back and opposite LE   Baseline 4/5 and pain   Time 6   Period Weeks   Status Not Met     PT LONG TERM GOAL #3   Title  Pt will be able to return to dancing with pain no greater than 2/10   Baseline unable to dance   Time 6   Period Weeks   Status Not Met     PT LONG TERM GOAL #4   Title Pt will improve 6MWT from 1000' to 1400' indicating improvement in community level ambulation   Time 6   Period Weeks   Status New               Plan - 03/07/16 1722    Clinical Impression Statement Pt arrived late for her appointment so examination is somewhat limited. She presents with R plantarfascia pain with palpation at midfoot but most severely near medial tubercle of calcaneus. No pain with palpation along peroneus longus and brevis tendons. Full strength with ankle dorsiflexion and plantarflexion. She demonstrates moderate to severe arch collapse in WB position with moderate pronation with barefoot ambulation. Pt demonstrates good midfoot and forefoot mobility. She also demonstrates good talocrural mobility and good gastroc and soleus length. Pt reports she has been taking Mobic recently and has noticed and improvement in her plantar fascia pain. Pt provided home exercise program including modalities for pain control, activity modification, and stretches. She will benefit from skilled PT services to address deficits in pain and mobility related to plantar fasciitis in order to return to pain-free function at home.    Rehab Potential Good   Clinical Impairments Affecting Rehab Potential Positive: improving pain, age, motivation; Negative: chronicity   PT Frequency 2x / week   PT Duration 6 weeks   PT Treatment/Interventions Aquatic Therapy;ADLs/Self Care Home Management;Therapeutic activities;Therapeutic exercise;Neuromuscular re-education;Patient/family education;Dry needling;Passive range of  motion;Manual techniques;Cryotherapy;Electrical Stimulation;Iontophoresis 44m/ml Dexamethasone;Moist Heat;Traction;Ultrasound;Gait training;Balance training   PT Next Visit Plan Assess pelvic control, STM to plantarfascia, intrinsic foot strengthening, stretching   PT Home Exercise Plan Rolling foot on frozen water bottle, gastroc step stretches, continue with plantar fasciitis hard plastic shoe inserts, consider night splints   Consulted and Agree with Plan of Care Patient      Patient will benefit from skilled therapeutic intervention in order to improve the following deficits and impairments:  Pain, Difficulty walking, Abnormal gait, Improper body mechanics  Visit Diagnosis: Pain in right foot - Plan: PT plan of care cert/re-cert  Other abnormalities of gait and mobility - Plan: PT plan of care cert/re-cert     Problem List Patient Active Problem List   Diagnosis Date Noted  . Vitamin D deficiency 11/11/2015  . Preventative health care 11/11/2015  . Osteopenia 11/11/2015  . Mass of left lower leg 09/07/2015  . Adverse drug reaction 06/13/2015  . Overweight (BMI 25.0-29.9) 06/13/2015  . Greater trochanteric bursitis of left hip 06/05/2015  . Bunion of left foot 06/05/2015  . Neuropathy of both feet 06/05/2015  . 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 Thrombocytopenia ([place] 04/05/2015  . Esophageal reflux 03/19/2015  . Need for shingles vaccine 03/14/2015  . Lower back pain 03/10/2015  . Dysphagia 03/10/2015  . Herpes simplex virus infection   . OA (osteoarthritis) of hip   . GERD (gastroesophageal reflux disease)   . Plantar fasciitis   . Depression   . Genital herpes   . Bilateral hearing loss    JPhillips GroutPT, DPT   Reilley Valentine 03/08/2016, 9:31 AM  [facility] [address] [place] [address]Phone: [phone]  Fax:  [phone] Name: Olivia MATSONMRN:  [id number]  Date of Birth: [birth year]

## 2016-03-09 ENCOUNTER — Ambulatory Visit: Payer: 59 | Admitting: Physical Therapy

## 2016-03-09 DIAGNOSIS — M6281 Muscle weakness (generalized): Secondary | ICD-10-CM | POA: Diagnosis not present

## 2016-03-09 DIAGNOSIS — M79671 Pain in right foot: Secondary | ICD-10-CM

## 2016-03-09 NOTE — Therapy (Signed)
[place] [facility] [address], [place], [address] Phone: [phone]   Fax:  [phone]  Physical Therapy Treatment  Patient Details  Name: Olivia Morgan MRN: [id number] Date of Birth: Jul 28, 1955 Referring Provider: Daylene Katayama  Encounter Date: 03/09/2016      PT End of Session - 03/09/16 0954    Visit Number 2   Number of Visits 13   Date for PT Re-Evaluation Apr 26, 2016   Authorization Type g codes   PT Start Time 0925   PT Stop Time 1008   PT Time Calculation (min) 43 min   Activity Tolerance Patient tolerated treatment well   Behavior During Therapy [facility] for tasks assessed/performed      Past Medical History:  Diagnosis Date  . Bilateral hearing loss    wears hearing aids  . Depression   . Genital herpes   . GERD (gastroesophageal reflux disease)   . Herpes simplex virus infection   . Murmur   . Neuropathy of both feet 06/05/2015  . OA (osteoarthritis) of hip    left  . Plantar fasciitis   . Stomatitis     Past Surgical History:  Procedure Laterality Date  . CESAREAN SECTION    . COLONOSCOPY  2008-06-13  . FOOT SURGERY Right   . 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 UPPER GI ENDOSCOPY  1992    There were no vitals filed for this visit.      Subjective Assessment - 03/09/16 0953    Subjective "I'm doing well today". pt reports mild pain.    Pertinent History Pt reports plantarfasciitis for multiple years. It has been aggravated since her R knee replacement. It really started to hurt 02/18/16. She went to see her podiatrist who gave her an injection in her R heel on 02/22/16. Pt was also given an arch support brace without improvement. She has used night splints in the past without improvement. She has a history of L hip replacement with L lateral foot numbness following which has continued. Pt also reports history of L  plantarfasciitis but it is not currently bothering her. Pt reports L low back/hip muscle strain on 03/03/16 which is bothering her at this time.    How long can you walk comfortably? Unlimited time but with increased pain   Diagnostic tests Radiographs, pt denies heel spurs   Patient Stated Goals Decrease pain and improve ability to walk.    Currently in Pain? Yes   Pain Score 2    Pain Location Foot   Pain Orientation Right   Pain Onset More than a month ago              Objective: graston on gastroc, plantar fascia  Standing glute activation to elevate arch  Seated stretch for great toe  Seated resisted isometric activation for FDS                   PT Education - 03/09/16 0954    Education provided Yes   Education Details resisted isometrics.   Person(s) Educated Patient   Methods Explanation   Comprehension Verbalized understanding             PT Long Term Goals - 02/04/16 1212      PT LONG TERM GOAL #1   Title Pt will be able to ascend stairs step over step   Baseline able to  perform for 3 steps only   Time 6   Period Weeks   Status Partially Met     PT LONG TERM GOAL #2   Title Pt will improve strength to 5/5 in RLE to decr. compensation from back and opposite LE   Baseline 4/5 and pain   Time 6   Period Weeks   Status Not Met     PT LONG TERM GOAL #3   Title Pt will be able to return to dancing with pain no greater than 2/10   Baseline unable to dance   Time 6   Period Weeks   Status Not Met     PT LONG TERM GOAL #4   Title Pt will improve 6MWT from 1000' to 1400' indicating improvement in community level ambulation   Time 6   Period Weeks   Status New               Plan - 03/09/16 1008    Clinical Impression Statement Pt has definite trigger points in R QP and FD muscles, as well as in lateral gastroc. Pt also has poor glute tone in standing which when improved also improves ability    Rehab Potential Good    Clinical Impairments Affecting Rehab Potential Positive: improving pain, age, motivation; Negative: chronicity   PT Frequency 2x / week   PT Duration 6 weeks   PT Treatment/Interventions Aquatic Therapy;ADLs/Self Care Home Management;Therapeutic activities;Therapeutic exercise;Neuromuscular re-education;Patient/family education;Dry needling;Passive range of motion;Manual techniques;Cryotherapy;Electrical Stimulation;Iontophoresis 77m/ml Dexamethasone;Moist Heat;Traction;Ultrasound;Gait training;Balance training   PT Next Visit Plan Assess pelvic control, STM to plantarfascia, intrinsic foot strengthening, stretching   PT Home Exercise Plan Rolling foot on frozen water bottle, gastroc step stretches, continue with plantar fasciitis hard plastic shoe inserts, consider night splints   Consulted and Agree with Plan of Care Patient      Patient will benefit from skilled therapeutic intervention in order to improve the following deficits and impairments:  Pain, Difficulty walking, Abnormal gait, Improper body mechanics  Visit Diagnosis: Pain in right foot     Problem List Patient Active Problem List   Diagnosis Date Noted  . Vitamin D deficiency 11/11/2015  . Preventative health care 11/11/2015  . Osteopenia 11/11/2015  . Mass of left lower leg 09/07/2015  . Adverse drug reaction 06/13/2015  . Overweight (BMI 25.0-29.9) 06/13/2015  . Greater trochanteric bursitis of left hip 06/05/2015  . Bunion of left foot 06/05/2015  . Neuropathy of both feet 06/05/2015  . 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 Thrombocytopenia ([place] 04/05/2015  . Esophageal reflux 03/19/2015  . Need for shingles vaccine 03/14/2015  . Lower back pain 03/10/2015  . Dysphagia 03/10/2015  . Herpes simplex virus infection   . OA (osteoarthritis) of hip   . GERD (gastroesophageal reflux disease)   . Plantar fasciitis   . Depression   . Genital herpes   . Bilateral hearing loss     Nimesh Riolo  PT DPT 03/09/2016, 11:55 AM  [facility] [address] [place] [address]Phone: [phone]  Fax:  [phone] Name: Olivia HELLINGMRN: [id number] of Birth: 625-Jun-1957

## 2016-03-10 ENCOUNTER — Ambulatory Visit: Payer: 59 | Admitting: Physical Therapy

## 2016-03-17 ENCOUNTER — Ambulatory Visit: Payer: 59 | Attending: Podiatry | Admitting: Physical Therapy

## 2016-03-17 DIAGNOSIS — M79671 Pain in right foot: Secondary | ICD-10-CM | POA: Insufficient documentation

## 2016-03-17 NOTE — Therapy (Signed)
[facility] [address], [address] [place], [address] Phone: [phone]   Fax:  [phone]  Physical Therapy Treatment  Patient Details  Name: Olivia Morgan MRN: [id number] Date of Birth: 10/04/1955 Referring Provider: Daylene Katayama  Encounter Date: 03/17/2016      PT End of Session - 03/17/16 0912    Visit Number 3   Number of Visits 13   Date for PT Re-Evaluation 05-12-16   Authorization Type g codes   PT Start Time 0908   PT Stop Time 0931   PT Time Calculation (min) 23 min   Activity Tolerance Patient tolerated treatment well   Behavior During Therapy [facility] for tasks assessed/performed      Past Medical History:  Diagnosis Date  . Bilateral hearing loss    wears hearing aids  . Depression   . Genital herpes   . GERD (gastroesophageal reflux disease)   . Herpes simplex virus infection   . Murmur   . Neuropathy of both feet 06/05/2015  . OA (osteoarthritis) of hip    left  . Plantar fasciitis   . Stomatitis     Past Surgical History:  Procedure Laterality Date  . CESAREAN SECTION    . COLONOSCOPY  2008/05/28  . FOOT SURGERY Right   . 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 UPPER GI ENDOSCOPY  1992    There were no vitals filed for this visit.      Subjective Assessment - 03/17/16 0911    Subjective Pt reports continued improvement in symptoms.    Pertinent History Pt reports plantarfasciitis for multiple years. It has been aggravated since her R knee replacement. It really started to hurt 02/18/16. She went to see her podiatrist who gave her an injection in her R heel on 02/22/16. Pt was also given an arch support brace without improvement. She has used night splints in the past without improvement. She has a history of L hip replacement with L lateral foot numbness following which has continued. Pt also reports history of L  plantarfasciitis but it is not currently bothering her. Pt reports L low back/hip muscle strain on 03/03/16 which is bothering her at this time.    How long can you walk comfortably? Unlimited time but with increased pain   Diagnostic tests Radiographs, pt denies heel spurs   Patient Stated Goals Decrease pain and improve ability to walk.    Currently in Pain? Yes   Pain Score 1    Pain Onset More than a month ago                Objective: Graston tool #2 IASTM utilizing sweep, fan and j stroke techniques. Incr. Pain and roughness noted on medial calcaneal tubercle and through medial component of arch.  Following this pt reported incr. Tenderness to palpation but decr. Pain with gait.  Standing, instructed in and had pt perform doming technique for improved plantar arch, glute activation. Pt able to perform in seated, limited in standing. Encouraged pt to perform this at home but not to progress to standing yet due to poor performance.                 PT Education - 03/17/16 0912    Education provided Yes   Education Details doming exercise, glute activation HEP   Person(s) Educated Patient   Methods Explanation   Comprehension  Verbalized understanding             PT Long Term Goals - 02/04/16 1212      PT LONG TERM GOAL #1   Title Pt will be able to ascend stairs step over step   Baseline able to perform for 3 steps only   Time 6   Period Weeks   Status Partially Met     PT LONG TERM GOAL #2   Title Pt will improve strength to 5/5 in RLE to decr. compensation from back and opposite LE   Baseline 4/5 and pain   Time 6   Period Weeks   Status Not Met     PT LONG TERM GOAL #3   Title Pt will be able to return to dancing with pain no greater than 2/10   Baseline unable to dance   Time 6   Period Weeks   Status Not Met     PT LONG TERM GOAL #4   Title Pt will improve 6MWT from 1000' to 1400' indicating improvement in community level ambulation    Time 6   Period Weeks   Status New               Plan - 03/17/16 0912    Clinical Impression Statement Pt continues to make improvement in pain, however she continues to have significant muscle weakness in hip and muscles of plantar region.   Rehab Potential Good   Clinical Impairments Affecting Rehab Potential Positive: improving pain, age, motivation; Negative: chronicity   PT Frequency 2x / week   PT Duration 6 weeks   PT Treatment/Interventions Aquatic Therapy;ADLs/Self Care Home Management;Therapeutic activities;Therapeutic exercise;Neuromuscular re-education;Patient/family education;Dry needling;Passive range of motion;Manual techniques;Cryotherapy;Electrical Stimulation;Iontophoresis 19m/ml Dexamethasone;Moist Heat;Traction;Ultrasound;Gait training;Balance training   PT Next Visit Plan Assess pelvic control, STM to plantarfascia, intrinsic foot strengthening, stretching   PT Home Exercise Plan Rolling foot on frozen water bottle, gastroc step stretches, continue with plantar fasciitis hard plastic shoe inserts, consider night splints   Consulted and Agree with Plan of Care Patient      Patient will benefit from skilled therapeutic intervention in order to improve the following deficits and impairments:  Pain, Difficulty walking, Abnormal gait, Improper body mechanics  Visit Diagnosis: Pain in right foot     Problem List Patient Active Problem List   Diagnosis Date Noted  . Vitamin D deficiency 11/11/2015  . Preventative health care 11/11/2015  . Osteopenia 11/11/2015  . Mass of left lower leg 09/07/2015  . Adverse drug reaction 06/13/2015  . Overweight (BMI 25.0-29.9) 06/13/2015  . Greater trochanteric bursitis of left hip 06/05/2015  . Bunion of left foot 06/05/2015  . Neuropathy of both feet 06/05/2015  . 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 Thrombocytopenia ([place] 04/05/2015  . Esophageal reflux 03/19/2015  . Need for shingles vaccine  03/14/2015  . Lower back pain 03/10/2015  . Dysphagia 03/10/2015  . Herpes simplex virus infection   . OA (osteoarthritis) of hip   . GERD (gastroesophageal reflux disease)   . Plantar fasciitis   . Depression   . Genital herpes   . Bilateral hearing loss     Fisher,Benjamin PT DPT 03/17/2016, 1:49 PM  [facility] [address] [place] [address]Phone: [phone]  Fax:  [phone] Name: Olivia MADILLMRN: [id number] of Birth: [birth year]

## 2016-03-20 ENCOUNTER — Ambulatory Visit: Payer: 59 | Admitting: Physical Therapy

## 2016-03-20 DIAGNOSIS — M79671 Pain in right foot: Secondary | ICD-10-CM

## 2016-03-21 NOTE — Therapy (Signed)
[place] [facility] [address], [place], [address] Phone: [phone]   Fax:  [phone]  Physical Therapy Treatment  Patient Details  Name: Olivia Morgan MRN: [id number] Date of Birth: 10-Apr-1956 Referring Provider: Daylene Katayama  Encounter Date: 03/20/2016      PT End of Session - 03/20/16 0835    Visit Number 4   Number of Visits 13   Date for PT Re-Evaluation May 14, 2016   Authorization Type g codes   PT Start Time 6962   PT Stop Time 1615   PT Time Calculation (min) 41 min   Activity Tolerance Patient tolerated treatment well   Behavior During Therapy [facility] for tasks assessed/performed      Past Medical History:  Diagnosis Date  . Bilateral hearing loss    wears hearing aids  . Depression   . Genital herpes   . GERD (gastroesophageal reflux disease)   . Herpes simplex virus infection   . Murmur   . Neuropathy of both feet 06/05/2015  . OA (osteoarthritis) of hip    left  . Plantar fasciitis   . Stomatitis     Past Surgical History:  Procedure Laterality Date  . CESAREAN SECTION    . COLONOSCOPY  2010  . FOOT SURGERY Right   . 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 UPPER GI ENDOSCOPY  1992    There were no vitals filed for this visit.      Subjective Assessment - 03/20/16 1538    Subjective Pt is beginning to cramp in R foot when she does her doming exercises.    Pertinent History Pt reports plantarfasciitis for multiple years. It has been aggravated since her R knee replacement. It really started to hurt 02/18/16. She went to see her podiatrist who gave her an injection in her R heel on 02/22/16. Pt was also given an arch support brace without improvement. She has used night splints in the past without improvement. She has a history of L hip replacement with L lateral foot numbness following which has continued. Pt also reports  history of L plantarfasciitis but it is not currently bothering her. Pt reports L low back/hip muscle strain on 03/03/16 which is bothering her at this time.    How long can you walk comfortably? Unlimited time but with increased pain   Diagnostic tests Radiographs, pt denies heel spurs   Patient Stated Goals Decrease pain and improve ability to walk.    Pain Onset More than a month ago                  Objective: Focus of session on reviewing and correcting exercises to minimize cramping, progress doming and glute exercises so pt can continue to progress while away from PT:  Doming, multiple cues needed to performing appropriately but with minimal effort needed.  glute sets performed first B, then unilateral, 3x5 at 100% activation, 3x5 at 50%, 3x5 at 25% activation.  Pt verbalized understanding and demonstrated performance of this.  Progressed gastroc stretch and great toe extension stretch in seated in terms of volume from 3x30 sec. To 3x1 min for each.  Pt verbalized understanding of the need to be active but to monitor for potential to incr. Irritation.                    PT Long Term Goals - 02/04/16  [place] #1   Title Pt will be able to ascend stairs step over step   Baseline able to perform for 3 steps only   Time 6   Period Weeks   Status Partially Met     PT LONG TERM GOAL #2   Title Pt will improve strength to 5/5 in RLE to decr. compensation from back and opposite LE   Baseline 4/5 and pain   Time 6   Period Weeks   Status Not Met     PT LONG TERM GOAL #3   Title Pt will be able to return to dancing with pain no greater than 2/10   Baseline unable to dance   Time 6   Period Weeks   Status Not Met     PT LONG TERM GOAL #4   Title Pt will improve 6MWT from 1000' to 1400' indicating improvement in community level ambulation   Time 6   Period Weeks   Status New               Plan - 03/20/16 2707     Clinical Impression Statement Pt has continued to make progress and may be appropriate for d/c at next session. she is having minimal pain, some cramping in both feet. Encouraged to take two weeks off and then return for followup to ensure she is continuing to do well/has no further needs for PT   Rehab Potential Good   Clinical Impairments Affecting Rehab Potential Positive: improving pain, age, motivation; Negative: chronicity   PT Frequency 2x / week   PT Duration 6 weeks   PT Treatment/Interventions Aquatic Therapy;ADLs/Self Care Home Management;Therapeutic activities;Therapeutic exercise;Neuromuscular re-education;Patient/family education;Dry needling;Passive range of motion;Manual techniques;Cryotherapy;Electrical Stimulation;Iontophoresis 60m/ml Dexamethasone;Moist Heat;Traction;Ultrasound;Gait training;Balance training   PT Next Visit Plan Assess pelvic control, STM to plantarfascia, intrinsic foot strengthening, stretching   PT Home Exercise Plan Rolling foot on frozen water bottle, gastroc step stretches, continue with plantar fasciitis hard plastic shoe inserts, consider night splints   Consulted and Agree with Plan of Care Patient      Patient will benefit from skilled therapeutic intervention in order to improve the following deficits and impairments:  Pain, Difficulty walking, Abnormal gait, Improper body mechanics  Visit Diagnosis: Pain in right foot     Problem List Patient Active Problem List   Diagnosis Date Noted  . Vitamin D deficiency 11/11/2015  . Preventative health care 11/11/2015  . Osteopenia 11/11/2015  . Mass of left lower leg 09/07/2015  . Adverse drug reaction 06/13/2015  . Overweight (BMI 25.0-29.9) 06/13/2015  . Greater trochanteric bursitis of left hip 06/05/2015  . Bunion of left foot 06/05/2015  . Neuropathy of both feet 06/05/2015  . 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 [place]ocyt[place] ([place] 04/05/[place]  . Esophageal reflux  03/19/2015  . Need for shingles vaccine 03/14/2015  . Lower back pain 03/10/2015  . Dysphagia 03/10/2015  . Herpes simplex virus infection   . OA (osteoarthritis) of hip   . GERD (gastroesophageal reflux disease)   . Plantar fasciitis   . Depression   . Genital herpes   . Bilateral hearing loss     Zayan Delvecchio PT DPT 03/21/2016, 8:37 AM  [facility] [address] [place] [address]Phone: [phone]  Fax:  [phone] Name: TPAYSLEY POPLARMRN: [id number] of Birth: 6Jun 29, 1957

## 2016-03-22 ENCOUNTER — Ambulatory Visit: Payer: 59 | Admitting: Physical Therapy

## 2016-03-23 ENCOUNTER — Other Ambulatory Visit: Payer: Self-pay | Admitting: Family Medicine

## 2016-03-27 ENCOUNTER — Encounter: Payer: 59 | Admitting: Physical Therapy

## 2016-03-30 ENCOUNTER — Encounter: Payer: 59 | Admitting: Physical Therapy

## 2016-04-05 ENCOUNTER — Ambulatory Visit (INDEPENDENT_AMBULATORY_CARE_PROVIDER_SITE_OTHER): Payer: 59 | Admitting: Family Medicine

## 2016-04-05 ENCOUNTER — Encounter: Payer: Self-pay | Admitting: Family Medicine

## 2016-04-05 DIAGNOSIS — J3089 Other allergic rhinitis: Secondary | ICD-10-CM | POA: Diagnosis not present

## 2016-04-05 DIAGNOSIS — M722 Plantar fascial fibromatosis: Secondary | ICD-10-CM | POA: Diagnosis not present

## 2016-04-05 MED ORDER — MONTELUKAST SODIUM 10 MG PO TABS: 10.0000 mg | ORAL_TABLET | Freq: Every day | ORAL | 3 refills | Status: DC

## 2016-04-05 MED ORDER — ALBUTEROL SULFATE HFA 108 (90 BASE) MCG/ACT IN AERS: 2.0000 | INHALATION_SPRAY | RESPIRATORY_TRACT | 1 refills | Status: DC | PRN

## 2016-04-05 NOTE — Progress Notes (Signed)
BP 118/82 (BP Location: Right Arm, Patient Position: Sitting, Cuff Size: Large)   Pulse 100   Temp 98.2 F (36.8 C)   Resp 16   Ht 5\' 3"  (1.6 m)   Wt 168 lb 5 oz (76.3 kg)   SpO2 96%   BMI 29.82 kg/m    Subjective:    Patient ID: Olivia Morgan, female    DOB: 12-17-1955, 60 y.o.   MRN: [id number]  HPI: Olivia VANDEZANDE is a 60 y.o. female  Chief Complaint  Patient presents with  . Nasal Congestion  . Cough  . Allergies   She is here today for allergies; she thinks it's allergies, but she's not sure Thought she was coming down with something last week; little bit of sore throat; congestion, taking benadryl; took some different cold tablets; daytime, night-time; normall has spring and fall allergies, [place] grass and ragweed She has been seeing the same guy for a year now, he is a Chartered loss adjuster and he has stuff; that was months ago Then she had knee partial replacement (right); very painful, same doctor; left knee had been fine; the right replacement was terrible though; she got through it anyway She is allergic to down pillows; her boyfriend has down pillows and just being around the pillow made her nose run; she thinks it might be the dust mites, not necessarily the down; woke up coughing in the middle of the night She finally bought him some down alternative pillows Outside cat and dog, feeds them and that's it If she takes a deep breath, she is coughing, something going on; like in the back of the neck, rattling some; not much drainage  She went to the foot doctor and got an injection; never used the silvadene (?); had plantar fasciitis, had PT for that  Depression screen [facility] 2/9 11/11/2015 09/07/2015  Decreased Interest 0 0  Down, Depressed, Hopeless 0 0  PHQ - 2 Score 0 0    Relevant past medical, surgical, family and social history reviewed Past Medical History:  Diagnosis Date  . Bilateral hearing loss    wears hearing aids  . Depression   . Genital herpes   . GERD  (gastroesophageal reflux disease)   . Herpes simplex virus infection   . Murmur   . Neuropathy of both feet 06/05/2015  . OA (osteoarthritis) of hip    left  . Plantar fasciitis   . Stomatitis    Past Surgical History:  Procedure Laterality Date  . CESAREAN SECTION    . COLONOSCOPY  2010  . FOOT SURGERY Right   . 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 TOTAL HIP ARTHROPLASTY Left 10/2008  . UPPER GI ENDOSCOPY  1992   Family History  Problem Relation Age of Onset  . Aneurysm Mother   . Stroke Mother   . Heart disease Father 19  . Hypertension Father   . Lung disease Father   . Heart disease Paternal Grandfather   . Hypertension Brother   . Cancer Neg Hx   . COPD Neg Hx   . Diabetes Neg Hx    Social History  Substance Use Topics  . Smoking status: Never Smoker  . Smokeless tobacco: Never Used  . Alcohol use Yes     Comment: socially   Interim medical history since last visit reviewed. Allergies and medications reviewed  Review of Systems Per HPI unless specifically indicated above     Objective:  BP 118/82 (BP Location: Right Arm, Patient Position: Sitting, Cuff Size: Large)   Pulse 100   Temp 98.2 F (36.8 C)   Resp 16   Ht 5\' 3"  (1.6 m)   Wt 168 lb 5 oz (76.3 kg)   SpO2 96%   BMI 29.82 kg/m   Wt Readings from Last 3 Encounters:  04/05/16 168 lb 5 oz (76.3 kg)  11/11/15 164 lb (74.4 kg)  09/07/15 165 lb (74.8 kg)    Physical Exam  Constitutional: She appears well-developed and well-nourished.  overweight  HENT:  Head: Normocephalic and atraumatic.  Right Ear: Tympanic membrane, external ear and ear canal normal. Decreased hearing is noted.  Left Ear: Tympanic membrane, external ear and ear canal normal. Decreased hearing is noted.  Nose: Rhinorrhea present.  Mouth/Throat: Oropharynx is clear and moist and mucous membranes are normal.  Eyes: Conjunctivae and EOM are normal. Right eye exhibits no hordeolum. Left eye exhibits  no hordeolum. No scleral icterus.  Cardiovascular: Normal rate, regular rhythm, S1 normal, S2 normal and normal heart sounds.   No extrasystoles are present.  Pulmonary/Chest: Effort normal and breath sounds normal. No respiratory distress.  Abdominal: She exhibits no distension.  Musculoskeletal: Normal range of motion. She exhibits no edema.  Lymphadenopathy:    She has no cervical adenopathy.  Neurological: She is alert. She displays no tremor. Gait normal.  Skin: Skin is warm and dry. No bruising and no ecchymosis noted. No cyanosis. No pallor.  Psychiatric: Her speech is normal and behavior is normal. Thought content normal. Her mood appears not anxious. She does not exhibit a depressed mood.    Results for orders placed or performed in visit on 11/11/15  VITAMIN D 25 Hydroxy (Vit-D Deficiency, Fractures)  Result Value Ref Range   Vit D, 25-Hydroxy 57.5 30.0 - 100.0 ng/mL  Vitamin B12  Result Value Ref Range   Vitamin B-12 727 211 - 946 pg/mL  Glucose  Result Value Ref Range   Glucose 91 65 - 99 mg/dL  TSH  Result Value Ref Range   TSH 2.570 0.450 - 4.500 uIU/mL  CBC with Differential/Platelet  Result Value Ref Range   WBC 5.4 3.4 - 10.8 x10E3/uL   RBC 4.61 3.77 - 5.28 x10E6/uL   Hemoglobin 15.7 11.1 - 15.9 g/dL   Hematocrit 16.144.0 09.634.0 - 46.6 %   MCV 95 79 - 97 fL   MCH 34.1 (H) 26.6 - 33.0 pg   MCHC 35.7 31.5 - 35.7 g/dL   RDW 04.513.8 40.912.3 - 81.115.4 %   Platelets 156 150 - 379 x10E3/uL   Neutrophils 72 %   Lymphs 17 %   Monocytes 9 %   Eos 2 %   Basos 0 %   Neutrophils Absolute 3.9 1.4 - 7.0 x10E3/uL   Lymphocytes Absolute 0.9 0.7 - 3.1 x10E3/uL   Monocytes Absolute 0.5 0.1 - 0.9 x10E3/uL   EOS (ABSOLUTE) 0.1 0.0 - 0.4 x10E3/uL   Basophils Absolute 0.0 0.0 - 0.2 x10E3/uL   Immature Granulocytes 0 %   Immature Grans (Abs) 0.0 0.0 - 0.1 x10E3/uL  LP+Creat+Hb A1c  Result Value Ref Range   Hgb A1c MFr Bld 5.1 4.8 - 5.6 %   Creatinine, Ser 0.83 0.57 - 1.00 mg/dL   GFR  calc non Af Amer 76 >59 mL/min/1.73   GFR calc Af Amer 88 >59 mL/min/1.73   Cholesterol, Total 162 100 - 199 mg/dL   Triglycerides 914135 0 - 149 mg/dL   HDL 69 >78>39 mg/dL  VLDL Cholesterol Cal 27 5 - 40 mg/dL   LDL Calculated 66 0 - 99 mg/dL   Chol/HDL Ratio 2.3 0.0 - 4.4 ratio units      Assessment & Plan:   Problem List Items Addressed This Visit    None       Follow up plan: No Follow-up on file.  An after-visit summary was printed and given to the patient at check-out.  Please see the patient instructions which may contain other information and recommendations beyond what is mentioned above in the assessment and plan.  Meds ordered this encounter  Medications  . montelukast (SINGULAIR) 10 MG tablet    Sig: Take 1 tablet (10 mg total) by mouth at bedtime.    Dispense:  30 tablet    Refill:  3  . albuterol (PROVENTIL HFA;VENTOLIN HFA) 108 (90 Base) MCG/ACT inhaler    Sig: Inhale 2 puffs into the lungs every 4 (four) hours as needed for wheezing or shortness of breath.    Dispense:  1 Inhaler    Refill:  1    No orders of the defined types were placed in this encounter.

## 2016-04-05 NOTE — Patient Instructions (Signed)
Start the montelukast and we should note some benefit by day 8 Use the inhaler as needed Avoid any triggers Call me with an update

## 2016-04-06 ENCOUNTER — Ambulatory Visit: Payer: 59 | Admitting: Physical Therapy

## 2016-04-06 DIAGNOSIS — M79671 Pain in right foot: Secondary | ICD-10-CM | POA: Diagnosis not present

## 2016-04-06 NOTE — Therapy (Signed)
[facility] [address] [place], [place], [address] Phone: [phone]   Fax:  [phone]  Physical Therapy Treatment  Patient Details  Name: Olivia Morgan MRN: [id number] Date of Birth: 06/02/1955 Referring Provider: Gala LewandowskyBrent Evans  Encounter Date: 04/06/2016      PT End of Session - 04/06/16 1403    Visit Number 5   Number of Visits 13   Date for PT Re-Evaluation 04/18/16   Authorization Type g codes   PT Start Time 1347   PT Stop Time 1430   PT Time Calculation (min) 43 min   Activity Tolerance Patient tolerated treatment well   Behavior During Therapy [facility] for tasks assessed/performed      Past Medical History:  Diagnosis Date  . Bilateral hearing loss    wears hearing aids  . Depression   . Genital herpes   . GERD (gastroesophageal reflux disease)   . Herpes simplex virus infection   . Murmur   . Neuropathy of both feet 06/05/2015  . OA (osteoarthritis) of hip    left  . Plantar fasciitis   . Stomatitis     Past Surgical History:  Procedure Laterality Date  . CESAREAN SECTION    . COLONOSCOPY  2010  . FOOT SURGERY Right   . REPLAC[facility]13   [facility]  . TOTAL HIP ARTHROPLASTY Left 10/2008  . UPPER GI ENDOSCOPY  1992    There were no vitals filed for this visit.      Subjective Assessment - 04/06/16 1401    Subjective Pt reports R foot is significantly improved. L foot "wants to hurt", and pt also reports R lateral knee pain under patellar region.   Pertinent History Pt reports plantarfasciitis for multiple years. It has been aggravated since her R knee replacement. It really started to hurt 02/18/16. She went to see her podiatrist who gave her an injection in her R heel on 02/22/16. Pt was also given an arch support brace without improvement. She has used night splints in the past without improvement. She has a history of L hip replacement with L lateral  foot numbness following which has continued. Pt also reports history of L plantarfasciitis but it is not currently bothering her. Pt reports L low back/hip muscle strain on 03/03/16 which is bothering her at this time.    How long can you walk comfortably? Unlimited time but with increased pain   Diagnostic tests Radiographs, pt denies heel spurs   Patient Stated Goals Decrease pain and improve ability to walk.    Currently in Pain? No/denies   Pain Onset More than a month ago               Objective: Educated extensively on how to maintain pain free status in RLE, how to manage LLE as well, including ice massage, rolling, stretching and glute strengthening.  Reassessed gait, strength, joint play in R foot, ankle. DF is WNL, Strength is still impaired on R side likely due to R knee UKA. Pt is I with her HEP for this. Stairs are now WNL.                         PT Long Term Goals - 04/06/16 1439      PT LONG TERM GOAL #1   Title Pt will be able to ascend stairs step over step   Baseline able to  perform for 3 steps only   Time 6   Period Weeks   Status Achieved     PT LONG TERM GOAL #2   Title Pt will improve strength to 5/5 in RLE to decr. compensation from back and opposite LE   Baseline 4/5 and pain   Time 6   Period Weeks   Status Achieved     PT LONG TERM GOAL #3   Title Pt will be able to return to dancing with pain no greater than 2/10   Baseline unable to dance   Time 6   Period Weeks   Status Unable to assess     PT LONG TERM GOAL #4   Title Pt will improve 6MWT from 1000' to 1400' indicating improvement in community level ambulation   Time 6   Period Weeks   Status Achieved               Plan - 04/06/16 1433    Clinical Impression Statement Pt is apprporiate for d/c at this time, she has no pain in R foot. Pt does have L foot pain and R knee pain and may be appropriate for pick up and treatment for this in 2018.   Rehab  Potential Good   Clinical Impairments Affecting Rehab Potential Positive: improving pain, age, motivation; Negative: chronicity   PT Frequency 2x / week   PT Duration 6 weeks   PT Treatment/Interventions Aquatic Therapy;ADLs/Self Care Home Management;Therapeutic activities;Therapeutic exercise;Neuromuscular re-education;Patient/family education;Dry needling;Passive range of motion;Manual techniques;Cryotherapy;Electrical Stimulation;Iontophoresis 4mg /ml Dexamethasone;Moist Heat;Traction;Ultrasound;Gait training;Balance training   PT Next Visit Plan Assess pelvic control, STM to plantarfascia, intrinsic foot strengthening, stretching   PT Home Exercise Plan Rolling foot on frozen water bottle, gastroc step stretches, continue with plantar fasciitis hard plastic shoe inserts, consider night splints   Consulted and Agree with Plan of Care Patient      Patient will benefit from skilled therapeutic intervention in order to improve the following deficits and impairments:  Pain, Difficulty walking, Abnormal gait, Improper body mechanics  Visit Diagnosis: Pain in right foot     Problem List Patient Active Problem List   Diagnosis Date Noted  . Vitamin D deficiency 11/11/2015  . Preventative health care 11/11/2015  . Osteopenia 11/11/2015  . Mass of left lower leg 09/07/2015  . Adverse drug reaction 06/13/2015  . Overweight (BMI 25.0-29.9) 06/13/2015  . Greater trochanteric bursitis of left hip 06/05/2015  . Bunion of left foot 06/05/2015  . Neuropathy of both feet 06/05/2015  . 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 Thrombocytopenia (HCC) 04/05/2015  . Esophageal reflux 03/19/2015  . Need for shingles vaccine 03/14/2015  . Lower back pain 03/10/2015  . Dysphagia 03/10/2015  . Herpes simplex virus infection   . OA (osteoarthritis) of hip   . GERD (gastroesophageal reflux disease)   . Plantar fasciitis   . Depression   . Genital herpes   . Bilateral hearing loss      Fisher,Benjamin PT DPT 04/06/2016, 2:41 PM  [facility] [address] [place], [place], [address] Phone: [phone]   Fax:  [phone]  Name: Olivia Morgan MRN: [id number] Date of Birth: Aug 13, 1955

## 2016-04-16 DIAGNOSIS — J309 Allergic rhinitis, unspecified: Secondary | ICD-10-CM | POA: Insufficient documentation

## 2016-04-16 NOTE — Assessment & Plan Note (Signed)
Explained diagnosis; don't think this is bacterial infection; will have her try to avoid triggers; add singulair, which she should start to notice effect after a week;

## 2016-04-16 NOTE — Assessment & Plan Note (Signed)
Managed by podiatrist

## 2016-05-02 ENCOUNTER — Telehealth: Payer: Self-pay | Admitting: Family Medicine

## 2016-05-02 NOTE — Telephone Encounter (Signed)
Requesting return call

## 2016-05-02 NOTE — Telephone Encounter (Signed)
Patient called asking for a return call. Called patient and left a voicemail to give me a call back.

## 2016-05-03 ENCOUNTER — Ambulatory Visit: Payer: 59 | Admitting: Family Medicine

## 2016-05-03 ENCOUNTER — Telehealth: Payer: Self-pay | Admitting: Family Medicine

## 2016-05-03 ENCOUNTER — Telehealth: Payer: Self-pay

## 2016-05-03 MED ORDER — BENZONATATE 100 MG PO CAPS: 100.0000 mg | ORAL_CAPSULE | Freq: Three times a day (TID) | ORAL | 0 refills | Status: DC | PRN

## 2016-05-03 NOTE — Telephone Encounter (Signed)
I spoke with patient; symptoms started Thursday; coughing, thought it was down pillow; had the inhaler, used it, took allergy medicine, felt fine then; by Sunday, she had cough, sounded wheezy; went to drug store, cold OTC med; fever at night 99 to 100; taking Delsym at night; no energy; sleeping a lot; aching; went into work half a day yesterday Flu is so rampant Work note okay if needed; she doesn't think she needs one Too late for Tamiflu Rx for tessalon perles sent to pharmacy She'll stay at home and rest; call if worse, or come in if needed Plain mucinex, green tea, vitamin C

## 2016-05-03 NOTE — Telephone Encounter (Signed)
Excellent note; I have nothing to add Thank you

## 2016-05-03 NOTE — Telephone Encounter (Signed)
Patient called wanting to know how long does she have to be away from people since she has had flu sx. I asked if she had a fever and she said that the last 3 days, she has been running a low grade fever.  I then asked if she has been taking any medications such as Tylenol, Ibuprofen etc. She stated that she has been taking some cold medicine with Acetaminophen in it.   I informed her that I have been taught/ told that patients must be fever free for 24 hrs. without any medication before they should be allowed around other people and   since she has been taking cold medicine, we really could not tell if she has been fever free.   I then encouraged her to be cautious when around others and to take proper flu precautions: good hand hygiene, wearing a mask, sneezing/ coughing into a tissue, etc.  Please advise or reach out to patient if you feel more information should be given or that she should be seen.  Thanks

## 2016-06-12 ENCOUNTER — Encounter: Payer: Self-pay | Admitting: Podiatry

## 2016-06-12 ENCOUNTER — Ambulatory Visit (INDEPENDENT_AMBULATORY_CARE_PROVIDER_SITE_OTHER): Payer: 59 | Admitting: Podiatry

## 2016-06-12 DIAGNOSIS — G629 Polyneuropathy, unspecified: Secondary | ICD-10-CM | POA: Diagnosis not present

## 2016-06-12 DIAGNOSIS — T304 Corrosion of unspecified body region, unspecified degree: Secondary | ICD-10-CM
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
## 2016-07-25 ENCOUNTER — Other Ambulatory Visit: Payer: Self-pay | Admitting: Family Medicine

## 2016-08-11 ENCOUNTER — Telehealth: Payer: Self-pay | Admitting: Family Medicine

## 2016-08-11 ENCOUNTER — Other Ambulatory Visit: Payer: Self-pay

## 2016-08-11 DIAGNOSIS — Z1239 Encounter for other screening for malignant neoplasm of breast: Secondary | ICD-10-CM

## 2016-08-11 NOTE — Telephone Encounter (Signed)
Patient called wanting to get an order to have her mammogram this month.  [facility] and the results are in the chart.  Patient is scheduled to have her next CPE with Dr. Sherie DonLada on 11/08/16 @ 11am.  Please call once order has been placed so that she call [place] to schedule appointment.

## 2016-08-11 NOTE — Telephone Encounter (Signed)
Ordered and patient notified.  

## 2016-08-26 IMAGING — US US EXTREM LOW*L* LIMITED
1 series · 13 of 25 positions shown · non-contrast
Comparison: None.

CLINICAL DATA: Palpable abnormality over the anterior left lower
leg medial to midline. Evaluate for liposarcoma.

EXAM:
ULTRASOUND left LOWER EXTREMITY LIMITED
TECHNIQUE: Ultrasound examination of the lower extremity soft tissues was
performed in the area of clinical concern.

[Series 1: us extrem low*left* limited · 0.05mm/px · 25 acquisitions, 13 frames shown]
[im 1/25]
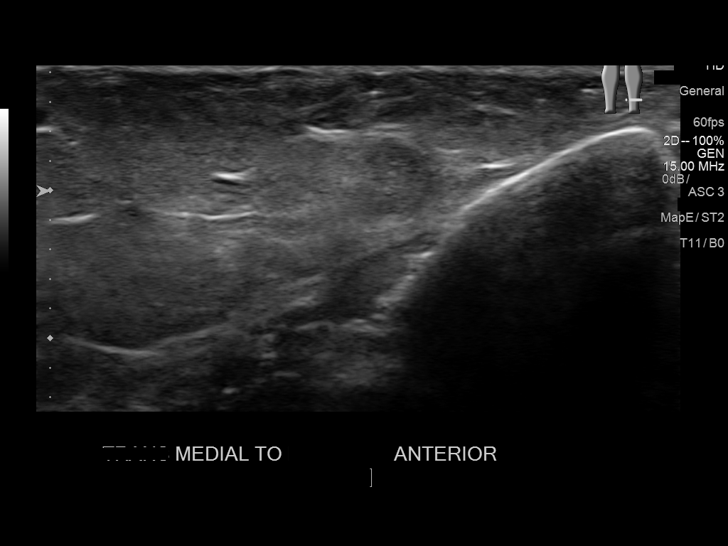
[im 3/25]
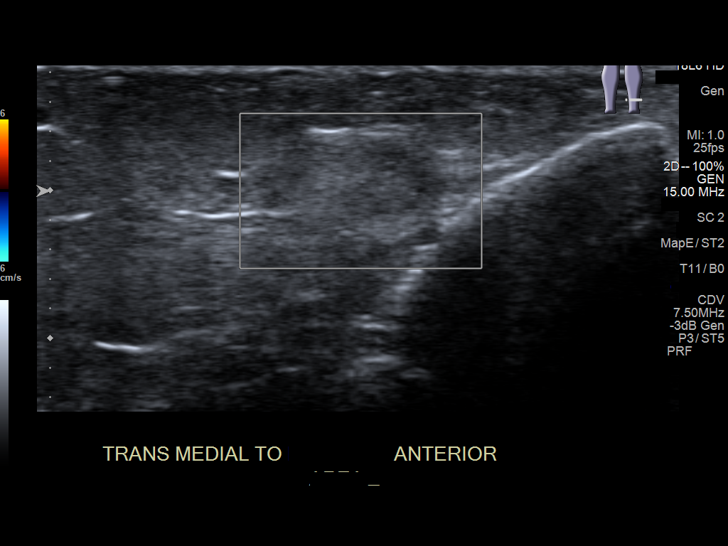
[im 5/25]
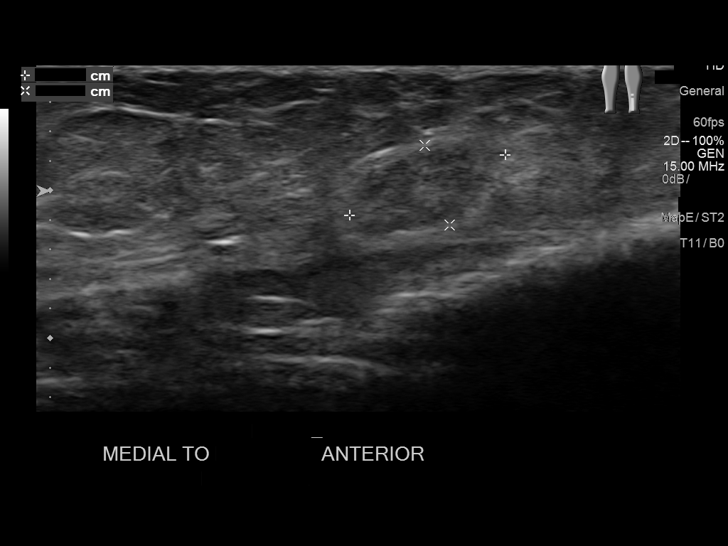
[im 7/25]
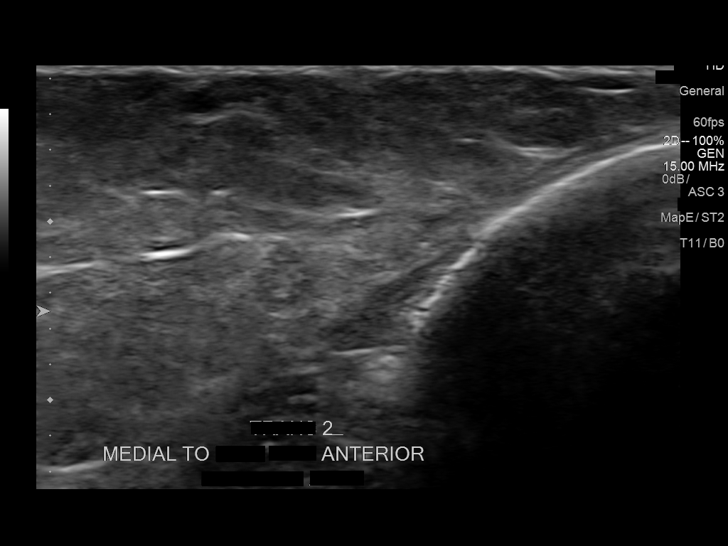
[im 9/25]
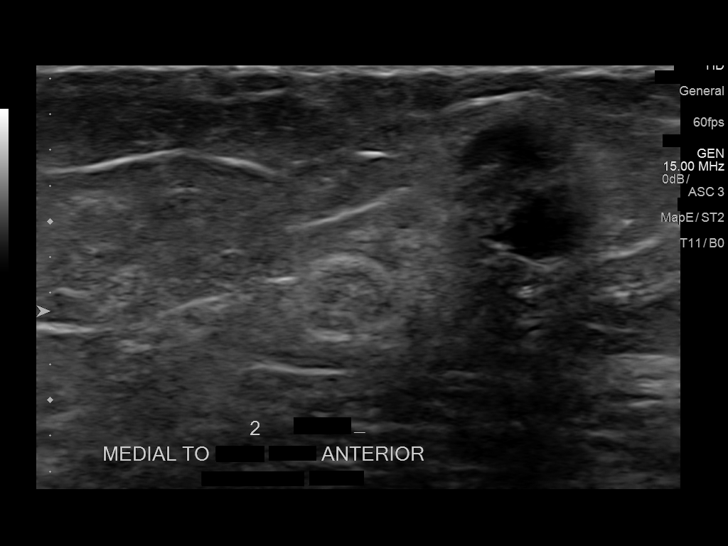
[im 11/25]
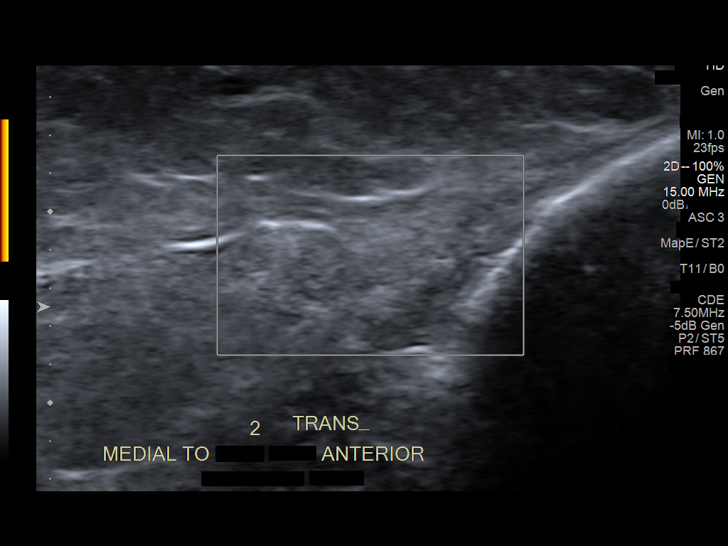
[im 13/25]
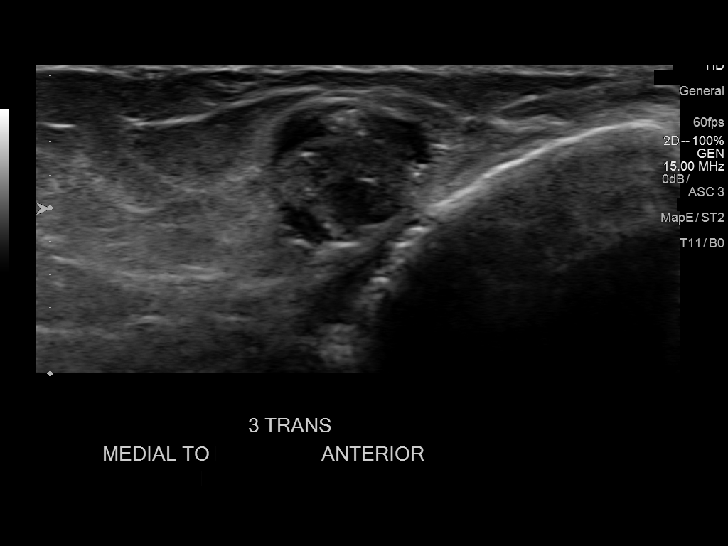
[im 15/25]
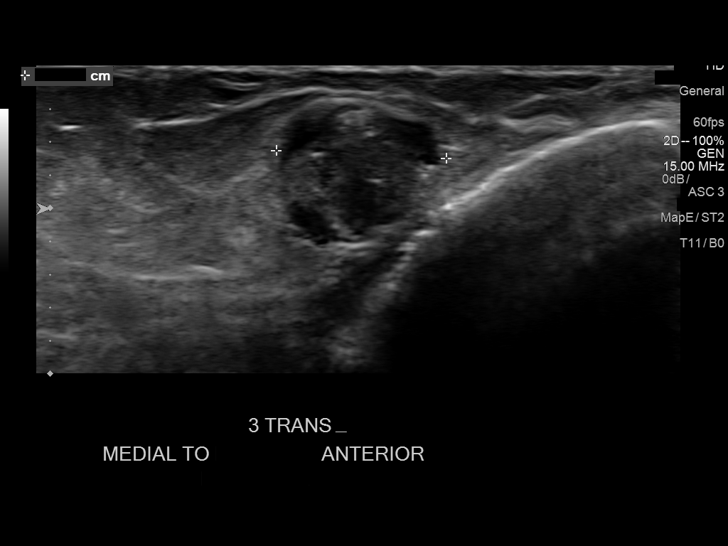
[im 17/25]
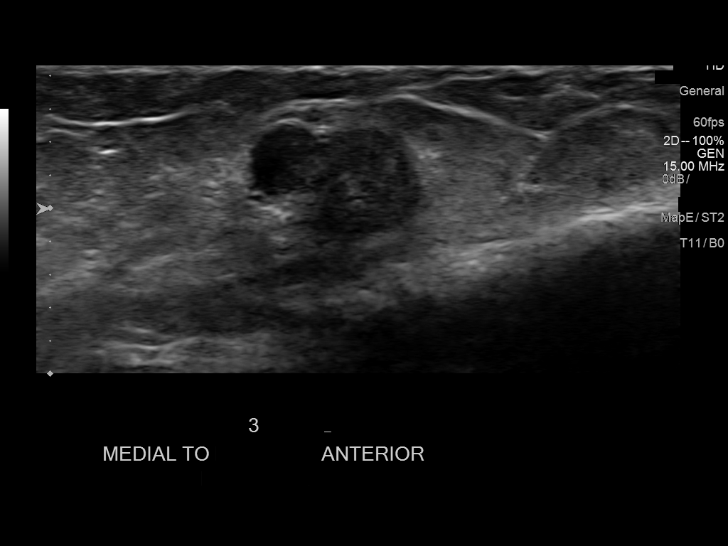
[im 19/25]
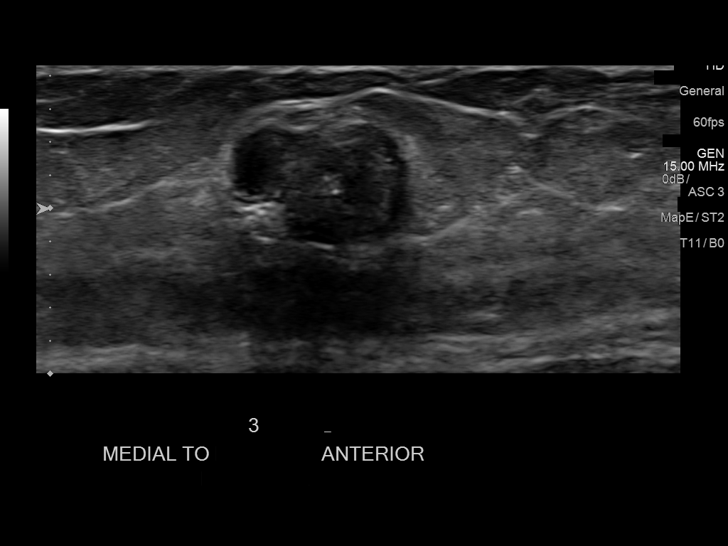
[im 21/25]
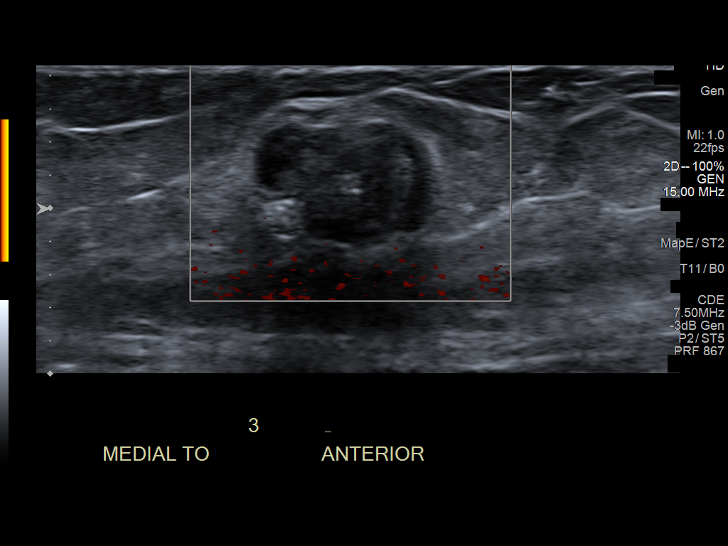
[im 23/25]
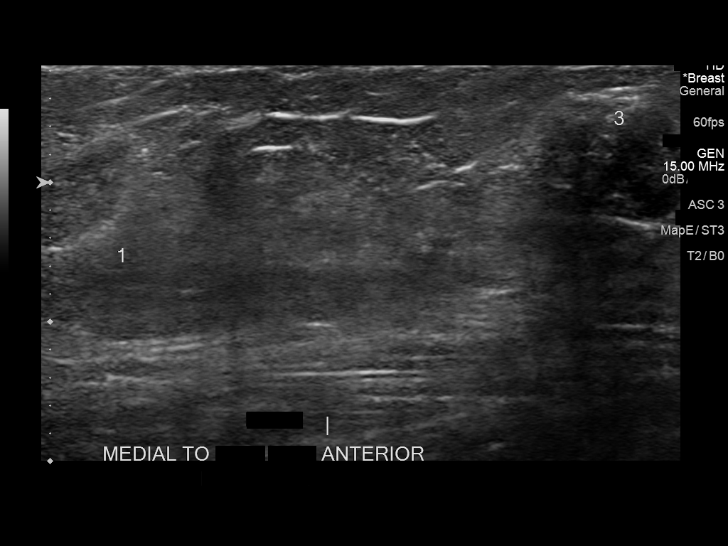
[im 25/25]
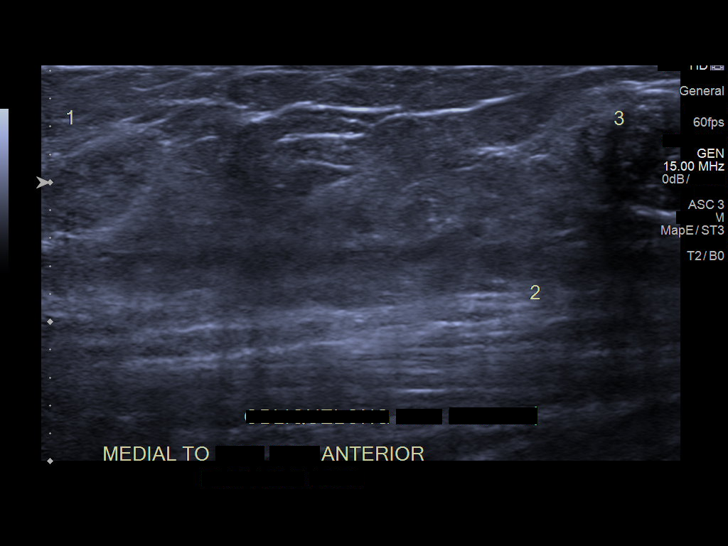

[13 of 25 positions shown; findings below may reference images not displayed]

FINDINGS: The anterior medial left lower leg was scanned over patient's
palpable abnormality demonstrating an oval fairly well-defined mass
within the subcutaneous fat which is slightly hypoechoic to the
adjacent fat and measures 0.5 x 0.6 x 1.1 cm. Just below this region
is a round isoechoic well-defined mass within the subcutaneous fat
measuring 4 x 5 x 6 mm. Adjacent to this smaller mass is a third
oval well-defined heterogeneous hypoechoic mass within the
subcutaneous fat just superficial to the tibia measuring 0.8 x 1.0 x
1.1 cm. These masses are all within 3 cm of each other in
demonstrate no internal vascularity. These masses demonstrate no
relationship to adjacent vessels.
IMPRESSION: Three adjacent nonspecific solid masses within the subcutaneous fat
of the anterior medial left lower leg with the largest measuring
x 1 x 1.1 cm. No associated with adjacent vessels or underlying
bone. MRI would be helpful for further characterization.

## 2016-09-20 ENCOUNTER — Ambulatory Visit
Admission: RE | Admit: 2016-09-20 | Discharge: 2016-09-20 | Disposition: A | Discharge status: Home / Self Care | Payer: 59 | Source: Ambulatory Visit | Attending: Family Medicine | Admitting: Family Medicine

## 2016-09-20 DIAGNOSIS — Z1231 Encounter for screening mammogram for malignant neoplasm of breast: Secondary | ICD-10-CM | POA: Insufficient documentation

## 2016-09-20 DIAGNOSIS — Z1239 Encounter for other screening for malignant neoplasm of breast: Secondary | ICD-10-CM

## 2016-09-20 LAB — HM MAMMOGRAPHY

## 2016-09-26 ENCOUNTER — Encounter: Payer: Self-pay | Admitting: Podiatry

## 2016-09-26 ENCOUNTER — Ambulatory Visit (INDEPENDENT_AMBULATORY_CARE_PROVIDER_SITE_OTHER): Payer: 59

## 2016-09-26 ENCOUNTER — Ambulatory Visit (INDEPENDENT_AMBULATORY_CARE_PROVIDER_SITE_OTHER): Payer: 59 | Admitting: Podiatry

## 2016-09-26 DIAGNOSIS — M722 Plantar fascial fibromatosis: Secondary | ICD-10-CM

## 2016-09-26 DIAGNOSIS — M76822 Posterior tibial tendinitis, left leg: Secondary | ICD-10-CM | POA: Diagnosis not present

## 2016-09-30 NOTE — Progress Notes (Signed)
   HPI: Patient is a 61 year old female presenting today with a complaint of pain to the medial side of the left ankle and foot that began approximately 2 months ago. She states the pain radiates to her left calf. She reports feeling a nodule in the area. She has taken Tylenol and Mobic with no significant relief of the pain. Walking increases the pain. She denies alleviating factors. She is here for further evaluation and treatment.    Physical Exam: General: The patient is alert and oriented x3 in no acute distress.  Dermatology: Skin is warm, dry and supple bilateral lower extremities. Negative for open lesions or macerations.  Vascular: Palpable pedal pulses bilaterally. No edema or erythema noted. Capillary refill within normal limits.  Neurological: Epicritic and protective threshold grossly intact bilaterally.   Musculoskeletal Exam: Medial longitudinal arch collapse noted to the left foot with weightbearing. Range of motion within normal limits to all pedal and ankle joints bilateral. Muscle strength 5/5 in all groups bilateral.   Radiographic Exam:  Normal osseous mineralization. Joint spaces preserved. No fracture/dislocation/boney destruction.    Assessment: 1. PT tendinitis left 2. Post tibial tendon dysfunction left   Plan of Care:  1. Patient was evaluated. X-rays reviewed. 2. Injection of 0.5 mLs Celestone Soluspan injected into the insertion of the PT tendon of the LLE. Care was taken to avoid direct injection into the tendon. 3. Ankle brace with arch support dispensed. 4. Continue taking Mobic. 5. Return to clinic in 4 weeks.    Felecia ShellingBrent M. Evans, DPM [facility]  Dr. Felecia ShellingBrent M. Evans, DPM    [address]                                        [place], [place] [address]                Office [phone]) [phone]  Fax [phone]

## 2016-10-02 MED ORDER — BETAMETHASONE SOD PHOS & ACET 6 (3-3) MG/ML IJ SUSP: 3.0000 mg | Freq: Once | INTRAMUSCULAR | Status: DC

## 2016-10-03 ENCOUNTER — Ambulatory Visit: Payer: 59 | Admitting: Podiatry

## 2016-10-03 ENCOUNTER — Other Ambulatory Visit: Payer: Self-pay | Admitting: Family Medicine

## 2016-10-24 ENCOUNTER — Ambulatory Visit (INDEPENDENT_AMBULATORY_CARE_PROVIDER_SITE_OTHER): Payer: 59 | Admitting: Podiatry

## 2016-10-24 DIAGNOSIS — M76822 Posterior tibial tendinitis, left leg: Secondary | ICD-10-CM

## 2016-10-24 MED ORDER — BETAMETHASONE SOD PHOS & ACET 6 (3-3) MG/ML IJ SUSP: 3.0000 mg | Freq: Once | INTRAMUSCULAR | Status: DC

## 2016-10-24 NOTE — Progress Notes (Signed)
   HPI: Patient presents today for follow-up treatment and evaluation of posterior tibial tendinitis to the left lower extremity. Patient states that she's doing much better but she does have tenderness to the top of her foot. She believes the ankle brace is helping significantly.   Physical Exam: General: The patient is alert and oriented x3 in no acute distress.  Dermatology: Skin is warm, dry and supple bilateral lower extremities. Negative for open lesions or macerations.  Vascular: Palpable pedal pulses bilaterally. No edema or erythema noted. Capillary refill within normal limits.  Neurological: Epicritic and protective threshold grossly intact bilaterally.   Musculoskeletal Exam: There continues to be some tenderness to palpation along the posterior tibial tendon of the left lower extremity. Paresthesia noted with light touch to the top of the left foot likely due to ankle brace. Range of motion within normal limits to all pedal and ankle joints bilateral. Muscle strength 5/5 in all groups bilateral.   Assessment: 1. Posterior tibial tendinitis left lower extremity   Plan of Care:  1. Patient was evaluated. 2. Injection of 0.5 mL Celestone Soluspan injected in the patient's posterior tibial tendon sheath. Care was taken to avoid direct injection into the tendon. 3. Continue wearing the ankle brace with good shoe gear 4. Return to clinic when necessary   Felecia ShellingBrent M. Rowin Bayron, DPM [facility]  Dr. Felecia ShellingBrent M. Jayquon Theiler, DPM    [address]                                        [place], [place] [address]                Office [phone]  Fax [phone]

## 2016-10-27 ENCOUNTER — Other Ambulatory Visit: Payer: Self-pay

## 2016-10-27 MED ORDER — VALACYCLOVIR HCL 500 MG PO TABS: 500.0000 mg | ORAL_TABLET | Freq: Every day | ORAL | 3 refills | Status: DC

## 2016-10-27 NOTE — Telephone Encounter (Signed)
Patient needs refill sent to mail order also a month supply to a local pharmacy [facility]

## 2016-11-08 ENCOUNTER — Encounter: Payer: Self-pay | Admitting: Family Medicine

## 2016-11-08 ENCOUNTER — Ambulatory Visit (INDEPENDENT_AMBULATORY_CARE_PROVIDER_SITE_OTHER): Payer: 59 | Admitting: Family Medicine

## 2016-11-08 VITALS — BP 122/80 | HR 65 | Temp 97.9°F | Resp 14 | Ht 63.75 in | Wt 172.6 lb

## 2016-11-08 DIAGNOSIS — Z0000 Encounter for general adult medical examination without abnormal findings: Secondary | ICD-10-CM | POA: Diagnosis not present

## 2016-11-08 DIAGNOSIS — E663 Overweight: Secondary | ICD-10-CM | POA: Diagnosis not present

## 2016-11-08 DIAGNOSIS — Z124 Encounter for screening for malignant neoplasm of cervix: Secondary | ICD-10-CM

## 2016-11-08 DIAGNOSIS — R635 Abnormal weight gain: Secondary | ICD-10-CM

## 2016-11-08 NOTE — Progress Notes (Signed)
Patient ID: Olivia Morgan, female   DOB: 1955-06-27, 61 y.o.   MRN: [id number]   Subjective:   Olivia Morgan is a 61 y.o. female here for a complete physical exam  Interim issues since last visit: left arch is falling; supposed to wearing braces, usually wears; seeing podiatrist Keeping her from being as active as she wants to be  USPSTF grade A and B recommendations Depression:  Depression screen [facility] 2/9 11/08/2016 11/11/2015 09/07/2015  Decreased Interest 0 0 0  Down, Depressed, Hopeless 1 0 0  PHQ - 2 Score 1 0 0  staying on wellbutrin 150 mg daily, sometimes 300 mg daily  Hypertension: BP Readings from Last 3 Encounters:  11/08/16 122/80  04/05/16 118/82  11/11/15 124/82   Obesity: Wt Readings from Last 3 Encounters:  11/08/16 172 lb 9.6 oz (78.3 kg)  04/05/16 168 lb 5 oz (76.3 kg)  11/11/15 164 lb (74.4 kg)   BMI Readings from Last 3 Encounters:  11/08/16 29.86 kg/m  04/05/16 29.82 kg/m  11/11/15 28.78 kg/m    Alcohol: social drinker, less than 7 Tobacco use: never HIV, hep B, hep C: not interested STD testing and prevention (chl/gon/syphilis): not interested Intimate partner violence: no abuse Breast cancer: no lumps, not regular checking BRCA gene screening: no breast cancer in fam; mother had a polyp, complications and hysterectomy Cervical cancer screening: remote abnormal Osteoporosis: no steroids for long time Fall prevention/vitamin D: taking vit D Lipids:  Lab Results  Component Value Date   CHOL 162 11/11/2015   Lab Results  Component Value Date   HDL 69 11/11/2015   Lab Results  Component Value Date   LDLCALC 66 11/11/2015   Lab Results  Component Value Date   TRIG 135 11/11/2015   Lab Results  Component Value Date   CHOLHDL 2.3 11/11/2015   No results found for: LDLDIRECT Glucose:  Glucose  Date Value Ref Range Status  11/11/2015 91 65 - 99 mg/dL Final  04/14/2015 85 65 - 99 mg/dL Final   Colorectal cancer: 2010; come back  in 2020 Lung cancer:  Never smoker AAA: no screening yet; start at age 61; mother died at age 66 Aspirin: no aspirin; sick on stomach though; has taken aspirin after knee surgeries and has done fine; mother had aneurysms, stroked while under surgery for her aneurysm; did not wake up for several days Diet: abundance of cheese, most meals, no milk; not enough fruits and veggies Exercise: limited by current foot condition Skin cancer: she has been to [facility], Dr. Evorn Gong; did biopsy   Past Medical History:  Diagnosis Date  . Bilateral hearing loss    wears hearing aids  . Depression   . Genital herpes   . GERD (gastroesophageal reflux disease)   . Herpes simplex virus infection   . Murmur   . Neuropathy of both feet 06/05/2015  . OA (osteoarthritis) of hip    left  . Plantar fasciitis   . Stomatitis    Past Surgical History:  Procedure Laterality Date  . CESAREAN SECTION    . COLONOSCOPY  2010  . FOOT SURGERY Right   . 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 UPPER GI ENDOSCOPY  1992   Family History  Problem Relation Age of Onset  . Aneurysm Mother   . Stroke Mother   . Heart disease Mother   . Heart disease Father 69  . Hypertension  Father   . Lung disease Father   . Heart disease Paternal Grandfather   . Panic disorder Brother   . Milk intolerance Daughter   . Polycystic ovary syndrome Daughter   . Obesity Brother   . Hyperlipidemia Brother   . Hypertension Brother   . Diabetes Brother   . Cancer Neg Hx   . COPD Neg Hx   . Breast cancer Neg Hx    Social History  Substance Use Topics  . Smoking status: Never Smoker  . Smokeless tobacco: Never Used  . Alcohol use Yes     Comment: socially   Review of Systems  Objective:   Vitals:   11/08/16 1114  BP: 122/80  Pulse: 65  Resp: 14  Temp: 97.9 F (36.6 C)  TempSrc: Oral  SpO2: 93%  Weight: 172 lb 9.6 oz (78.3 kg)  Height: 5' 3.75"  (1.619 m)   Body mass index is 29.86 kg/m. Wt Readings from Last 3 Encounters:  11/08/16 172 lb 9.6 oz (78.3 kg)  04/05/16 168 lb 5 oz (76.3 kg)  11/11/15 164 lb (74.4 kg)   Physical Exam  Constitutional: She appears well-developed and well-nourished.  HENT:  Head: Normocephalic and atraumatic.  Eyes: Conjunctivae and EOM are normal. Right eye exhibits no hordeolum. Left eye exhibits no hordeolum. No scleral icterus.  Neck: Carotid bruit is not present. No thyromegaly present.  Cardiovascular: Normal rate, regular rhythm, S1 normal, S2 normal and normal heart sounds.   No extrasystoles are present.  Pulmonary/Chest: Effort normal and breath sounds normal. No respiratory distress. Right breast exhibits no inverted nipple, no mass, no nipple discharge, no skin change and no tenderness. Left breast exhibits no inverted nipple, no mass, no nipple discharge, no skin change and no tenderness. Breasts are symmetrical.  Abdominal: Soft. Normal appearance and bowel sounds are normal. She exhibits no distension, no abdominal bruit, no pulsatile midline mass and no mass. There is no hepatosplenomegaly. There is no tenderness. No hernia.  Genitourinary: Uterus normal. Pelvic exam was performed with patient prone. There is no rash or lesion on the right labia. There is no rash or lesion on the left labia. Cervix exhibits no motion tenderness. Right adnexum displays no mass, no tenderness and no fullness. Left adnexum displays no mass, no tenderness and no fullness.  Musculoskeletal: Normal range of motion. She exhibits no edema.  Lymphadenopathy:       Head (right side): No submandibular adenopathy present.       Head (left side): No submandibular adenopathy present.    She has no cervical adenopathy.    She has no axillary adenopathy.  Neurological: She is alert. She displays no tremor. No cranial nerve deficit. She exhibits normal muscle tone. Gait normal.  Skin: Skin is warm and dry. No bruising and  no ecchymosis noted. No cyanosis. No pallor.     Brown keratotic lesion in hairline right side  Psychiatric: Her speech is normal and behavior is normal. Thought content normal. Her mood appears not anxious. She does not exhibit a depressed mood.    Assessment/Plan:   Problem List Items Addressed This Visit      Other   Overweight (BMI 25.0-29.9)   Relevant Orders   Hemoglobin A1c   TSH   Preventative health care    USPSTF grade A and B recommendations reviewed with patient; age-appropriate recommendations, preventive care, screening tests, etc discussed and encouraged; healthy living encouraged; see AVS for patient education given to patient  Relevant Orders   Comprehensive metabolic panel   CBC with Differential/Platelet   Hemoglobin A1c   Lipid panel   TSH    Other Visit Diagnoses    Cervical cancer screening    -  Primary   Relevant Orders   Pap IG and HPV (high risk) DNA detection   Pap IG and HPV (high risk) DNA detection   Abnormal weight gain       Relevant Orders   TSH       Meds ordered this encounter  Medications  . aspirin EC 81 MG tablet    Sig: Take 1 tablet (81 mg total) by mouth daily. Take one hour BEFORE any meloxicam or NSAID   Orders Placed This Encounter  Procedures  . Comprehensive metabolic panel  . CBC with Differential/Platelet  . Hemoglobin A1c  . Lipid panel  . TSH    Follow up plan: Return in about 1 year (around 11/08/2017) for complete physical.  An After Visit Summary was printed and given to the patient.

## 2016-11-08 NOTE — Patient Instructions (Addendum)
** Please request biopsy results from dermatologist ** Do follow-up with a dermatologist about the spots on your face Get a scan to look for an aneurysm as soon as you turn 61; call me before then with any symptoms Please have labs done soon If you have not heard anything from my staff in a week about any orders/referrals/studies from today, please contact us here to follow-up [phone] Health Maintenance, Female Adopting a healthy lifestyle and getting preventive care can go a long way to promote health and wellness. Talk with your health care provider about what schedule of regular examinations is right for you. This is a good chance for you to check in with your provider about disease prevention and staying healthy. In between checkups, there are plenty of things you can do on your own. Experts have done a lot of research about which lifestyle changes and preventive measures are most likely to keep you healthy. Ask your health care provider for more information. Weight and diet Eat a healthy diet  Be sure to include plenty of vegetables, fruits, low-fat dairy products, and lean protein.  Do not eat a lot of foods high in solid fats, added sugars, or salt.  Get regular exercise. This is one of the most important things you can do for your health. ? Most adults should exercise for at least 150 minutes each week. The exercise should increase your heart rate and make you sweat (moderate-intensity exercise). ? Most adults should also do strengthening exercises at least twice a week. This is in addition to the moderate-intensity exercise.  Maintain a healthy weight  Body mass index (BMI) is a measurement that can be used to identify possible weight problems. It estimates body fat based on height and weight. Your health care provider can help determine your BMI and help you achieve or maintain a healthy weight.  For females 80 years of age and older: ? A BMI below 18.5 is considered  underweight. ? A BMI of 18.5 to 24.9 is normal. ? A BMI of 25 to 29.9 is considered overweight. ? A BMI of 30 and above is considered obese.  Watch levels of cholesterol and blood lipids  You should start having your blood tested for lipids and cholesterol at 62 years of age, then have this test every 5 years.  You may need to have your cholesterol levels checked more often if: ? Your lipid or cholesterol levels are high. ? You are older than 62 years of age. ? You are at high risk for heart disease.  Cancer screening Lung Cancer  Lung cancer screening is recommended for adults 19-62 years old who are at high risk for lung cancer because of a history of smoking.  A yearly low-dose CT scan of the lungs is recommended for people who: ? Currently smoke. ? Have quit within the past 15 years. ? Have at least a 30-pack-year history of smoking. A pack year is smoking an average of one pack of cigarettes a day for 1 year.  Yearly screening should continue until it has been 15 years since you quit.  Yearly screening should stop if you develop a health problem that would prevent you from having lung cancer treatment.  Breast Cancer  Practice breast self-awareness. This means understanding how your breasts normally appear and feel.  It also means doing regular breast self-exams. Let your health care provider know about any changes, no matter how small.  If you are in your 20s or 30s,  you should have a clinical breast exam (CBE) by a health care provider every 1-3 years as part of a regular health exam.  If you are 40 or older, have a CBE every year. Also consider having a breast X-ray (mammogram) every year.  If you have a family history of breast cancer, talk to your health care provider about genetic screening.  If you are at high risk for breast cancer, talk to your health care provider about having an MRI and a mammogram every year.  Breast cancer gene (BRCA) assessment is  recommended for women who have family members with BRCA-related cancers. BRCA-related cancers include: ? Breast. ? Ovarian. ? Tubal. ? Peritoneal cancers.  Results of the assessment will determine the need for genetic counseling and BRCA1 and BRCA2 testing.  Cervical Cancer Your health care provider may recommend that you be screened regularly for cancer of the pelvic organs (ovaries, uterus, and vagina). This screening involves a pelvic examination, including checking for microscopic changes to the surface of your cervix (Pap test). You may be encouraged to have this screening done every 3 years, beginning at age 21.  For women ages 30-65, health care providers may recommend pelvic exams and Pap testing every 3 years, or they may recommend the Pap and pelvic exam, combined with testing for human papilloma virus (HPV), every 5 years. Some types of HPV increase your risk of cervical cancer. Testing for HPV may also be done on women of any age with unclear Pap test results.  Other health care providers may not recommend any screening for nonpregnant women who are considered low risk for pelvic cancer and who do not have symptoms. Ask your health care provider if a screening pelvic exam is right for you.  If you have had past treatment for cervical cancer or a condition that could lead to cancer, you need Pap tests and screening for cancer for at least 20 years after your treatment. If Pap tests have been discontinued, your risk factors (such as having a new sexual partner) need to be reassessed to determine if screening should resume. Some women have medical problems that increase the chance of getting cervical cancer. In these cases, your health care provider may recommend more frequent screening and Pap tests.  Colorectal Cancer  This type of cancer can be detected and often prevented.  Routine colorectal cancer screening usually begins at 62 years of age and continues through 62 years of  age.  Your health care provider may recommend screening at an earlier age if you have risk factors for colon cancer.  Your health care provider may also recommend using home test kits to check for hidden blood in the stool.  A small camera at the end of a tube can be used to examine your colon directly (sigmoidoscopy or colonoscopy). This is done to check for the earliest forms of colorectal cancer.  Routine screening usually begins at age 50.  Direct examination of the colon should be repeated every 5-10 years through 62 years of age. However, you may need to be screened more often if early forms of precancerous polyps or small growths are found.  Skin Cancer  Check your skin from head to toe regularly.  Tell your health care provider about any new moles or changes in moles, especially if there is a change in a mole's shape or color.  Also tell your health care provider if you have a mole that is larger than the size of a pencil eraser.    Always use sunscreen. Apply sunscreen liberally and repeatedly throughout the day.  Protect yourself by wearing long sleeves, pants, a wide-brimmed hat, and sunglasses whenever you are outside.  Heart disease, diabetes, and high blood pressure  High blood pressure causes heart disease and increases the risk of stroke. High blood pressure is more likely to develop in: ? People who have blood pressure in the high end of the normal range (130-139/85-89 mm Hg). ? People who are overweight or obese. ? People who are African American.  If you are 18-39 years of age, have your blood pressure checked every 3-5 years. If you are 40 years of age or older, have your blood pressure checked every year. You should have your blood pressure measured twice-once when you are at a hospital or clinic, and once when you are not at a hospital or clinic. Record the average of the two measurements. To check your blood pressure when you are not at a hospital or clinic, you  can use: ? An automated blood pressure machine at a pharmacy. ? A home blood pressure monitor.  If you are between 55 years and 79 years old, ask your health care provider if you should take aspirin to prevent strokes.  Have regular diabetes screenings. This involves taking a blood sample to check your fasting blood sugar level. ? If you are at a normal weight and have a low risk for diabetes, have this test once every three years after 62 years of age. ? If you are overweight and have a high risk for diabetes, consider being tested at a younger age or more often. Preventing infection Hepatitis B  If you have a higher risk for hepatitis B, you should be screened for this virus. You are considered at high risk for hepatitis B if: ? You were born in a country where hepatitis B is common. Ask your health care provider which countries are considered high risk. ? Your parents were born in a high-risk country, and you have not been immunized against hepatitis B (hepatitis B vaccine). ? You have HIV or AIDS. ? You use needles to inject street drugs. ? You live with someone who has hepatitis B. ? You have had sex with someone who has hepatitis B. ? You get hemodialysis treatment. ? You take certain medicines for conditions, including cancer, organ transplantation, and autoimmune conditions.  Hepatitis C  Blood testing is recommended for: ? Everyone born from 1945 through 1965. ? Anyone with known risk factors for hepatitis C.  Sexually transmitted infections (STIs)  You should be screened for sexually transmitted infections (STIs) including gonorrhea and chlamydia if: ? You are sexually active and are younger than 62 years of age. ? You are older than 62 years of age and your health care provider tells you that you are at risk for this type of infection. ? Your sexual activity has changed since you were last screened and you are at an increased risk for chlamydia or gonorrhea. Ask your  health care provider if you are at risk.  If you do not have HIV, but are at risk, it may be recommended that you take a prescription medicine daily to prevent HIV infection. This is called pre-exposure prophylaxis (PrEP). You are considered at risk if: ? You are sexually active and do not regularly use condoms or know the HIV status of your partner(s). ? You take drugs by injection. ? You are sexually active with a partner who has HIV.  Talk with   your health care provider about whether you are at high risk of being infected with HIV. If you choose to begin PrEP, you should first be tested for HIV. You should then be tested every 3 months for as long as you are taking PrEP. Pregnancy  If you are premenopausal and you may become pregnant, ask your health care provider about preconception counseling.  If you may become pregnant, take 400 to 800 micrograms (mcg) of folic acid every day.  If you want to prevent pregnancy, talk to your health care provider about birth control (contraception). Osteoporosis and menopause  Osteoporosis is a disease in which the bones lose minerals and strength with aging. This can result in serious bone fractures. Your risk for osteoporosis can be identified using a bone density scan.  If you are 15 years of age or older, or if you are at risk for osteoporosis and fractures, ask your health care provider if you should be screened.  Ask your health care provider whether you should take a calcium or vitamin D supplement to lower your risk for osteoporosis.  Menopause may have certain physical symptoms and risks.  Hormone replacement therapy may reduce some of these symptoms and risks. Talk to your health care provider about whether hormone replacement therapy is right for you. Follow these instructions at home:  Schedule regular health, dental, and eye exams.  Stay current with your immunizations.  Do not use any tobacco products including cigarettes, chewing  tobacco, or electronic cigarettes.  If you are pregnant, do not drink alcohol.  If you are breastfeeding, limit how much and how often you drink alcohol.  Limit alcohol intake to no more than 1 drink per day for nonpregnant women. One drink equals 12 ounces of beer, 5 ounces of wine, or 1 ounces of hard liquor.  Do not use street drugs.  Do not share needles.  Ask your health care provider for help if you need support or information about quitting drugs.  Tell your health care provider if you often feel depressed.  Tell your health care provider if you have ever been abused or do not feel safe at home. This information is not intended to replace advice given to you by your health care provider. Make sure you discuss any questions you have with your health care provider. Document Released: 10/10/2010 Document Revised: 09/02/2015 Document Reviewed: 12/29/2014 Elsevier Interactive Patient Education  [employer].

## 2016-11-08 NOTE — Assessment & Plan Note (Signed)
USPSTF grade A and B recommendations reviewed with patient; age-appropriate recommendations, preventive care, screening tests, etc discussed and encouraged; healthy living encouraged; see AVS for patient education given to patient  

## 2016-11-09 LAB — COMPREHENSIVE METABOLIC PANEL WITH GFR
ALT: 18 IU/L (ref 0–32)
AST: 16 IU/L (ref 0–40)
Albumin/Globulin Ratio: 1.9 (ref 1.2–2.2)
Albumin: 4.6 g/dL (ref 3.6–4.8)
Alkaline Phosphatase: 72 IU/L (ref 39–117)
BUN/Creatinine Ratio: 13 (ref 12–28)
BUN: 12 mg/dL (ref 8–27)
Bilirubin Total: 0.5 mg/dL (ref 0.0–1.2)
CO2: 26 mmol/L (ref 20–29)
Calcium: 9.6 mg/dL (ref 8.7–10.3)
Chloride: 104 mmol/L (ref 96–106)
Creatinine, Ser: 0.92 mg/dL (ref 0.57–1.00)
GFR calc Af Amer: 77 mL/min/1.73
GFR calc non Af Amer: 67 mL/min/1.73
Globulin, Total: 2.4 g/dL (ref 1.5–4.5)
Glucose: 90 mg/dL (ref 65–99)
Potassium: 4.5 mmol/L (ref 3.5–5.2)
Sodium: 143 mmol/L (ref 134–144)
Total Protein: 7 g/dL (ref 6.0–8.5)

## 2016-11-09 LAB — CBC WITH DIFFERENTIAL/PLATELET
Basophils Absolute: 0 x10E3/uL (ref 0.0–0.2)
Basos: 0 %
EOS (ABSOLUTE): 0.1 x10E3/uL (ref 0.0–0.4)
Eos: 2 %
Hematocrit: 44.2 % (ref 34.0–46.6)
Hemoglobin: 15.5 g/dL (ref 11.1–15.9)
Immature Grans (Abs): 0 x10E3/uL (ref 0.0–0.1)
Immature Granulocytes: 0 %
Lymphocytes Absolute: 0.8 x10E3/uL (ref 0.7–3.1)
Lymphs: 17 %
MCH: 33.1 pg — ABNORMAL HIGH (ref 26.6–33.0)
MCHC: 35.1 g/dL (ref 31.5–35.7)
MCV: 94 fL (ref 79–97)
Monocytes Absolute: 0.4 x10E3/uL (ref 0.1–0.9)
Monocytes: 7 %
Neutrophils Absolute: 3.6 x10E3/uL (ref 1.4–7.0)
Neutrophils: 74 %
Platelets: 149 x10E3/uL — ABNORMAL LOW (ref 150–379)
RBC: 4.68 x10E6/uL (ref 3.77–5.28)
RDW: 14.5 % (ref 12.3–15.4)
WBC: 5 x10E3/uL (ref 3.4–10.8)

## 2016-11-09 LAB — LIPID PANEL
CHOLESTEROL TOTAL: 163 mg/dL (ref 100–199)
Chol/HDL Ratio: 1.9 ratio (ref 0.0–4.4)
HDL: 85 mg/dL (ref >39)
LDL Calculated: 62 mg/dL (ref 0–99)
TRIGLYCERIDES: 79 mg/dL (ref 0–149)
VLDL CHOLESTEROL CAL: 16 mg/dL (ref 5–40)

## 2016-11-09 LAB — HEMOGLOBIN A1C
Est. average glucose Bld gHb Est-mCnc: 108 mg/dL
Hgb A1c MFr Bld: 5.4 % (ref 4.8–5.6)

## 2016-11-09 LAB — TSH: TSH: 2.6 u[IU]/mL (ref 0.450–4.500)

## 2016-11-10 LAB — PAP IG AND HPV HIGH-RISK
HPV, HIGH-RISK: NEGATIVE
PAP SMEAR COMMENT: 0

## 2017-01-16 ENCOUNTER — Ambulatory Visit: Payer: 59

## 2017-01-16 ENCOUNTER — Encounter: Payer: Self-pay | Admitting: Podiatry

## 2017-01-16 ENCOUNTER — Ambulatory Visit (INDEPENDENT_AMBULATORY_CARE_PROVIDER_SITE_OTHER): Payer: 59 | Admitting: Podiatry

## 2017-01-16 DIAGNOSIS — M2580 Other specified joint disorders, unspecified joint: Secondary | ICD-10-CM | POA: Diagnosis not present

## 2017-01-16 DIAGNOSIS — M722 Plantar fascial fibromatosis: Secondary | ICD-10-CM | POA: Diagnosis not present

## 2017-01-16 DIAGNOSIS — M79672 Pain in left foot: Secondary | ICD-10-CM

## 2017-01-16 MED ORDER — BETAMETHASONE SOD PHOS & ACET 6 (3-3) MG/ML IJ SUSP: 3.0000 mg | Freq: Once | INTRAMUSCULAR | Status: DC

## 2017-01-16 MED ORDER — MELOXICAM 15 MG PO TABS: 15.0000 mg | ORAL_TABLET | Freq: Every day | ORAL | 1 refills | Status: AC

## 2017-01-16 NOTE — Progress Notes (Signed)
   Subjective: 61 year old female presents the office today for pain to the left arch and also to the left great toe that has been going on for several weeks. Patient was last seen on 10/24/2016 for posterior tibial tendinitis. She states the pain regarding the posterior tibial tendon has improved significantly. Still has some low-grade chronic pain. She believes ankle brace helps however it causes irritation on the dorsal aspect of her foot.   Past Medical History:  Diagnosis Date  . Bilateral hearing loss    wears hearing aids  . Depression   . Genital herpes   . GERD (gastroesophageal reflux disease)   . Herpes simplex virus infection   . Murmur   . Neuropathy of both feet 06/05/2015  . OA (osteoarthritis) of hip    left  . Plantar fasciitis   . Stomatitis      Objective: Physical Exam General: The patient is alert and oriented x3 in no acute distress.  Dermatology: Skin is warm, dry and supple bilateral lower extremities. Negative for open lesions or macerations bilateral.   Vascular: Dorsalis Pedis and Posterior Tibial pulses palpable bilateral.  Capillary fill time is immediate to all digits.  Neurological: Epicritic and protective threshold intact bilateral.   Musculoskeletal: Tenderness to palpation at the medial calcaneal tubercale and through the insertion of the plantar fascia of the left foot. All other joints range of motion within normal limits bilateral. Strength 5/5 in all groups bilateral.  Pain on palpation also noted to the first MPJ left foot consistent with fibular sesmoiditis    Assessment: 1. Plantar fasciitis left foot 2. Fibular sesmoiditis left  Plan of Care:  1. Patient evaluated. Xrays reviewed.   2. Injection of 0.5cc Celestone soluspan injected into the left plantar fascia and sesmoidal apparatus left foot. .  3. Prescription for meloxicam  4. Orders placed for physical therapy at [facility] 3x/week x 4 weeks to address plantar fascial pain 5. RTC  8 weeks  Felecia Shelling, DPM [facility]  Dr. Felecia Shelling, DPM    [address] [place], [place] [address]                Office [phone]  Fax [phone]

## 2017-01-18 ENCOUNTER — Telehealth: Payer: Self-pay | Admitting: Podiatry

## 2017-01-18 ENCOUNTER — Telehealth: Payer: Self-pay

## 2017-01-18 DIAGNOSIS — M722 Plantar fascial fibromatosis: Secondary | ICD-10-CM

## 2017-01-18 NOTE — Telephone Encounter (Signed)
Patient called and said that she got an injection with Dr. Logan Bores this week and she also was put on meloxicam. Patient says her face has turned red and splotchy. She stopped taking the drug, and started taking benadryl. Patient wants to know if the injection that she got is the same thing he has done in the past, or something new? She has never had a reaction to Meloxicam before,. Would like a nurse call back.

## 2017-01-18 NOTE — Telephone Encounter (Signed)
-----   Message from Felecia Shelling, DPM sent at 01/16/2017  6:31 PM EDT -----  Please order physical therapy 3x/week x 4 weeks at [facility] per patient's request.   Dx: plantar fasciitis left foot  Thanks, Dr. Logan Bores

## 2017-01-18 NOTE — Telephone Encounter (Signed)
Returned patient call and informed her that flushing is normal with cortisone and it should subside in the next few days.  I informed her that if symptoms progress or become worse, she can call office back or go to ED.

## 2017-01-18 NOTE — Telephone Encounter (Signed)
Order has been entered in chart for PT

## 2017-02-06 ENCOUNTER — Ambulatory Visit: Payer: 59 | Attending: Podiatry | Admitting: Physical Therapy

## 2017-02-06 ENCOUNTER — Encounter: Payer: Self-pay | Admitting: Physical Therapy

## 2017-02-06 DIAGNOSIS — M79672 Pain in left foot: Secondary | ICD-10-CM | POA: Diagnosis present

## 2017-02-06 DIAGNOSIS — M6281 Muscle weakness (generalized): Secondary | ICD-10-CM | POA: Insufficient documentation

## 2017-02-06 DIAGNOSIS — R262 Difficulty in walking, not elsewhere classified: Secondary | ICD-10-CM | POA: Diagnosis present

## 2017-02-07 NOTE — Therapy (Signed)
[place] [facility] [address], [place], [address] Phone: [phone]   Fax:  [phone]  Physical Therapy Evaluation  Patient Details  Name: Olivia Morgan MRN: [id number] Date of Birth: Oct 22, 1955 Referring Provider: Gala Lewandowsky MD  Encounter Date: 02/06/2017      PT End of Session - 02/06/17 1325    Visit Number 1   Number of Visits 12   Date for PT Re-Evaluation 03/20/17   PT Start Time 1300   PT Stop Time 1405   PT Time Calculation (min) 65 min   Activity Tolerance Patient tolerated treatment well   Behavior During Therapy [facility] for tasks assessed/performed      Past Medical History:  Diagnosis Date  . Bilateral hearing loss    wears hearing aids  . Depression   . Genital herpes   . GERD (gastroesophageal reflux disease)   . Herpes simplex virus infection   . Murmur   . Neuropathy of both feet 06/05/2015  . OA (osteoarthritis) of hip    left  . Plantar fasciitis   . Stomatitis     Past Surgical History:  Procedure Laterality Date  . CESAREAN SECTION    . COLONOSCOPY  2010  . FOOT SURGERY Right   . 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 TOTAL HIP ARTHROPLASTY Left 10/2008  . UPPER GI ENDOSCOPY  1992    There were no vitals filed for this visit.       Subjective Assessment - 02/06/17 1332    Subjective Patient report pain in left foot with walking, standing    Pertinent History Pt reports plantarfasciitis for multiple years. It has been aggravated since her R knee replacement. worsening symptoms since 11/17. She has hd injections, used night splints, and orthotics with mixed results. She has left THA with resultant left lateral foot numbness.    Limitations Standing;Walking;House hold activities   Patient Stated Goals improve function with walking, standing    Currently in Pain? Yes   Pain Score 5    Pain Location Foot   Pain Orientation Left   Pain  Descriptors / Indicators Aching;Tightness   Pain Type Chronic pain   Pain Onset More than a month ago   Pain Frequency Intermittent   Aggravating Factors  walking, wearing dress shoes    Pain Relieving Factors ice water bottle, stretches, medication            [facility] PT Assessment - 02/06/17 1409      Assessment   Medical Diagnosis left foot plantar fasciitis   Referring Provider Gala Lewandowsky MD   Onset Date/Surgical Date 02/09/16     Precautions   Precautions None     Home Environment   Living Environment Private residence     Prior Function   Level of Independence Independent   Vocation Full time employment   Vocation Requirements sitting/labwork.   Leisure dancing, walking, staying active     Cognition   Overall Cognitive Status Within Functional Limits for tasks assessed     ROM / Strength   AROM / PROM / Strength AROM;Strength     AROM   Overall AROM Comments ankle DF left 15/ right 20; PF lefft 30; right 35 degrees     Strength   Overall Strength Comments hip flexion both 4/5, Knee extension/flexion 5/5 LLE, RLE: 5/5 extension, 4+/5 flexion, ankle DF 5/5 bilateral. Hip IR/ER grossly 4/5 bilateral; left foot toe  flexion decreased, toe extension decreased      Palpation   Palpation comment left foot with decresed soft tissue mobility along plantar aspect and medial arch     posture: standing: decreased arch with weight bearing left LE       Objective measurements completed on examination: See above findings.                  PT Education - 02/06/17 1537    Education provided Yes   Education Details POC: home exercises for foot intrinsics with ankle DF/PF   Person(s) Educated Patient   Methods Explanation;Demonstration;Verbal cues;Handout   Comprehension Verbalized understanding;Verbal cues required;Returned demonstration             PT Long Term Goals - 02/06/17 1627      PT LONG TERM GOAL #1   Title Patient will demonstrate  improved function with daily activties involving standing, walking with mild pain left foot with LEFS score of 59/80 or better   Baseline LEFS 49/80   Status New   Target Date 03/06/17     PT LONG TERM GOAL #2   Title Patient will be independent with self management of symptoms/ pain left foot in order to transition to independent home program once discharged from physical therapy   Baseline limited knowledge of appropriate exercises, pain control strategies without assistance and moderate cuing/guidance   Status New   Target Date 03/20/17                Plan - 02/06/17 1430    Clinical Impression Statement Patient is a 61 year old female with recurrent left foot pain, plantarfasciitis. She has limitations with walking, standing activties and an LEFS score of 49/80 indicating moderate self perceived disability. She has increased pronation with decresaed arch left foot and decreased soft tissue mobility which contrubute to her pain. She has limited knowledge of appropriate exercises, pain control strategies and progression and will therefore benefit from physical therapy intervention.     History and Personal Factors relevant to plan of care: Pt reports plantarfasciitis for multiple years. It has been aggravated since her R knee replacement. worsening symptoms since 11/17. She has hd injections, used night splints, and orthotics with mixed results. She has left THA with resultant left lateral foot numbness.    Clinical Presentation Evolving   Clinical Presentation due to: worsening symptoms with decreased function   Clinical Decision Making Low   Rehab Potential Good   Clinical Impairments Affecting Rehab Potential (+)motivated, prior level of function(-)chronic condition   PT Frequency 2x / week   PT Duration 6 weeks   PT Treatment/Interventions Electrical Stimulation;Cryotherapy;Moist Heat;Ultrasound;Patient/family education;Neuromuscular re-education;Therapeutic exercise;Manual  techniques;Dry needling   PT Next Visit Plan pain control, manual therapy STM, progress strengthening   PT Home Exercise Plan pain control, foot intrinsics with toe flexion/extension   Consulted and Agree with Plan of Care Patient      Patient will benefit from skilled therapeutic intervention in order to improve the following deficits and impairments:  Decreased strength, Decreased activity tolerance, Impaired perceived functional ability, Pain, Increased muscle spasms, Difficulty walking  Visit Diagnosis: Muscle weakness (generalized) - Plan: PT plan of care cert/re-cert  Difficulty in walking, not elsewhere classified - Plan: PT plan of care cert/re-cert  Pain in left foot - Plan: PT plan of care cert/re-cert     Problem List Patient Active Problem List   Diagnosis Date Noted  . Allergic rhinitis 04/16/2016  . Vitamin D deficiency 11/11/2015  .  Preventative health care 11/11/2015  . Osteopenia 11/11/2015  . Mass of left lower leg 09/07/2015  . Adverse drug reaction 06/13/2015  . Overweight (BMI 25.0-29.9) 06/13/2015  . Greater trochanteric bursitis of left hip 06/05/2015  . Bunion of left foot 06/05/2015  . Neuropathy of both feet 06/05/2015  . 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 Thrombocytopenia (HCC) 04/05/2015  . Esophageal reflux 03/19/2015  . Need for shingles vaccine 03/14/2015  . Lower back pain 03/10/2015  . Dysphagia 03/10/2015  . Herpes simplex virus infection   . OA (osteoarthritis) of hip   . GERD (gastroesophageal reflux disease)   . Plantar fasciitis   . Depression   . Genital herpes   . Bilateral hearing loss     Beacher MayBrooks, Marie PT 02/07/2017, 4:44 PM  [facility] [facility] [address] [place], [place], [id number] Phone: [phone]   Fax:  [phone]  Name: Olivia Morgan MRN: [id number] Date of Birth: [birth year]

## 2017-02-08 ENCOUNTER — Ambulatory Visit: Payer: 59 | Admitting: Physical Therapy

## 2017-02-12 ENCOUNTER — Ambulatory Visit: Payer: 59 | Admitting: Family Medicine

## 2017-02-12 ENCOUNTER — Encounter: Payer: Self-pay | Admitting: Family Medicine

## 2017-02-12 VITALS — BP 134/82 | HR 91 | Temp 97.8°F | Ht 63.75 in | Wt 180.4 lb

## 2017-02-12 DIAGNOSIS — T7840XA Allergy, unspecified, initial encounter: Secondary | ICD-10-CM

## 2017-02-12 DIAGNOSIS — L309 Dermatitis, unspecified: Secondary | ICD-10-CM | POA: Diagnosis not present

## 2017-02-12 DIAGNOSIS — T7840XD Allergy, unspecified, subsequent encounter: Secondary | ICD-10-CM | POA: Diagnosis not present

## 2017-02-12 DIAGNOSIS — Z7189 Other specified counseling: Secondary | ICD-10-CM

## 2017-02-12 DIAGNOSIS — Z7185 Encounter for immunization safety counseling: Secondary | ICD-10-CM

## 2017-02-12 HISTORY — DX: Allergy, unspecified, initial encounter: T78.40XA

## 2017-02-12 NOTE — Patient Instructions (Addendum)
Try avoiding milk and all milk products for one month to see if that helps with the drainage, clearing of the throat Check out One [employer] and other websites for ArvinMeritormeatless meal ideas and recipes Try meatless crumbles, Boca burgers, Sweet Earth seitan, [employer] vegan sausage mix (all plant protein), Sweet Earth [place] breakfast burritos, etc. You can further limit intake of saturated fats by switching to dairy free products (coconut milk coffee creamer, almond milk, Tofutti brand cream cheese and sour cream, Follow Your Heart vegenaise (mayonnaise alternative) and Follow Your Heart cheese alternative (the Weldon Inchesgouda is my favorite) Almond milk is recommended over soy milk in terms of calcium Request the dermatology notes (again) and the dermatopathology report from Dr. Durene Calasher's office We'll be glad to contact you when we get the Shingrix available

## 2017-02-12 NOTE — Assessment & Plan Note (Signed)
Discussed going dairy free, will do testing today; refer for further testing if nothing obvious

## 2017-02-12 NOTE — Progress Notes (Signed)
BP 134/82 (BP Location: Left Arm, Patient Position: Sitting, Cuff Size: Large)   Pulse 91   Temp 97.8 F (36.6 C) (Oral)   Ht 5' 3.75" (1.619 m)   Wt 180 lb 6.4 oz (81.8 kg)   SpO2 97%   BMI 31.21 kg/m    Subjective:    Patient ID: Olivia Princeeresa A Murfin, female    DOB: 07/20/1955, 61 y.o.   MRN: [id number]  HPI: Olivia Morgan is a 61 y.o. female  Chief Complaint  Patient presents with  . Nasal Congestion    Continous, seems as though she cannot get it to go away   . Oral Pain    Concerned she may have an allergy   . Immunizations    Pt to discuss flu vaccine, discuss shingrix vaccine   . Rash    HPI Patient has been bothered by mouth and tongue burning She has been to see two different ENTs but they did not do any food allergies Eating more cheetos and wondered if something in there; chicken biscuit crackers with chicken salad, maybe a lot of salt Food dye, milk products, made in facility with peanuts and tree nuts No hx of allergy to nuts; she bought a can of cashews and those actually burned her mouth too Would like allergy testing No trouble breathing or wheezing She has congestion; no in her nose, but not in the throat; feels like she can't blow it out; just there  She had some places on her face and had biopsy done by dermatologist They gave her clindamycin phosphate; they stay for week She avoid down pillows She gets a painful spot sore to the touch that comes up, then feels rough and gets red; the one on her face is the oldest, one on the right upper breast/chest is two weeks old; they never scab up and they just flake  She has mixed feelings about the flu shot She asked about the new shingles   Cannot exercise because of her left foot; had cortisone shots in it, going to start therapy soon; face got hot and red the next day; they told her it was not an allergic reaction  [facility] 02/12/2017 11/08/2016 11/11/2015 09/07/2015  Decreased Interest 0 0 0  0  Down, Depressed, Hopeless 0 1 0 0  PHQ - 2 Score 0 1 0 0    Relevant past medical, surgical, family and social history reviewed Past Medical History:  Diagnosis Date  . Bilateral hearing loss    wears hearing aids  . Depression   . Genital herpes   . GERD (gastroesophageal reflux disease)   . Herpes simplex virus infection   . Murmur   . Neuropathy of both feet 06/05/2015  . OA (osteoarthritis) of hip    left  . Plantar fasciitis   . Stomatitis    Past Surgical History:  Procedure Laterality Date  . CESAREAN SECTION    . COLONOSCOPY  2010  . FOOT SURGERY Right   . REPLAC[facility] K[facility]  . TOTAL HIP ARTHROPLASTY Left 10/2008  . UPPER GI ENDOSCOPY  1992   Family History  Problem Relation Age of Onset  . Aneurysm Mother   . Stroke Mother   . Heart disease Mother   . Heart disease Father 1252  . Hypertension Father   . Lung disease Father   . Heart disease Paternal Grandfather   . Panic disorder Brother   .  Milk intolerance Daughter   . Polycystic ovary syndrome Daughter   . Obesity Brother   . Hyperlipidemia Brother   . Hypertension Brother   . Diabetes Brother   . Cancer Neg Hx   . COPD Neg Hx   . Breast cancer Neg Hx    Social History   Socioeconomic History  . Marital status: Divorced    Spouse name: Not on file  . Number of children: Not on file  . Years of education: Not on file  . Highest education level: Not on file  Social Needs  . Financial resource strain: Not on file  . Food insecurity - worry: Not on file  . Food insecurity - inability: Not on file  . Transportation needs - medical: Not on file  . Transportation needs - non-medical: Not on file  Occupational History  . Not on file  Tobacco Use  . Smoking status: Never Smoker  . Smokeless tobacco: Never Used  Substance and Sexual Activity  . Alcohol use: Yes    Comment: socially  . Drug use: No  . Sexual activity: Yes    Birth control/protection:  Post-menopausal  Other Topics Concern  . Not on file  Social History Narrative  . Not on file    Interim medical history since last visit reviewed. Allergies and medications reviewed  Review of Systems Per HPI unless specifically indicated above     Objective:    BP 134/82 (BP Location: Left Arm, Patient Position: Sitting, Cuff Size: Large)   Pulse 91   Temp 97.8 F (36.6 C) (Oral)   Ht 5' 3.75" (1.619 m)   Wt 180 lb 6.4 oz (81.8 kg)   SpO2 97%   BMI 31.21 kg/m   Wt Readings from Last 3 Encounters:  02/12/17 180 lb 6.4 oz (81.8 kg)  11/08/16 172 lb 9.6 oz (78.3 kg)  04/05/16 168 lb 5 oz (76.3 kg)    Physical Exam  Constitutional: She appears well-developed and well-nourished. No distress.  Obese, weight gain  Eyes: EOM are normal. No scleral icterus.  Cardiovascular: Normal rate.  Pulmonary/Chest: Effort normal. No respiratory distress.  Abdominal: She exhibits no distension.  Musculoskeletal: She exhibits no edema.  Skin: No pallor.  Lesion on the right cheek, right upper chest/breast, and right of midline mid back under bra line; all three have some induration, erythema with scabbed or rough surface  Psychiatric: She has a normal mood and affect. Her behavior is normal. Judgment and thought content normal.       Assessment & Plan:   Problem List Items Addressed This Visit      Musculoskeletal and Integument   Dermatitis    Seeing dermatologist; I reviewed media tab, labs, etc and cannot find records from dermatologist or the biopsy that was done; will request again today        Other   Allergic reaction - Primary    Discussed going dairy free, will do testing today; refer for further testing if nothing obvious      Relevant Orders   Allergy Panel 18, Nut Mix Group   Allergen Profile, Food-Milk    Other Visit Diagnoses    Vaccine counseling       discussed shingrix vaccine; we do not have this available, but can be gotten at local pharmacy; two part  series       Follow up plan: No Follow-up on file.  An after-visit summary was printed and given to the patient at check-out.  Please  see the patient instructions which may contain other information and recommendations beyond what is mentioned above in the assessment and plan.  Meds ordered this encounter  Medications  . vitamin B-12 (CYANOCOBALAMIN) 1000 MCG tablet    Sig: Take by mouth.  . IRON PO    Sig: Take by mouth.  . SUPER B COMPLEX/C PO    Sig: Take daily by mouth.    Orders Placed This Encounter  Procedures  . Allergy Panel 18, Nut Mix Group  . Allergen Profile, Food-Milk

## 2017-02-12 NOTE — Assessment & Plan Note (Signed)
Seeing dermatologist; I reviewed media tab, labs, etc and cannot find records from dermatologist or the biopsy that was done; will request again today

## 2017-02-13 ENCOUNTER — Ambulatory Visit: Payer: 59 | Admitting: Podiatry

## 2017-02-13 ENCOUNTER — Ambulatory Visit: Payer: 59 | Attending: Podiatry | Admitting: Physical Therapy

## 2017-02-13 ENCOUNTER — Encounter: Payer: Self-pay | Admitting: Physical Therapy

## 2017-02-13 DIAGNOSIS — M6281 Muscle weakness (generalized): Secondary | ICD-10-CM | POA: Diagnosis present

## 2017-02-13 DIAGNOSIS — R262 Difficulty in walking, not elsewhere classified: Secondary | ICD-10-CM | POA: Diagnosis present

## 2017-02-13 DIAGNOSIS — M79672 Pain in left foot: Secondary | ICD-10-CM

## 2017-02-14 NOTE — Therapy (Signed)
[facility]INE [address], [place], [address] Phone: [phone]   Fax:  [phone]  Physical Therapy Treatment  Patient Details  Name: Olivia Morgan MRN: [id number] Date of Birth: Nov 23, 1955 Referring Provider: Gala Lewandowsky MD   Encounter Date: 02/13/2017  PT End of Session - 02/13/17 1311    Visit Number  2    Number of Visits  12    Date for PT Re-Evaluation  03/20/17    PT Start Time  1305    PT Stop Time  1340    PT Time Calculation (min)  35 min    Activity Tolerance  Patient tolerated treatment well    Behavior During Therapy  [facility] for tasks assessed/performed       Past Medical History:  Diagnosis Date  . Bilateral hearing loss    wears hearing aids  . Depression   . Genital herpes   . GERD (gastroesophageal reflux disease)   . Herpes simplex virus infection   . Murmur   . Neuropathy of both feet 06/05/2015  . OA (osteoarthritis) of hip    left  . Plantar fasciitis   . Stomatitis     Past Surgical History:  Procedure Laterality Date  . CESAREAN SECTION    . COLONOSCOPY  2010  . FOOT SURGERY Right   . 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 TOTAL HIP ARTHROPLASTY Left 10/2008  . UPPER GI ENDOSCOPY  1992    There were no vitals filed for this visit.  Subjective Assessment - 02/13/17 1308    Subjective  Patient reports increased soreness in hips and LE's     Pertinent History  Pt reports plantarfasciitis for multiple years. It has been aggravated since her R knee replacement. worsening symptoms since 11/17. She has hd injections, used night splints, and orthotics with mixed results. She has left THA with resultant left lateral foot numbness.     Limitations  Standing;Walking;House hold activities    Patient Stated Goals  improve function with walking, standing     Currently in Pain?  Yes    Pain Score  3     Pain Location  Foot    Pain Orientation  Left     Pain Descriptors / Indicators  Aching;Tightness    Pain Type  Chronic pain    Pain Onset  More than a month ago    Pain Frequency  Intermittent        Objective: Palpation: + tenderness and decreased soft tissue elasticity along plantar aspect of left foot, distal calf muscle left LE Strength: decreased foot and ankle strength left LE  Treatment:  Manual therapy: 15 min. Goal: improve soft tissue elasticity, gait/weight bearing STM performed to left LE calf and foot, plantar aspect with focus on plantar fascia close to heel; superficial, deep and compression techniques  Therapeutic exercise: patient performed with instruction/demonstration, verbal and tactile cues of therapist; goal: improve strength, functional gait, LEFS With patient long sitting; performed to left LE: Manual graded resistance for toe flexion and extension with ankle DF with flexion and PF with toe extension x 10 reps each with up to 5 second holds Manual graded resistance for ankle DF/PF and eversion x 10 reps each, all with graded resistance in concentric and eccentric strengthening Trunk stretches performed with patient seated at edge of treatment table with exercise ball for forward, left and right positioning 2-3 reps with 10 second  holds Re assessed home exercises given and instructed patient to modify as needed with decreased intensity or frequency of exercises for hip stabilization  Patient response to treatment: patient demonstrated improved technique with exercises with minimal VC for correct alignment and performance.  Patient with improved soft tissue elasticity by 50% following STM. Improved motor control with repetition and cuing.   PT Education - 02/13/17 1310    Education provided  Yes    Education Details  re assessed home exercises     Person(s) Educated  Patient    Methods  Explanation    Comprehension  Verbalized understanding          PT Long Term Goals - 02/06/17 1627      PT LONG  TERM GOAL #1   Title  Patient will demonstrate improved function with daily activties involving standing, walking with mild pain left foot with LEFS score of 59/80 or better    Baseline  LEFS 49/80    Status  New    Target Date  03/06/17      PT LONG TERM GOAL #2   Title  Patient will be independent with self management of symptoms/ pain left foot in order to transition to independent home program once discharged from physical therapy    Baseline  limited knowledge of appropriate exercises, pain control strategies without assistance and moderate cuing/guidance    Status  New    Target Date  03/20/17            Plan - 02/13/17 1345    Clinical Impression Statement  Patient demonstrated improved soft tissue elasticity and is improving knowledge of appropriate exercises and modifications in order to see lasting relief and progress with exercises and goals. She requires VC and guidance to perform correct techniques and progress exercises.     Rehab Potential  Good    Clinical Impairments Affecting Rehab Potential  (+)motivated, prior level of function(-)chronic condition    PT Frequency  2x / week    PT Duration  6 weeks    PT Treatment/Interventions  Electrical Stimulation;Cryotherapy;Moist Heat;Ultrasound;Patient/family education;Neuromuscular re-education;Therapeutic exercise;Manual techniques;Dry needling    PT Next Visit Plan  pain control, manual therapy STM, progress strengthening    PT Home Exercise Plan  pain control, foot intrinsics with toe flexion/extension       Patient will benefit from skilled therapeutic intervention in order to improve the following deficits and impairments:  Decreased strength, Decreased activity tolerance, Impaired perceived functional ability, Pain, Increased muscle spasms, Difficulty walking  Visit Diagnosis: Muscle weakness (generalized)  Difficulty in walking, not elsewhere classified  Pain in left foot     Problem List Patient Active  Problem List   Diagnosis Date Noted  . Allergic reaction 02/12/2017  . Dermatitis 02/12/2017  . Allergic rhinitis 04/16/2016  . Vitamin D deficiency 11/11/2015  . Preventative health care 11/11/2015  . Osteopenia 11/11/2015  . Mass of left lower leg 09/07/2015  . Adverse drug reaction 06/13/2015  . Overweight (BMI 25.0-29.9) 06/13/2015  . Greater trochanteric bursitis of left hip 06/05/2015  . Bunion of left foot 06/05/2015  . Neuropathy of both feet 06/05/2015  . 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 Thrombocytopenia (HCC) 04/05/2015  . Esophageal reflux 03/19/2015  . Need for shingles vaccine 03/14/2015  . Lower back pain 03/10/2015  . Dysphagia 03/10/2015  . Herpes simplex virus infection   . OA (osteoarthritis) of hip   . GERD (gastroesophageal reflux disease)   . Plantar fasciitis   .  Depression   . Genital herpes   . Bilateral hearing loss     Beacher MayBrooks, Adithi Gammon PT 02/14/2017, 10:03 AM  [facility] [address] , [place], [address] Phone: [phone]   Fax:  [phone]  Name: Olivia Morgan MRN: [id number] Date of Birth: Feb 17, 1956

## 2017-02-15 LAB — ALLERGEN PROFILE, FOOD-MILK
F076-IgE Alpha Lactalbumin: <0.1 kU/L
F077-IgE Beta Lactoglobulin: <0.1 kU/L
F078-IgE Casein: <0.1 kU/L

## 2017-02-15 LAB — ALLERGY PANEL 18, NUT MIX GROUP
F202-IgE Cashew Nut: <0.1 kU/L
Hazelnut (Filbert) IgE: <0.1 kU/L
Peanut IgE: <0.1 kU/L

## 2017-02-16 ENCOUNTER — Ambulatory Visit: Payer: 59 | Admitting: Podiatry

## 2017-02-19 ENCOUNTER — Telehealth: Payer: Self-pay

## 2017-02-19 ENCOUNTER — Ambulatory Visit: Payer: 59 | Admitting: Physical Therapy

## 2017-02-19 ENCOUNTER — Encounter: Payer: Self-pay | Admitting: Physical Therapy

## 2017-02-19 ENCOUNTER — Other Ambulatory Visit: Payer: Self-pay

## 2017-02-19 DIAGNOSIS — M6281 Muscle weakness (generalized): Secondary | ICD-10-CM | POA: Diagnosis not present

## 2017-02-19 DIAGNOSIS — M79672 Pain in left foot: Secondary | ICD-10-CM

## 2017-02-19 DIAGNOSIS — R262 Difficulty in walking, not elsewhere classified: Secondary | ICD-10-CM

## 2017-02-19 NOTE — Telephone Encounter (Signed)
-----   Message from Kerman PasseyMelinda P Lada, MD sent at 02/15/2017  5:31 PM EST ----- Please let the patient know that her milk product allergy testing was negative; the nut mix panel was also negative; if she wishes to do further testing, please offer referral to an allergy specialist or ENT who can help her with that; thank you

## 2017-02-19 NOTE — Telephone Encounter (Signed)
Called pt informed her of information below. Pt gave verbal understanding. Declines referral at this time.

## 2017-02-20 NOTE — Therapy (Signed)
[facility]ICINE [address] [place], [place], [address] Phone: [phone]   Fax:  [phone]  Physical Therapy Treatment  Patient Details  Name: Olivia Morgan MRN: [id number] Date of Birth: [birth year] Referring Provider: Gala LewandowskyEvans, Brent MD   Encounter Date: 02/19/2017  PT End of Session - 02/19/17 1820    Visit Number  3    Number of Visits  12    Date for PT Re-Evaluation  03/20/17    PT Start Time  1725    PT Stop Time  1805    PT Time Calculation (min)  40 min    Activity Tolerance  Patient tolerated treatment well    Behavior During Therapy  [facility] for tasks assessed/performed       Past Medical History:  Diagnosis Date  . Bilateral hearing loss    wears hearing aids  . Depression   . Genital herpes   . GERD (gastroesophageal reflux disease)   . Herpes simplex virus infection   . Murmur   . Neuropathy of both feet 06/05/2015  . OA (osteoarthritis) of hip    left  . Plantar fasciitis   . Stomatitis     Past Surgical History:  Procedure Laterality Date  . CESAREAN SECTION    . COLONOSCOPY  2010  . FOOT SURGERY Right   . 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 TOTAL HIP ARTHROPLASTY Left 10/2008  . UPPER GI ENDOSCOPY  1992    There were no vitals filed for this visit.  Subjective Assessment - 02/19/17 1741    Subjective  Patient reports increased pain in left groin that began last week and is worsening as of today. Her left foot is improving with less pain.    Pertinent History  Pt reports plantarfasciitis for multiple years. It has been aggravated since her R knee replacement. worsening symptoms since 11/17. She has hd injections, used night splints, and orthotics with mixed results. She has left THA with resultant left lateral foot numbness.     Limitations  Standing;Walking;House hold activities    Patient Stated Goals  improve function with walking, standing     Currently in Pain?  Other (Comment) left groin pain, left foot is feeling better       Objective: Palpation: + tenderness and decreased soft tissue elasticity along plantar aspect of left foot, improved from previous session and more localized Strength: decreased foot and ankle strength left LE  Treatment:  Manual therapy: 13 min. Goal: improve soft tissue elasticity, gait/weight bearing STM performed to left LE calf and foot, plantar aspect with focus on plantar fascia close to heel; superficial, deep and compression techniques, patient supine  Therapeutic exercise: patient performed with instruction/demonstration, verbal and tactile cues of therapist; goal: improve strength, functional gait, LEFS With patient long sitting; performed to left LE: Manual graded resistance for toe flexion and extension with ankle DF with flexion and PF with toe extension x 10 reps each with up to 5 second holds Manual graded resistance for ankle DF/PF and eversion x 10 reps each, all with graded resistance in concentric and eccentric strengthening  Assessment performed for left groin pain: increased pain with resistive hip IR and adduction; PROM WFL left hip without increased pain; lumbar spine screen AROM with repeated motions no reproduction of symptoms; palpation; mild tenderness inner thigh/adductors proximally  Patient response to treatment: Patient demonstrated improved soft tissue elasticity and decreased tenderness and pain  in left foot by 50% following STM. Improved strength noted with exercise left ankle.         PT Education - 02/19/17 1810    Education provided  Yes    Education Details  self management of symptoms in left LE, use of heat/ice    Person(s) Educated  Patient    Methods  Explanation    Comprehension  Verbalized understanding          PT Long Term Goals - 02/06/17 1627      PT LONG TERM GOAL #1   Title  Patient will demonstrate improved function with daily activties  involving standing, walking with mild pain left foot with LEFS score of 59/80 or better    Baseline  LEFS 49/80    Status  New    Target Date  03/06/17      PT LONG TERM GOAL #2   Title  Patient will be independent with self management of symptoms/ pain left foot in order to transition to independent home program once discharged from physical therapy    Baseline  limited knowledge of appropriate exercises, pain control strategies without assistance and moderate cuing/guidance    Status  New    Target Date  03/20/17            Plan - 02/19/17 1911    Clinical Impression Statement  Patient demonstrates steady improvement with left foot pain, improved soft tissue elasticity. She presents today with increased pain in left hip/groin region which is painful with resistive hip IR and adduction. She is to monitor symptoms and contact MD if symptoms do not improve.    Rehab Potential  Good    Clinical Impairments Affecting Rehab Potential  (+)motivated, prior level of function(-)chronic condition    PT Frequency  2x / week    PT Duration  6 weeks    PT Treatment/Interventions  Electrical Stimulation;Cryotherapy;Moist Heat;Ultrasound;Patient/family education;Neuromuscular re-education;Therapeutic exercise;Manual techniques;Dry needling    PT Next Visit Plan  pain control, manual therapy STM, progress strengthening    PT Home Exercise Plan  pain control, foot intrinsics with toe flexion/extension. self STM       Patient will benefit from skilled therapeutic intervention in order to improve the following deficits and impairments:  Decreased strength, Decreased activity tolerance, Impaired perceived functional ability, Pain, Increased muscle spasms, Difficulty walking  Visit Diagnosis: Muscle weakness (generalized)  Difficulty in walking, not elsewhere classified  Pain in left foot     Problem List Patient Active Problem List   Diagnosis Date Noted  . Allergic reaction 02/12/2017  .  Dermatitis 02/12/2017  . Allergic rhinitis 04/16/2016  . Vitamin D deficiency 11/11/2015  . Preventative health care 11/11/2015  . Osteopenia 11/11/2015  . Mass of left lower leg 09/07/2015  . Adverse drug reaction 06/13/2015  . Overweight (BMI 25.0-29.9) 06/13/2015  . Greater trochanteric bursitis of left hip 06/05/2015  . Bunion of left foot 06/05/2015  . Neuropathy of both feet 06/05/2015  . 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 Thrombocytopenia (HCC) 04/05/2015  . Esophageal reflux 03/19/2015  . Need for shingles vaccine 03/14/2015  . Lower back pain 03/10/2015  . Dysphagia 03/10/2015  . Herpes simplex virus infection   . OA (osteoarthritis) of hip   . GERD (gastroesophageal reflux disease)   . Plantar fasciitis   . Depression   . Genital herpes   . Bilateral hearing loss     Beacher MayBrooks, Marie PT 02/20/2017, 7:18 PM  [facility] [address], [place], [address] Phone: [phone]   Fax:  [phone]  Name: Olivia Morgan MRN: [id number] Date of Birth: 09/14/1955

## 2017-02-21 ENCOUNTER — Ambulatory Visit: Payer: 59 | Admitting: Physical Therapy

## 2017-02-26 ENCOUNTER — Encounter: Payer: Self-pay | Admitting: Physical Therapy

## 2017-02-26 ENCOUNTER — Ambulatory Visit: Payer: 59 | Admitting: Physical Therapy

## 2017-02-26 ENCOUNTER — Other Ambulatory Visit: Payer: Self-pay

## 2017-02-26 DIAGNOSIS — M79672 Pain in left foot: Secondary | ICD-10-CM

## 2017-02-26 DIAGNOSIS — M6281 Muscle weakness (generalized): Secondary | ICD-10-CM

## 2017-02-26 DIAGNOSIS — R262 Difficulty in walking, not elsewhere classified: Secondary | ICD-10-CM

## 2017-02-28 ENCOUNTER — Encounter: Payer: 59 | Admitting: Physical Therapy

## 2017-02-28 NOTE — Therapy (Signed)
[facility] [address] [place], [place], [address] Phone: [phone]   Fax:  [phone]  Physical Therapy Treatment  Patient Details  Name: Olivia Morgan MRN: [id number] Date of Birth: 06-30-1955 Referring Provider: Gala LewandowskyEvans, Brent MD   Encounter Date: 02/26/2017  PT End of Session - 02/26/17 1822    Visit Number  4    Number of Visits  12    Date for PT Re-Evaluation  03/20/17    PT Start Time  1746    PT Stop Time  1818    PT Time Calculation (min)  32 min    Activity Tolerance  Patient tolerated treatment well    Behavior During Therapy  [facility] for tasks assessed/performed       Past Medical History:  Diagnosis Date  . Bilateral hearing loss    wears hearing aids  . Depression   . Genital herpes   . GERD (gastroesophageal reflux disease)   . Herpes simplex virus infection   . Murmur   . Neuropathy of both feet 06/05/2015  . OA (osteoarthritis) of hip    left  . Plantar fasciitis   . Stomatitis     Past Surgical History:  Procedure Laterality Date  . CESAREAN SECTION    . COLONOSCOPY  2010  . FOOT SURGERY Right   . [facility]PLAC[facility]  . TOTAL HIP ARTHROPLASTY Left 10/2008  . UPPER GI ENDOSCOPY  1992    There were no vitals filed for this visit.  Subjective Assessment - 02/26/17 1746    Subjective  patient reports increased pain in left groin following increased activity with crawling and moving quite a bit.     Pertinent History  Pt reports plantarfasciitis for multiple years. It has been aggravated since her R knee replacement. worsening symptoms since 11/17. She has hd injections, used night splints, and orthotics with mixed results. She has left THA with resultant left lateral foot numbness.     Limitations  Standing;Walking;House hold activities    Patient Stated Goals  improve function with walking, standing     Currently in Pain?  Other  (Comment) left groin pain is worse than left foot         Objective: Palpation: + tenderness and decreased soft tissue elasticity along plantar aspect of left foot, improved from previous session and more localized Strength: improved left LE and foot strength as compared to previous session  Treatment:  Manual therapy: 13 min. Goal: improve soft tissue elasticity, gait/weight bearing STM performed to left LE calf and foot, plantar aspect with focus on plantar fascia close to heel; superficial, deep and compression techniques, patient supine  Therapeutic exercise: patient performed with instruction/demonstration, verbal and tactile cues of therapist; goal: improve strength, functional gait, LEFS With patient long sitting; performed to left LE: Manual graded resistance for toe flexion and extension with ankle DF with flexion and PF with toe extension x 10 reps each with up to 5 second holds Manual graded resistance for ankle DF/PF and eversion x 10 reps each, all with graded resistance in concentric and eccentric strengthening  Patient response to treatment: Patient demonstrated improved soft tissue elasticity and decreased tenderness and pain in left foot by 50% following STM. Improved motor control and strength with exercises with minimal cuing .    PT Education - 02/26/17 1800    Education provided  Yes    Education Details  re assessed home program    Person(s) Educated  Patient    Methods  Explanation    Comprehension  Verbalized understanding          PT Long Term Goals - 02/06/17 1627      PT LONG TERM GOAL #1   Title  Patient will demonstrate improved function with daily activties involving standing, walking with mild pain left foot with LEFS score of 59/80 or better    Baseline  LEFS 49/80    Status  New    Target Date  03/06/17      PT LONG TERM GOAL #2   Title  Patient will be independent with self management of symptoms/ pain left foot in order to transition  to independent home program once discharged from physical therapy    Baseline  limited knowledge of appropriate exercises, pain control strategies without assistance and moderate cuing/guidance    Status  New    Target Date  03/20/17            Plan - 02/26/17 1820    Clinical Impression Statement  Patient  demosntrates steady improvement with left foot pain and improved soft tisue elasitcity in left foot. She continues with pain in hip and will continue to improve with additional physical therapy intervention.     Rehab Potential  Good    Clinical Impairments Affecting Rehab Potential  (+)motivated, prior level of function(-)chronic condition    PT Frequency  2x / week    PT Duration  6 weeks    PT Treatment/Interventions  Electrical Stimulation;Cryotherapy;Moist Heat;Ultrasound;Patient/family education;Neuromuscular re-education;Therapeutic exercise;Manual techniques;Dry needling    PT Next Visit Plan  pain control, manual therapy STM, progress strengthening    PT Home Exercise Plan  pain control, foot intrinsics with toe flexion/extension. self STM       Patient will benefit from skilled therapeutic intervention in order to improve the following deficits and impairments:  Decreased strength, Decreased activity tolerance, Impaired perceived functional ability, Pain, Increased muscle spasms, Difficulty walking  Visit Diagnosis: Muscle weakness (generalized)  Difficulty in walking, not elsewhere classified  Pain in left foot     Problem List Patient Active Problem List   Diagnosis Date Noted  . Allergic reaction 02/12/2017  . Dermatitis 02/12/2017  . Allergic rhinitis 04/16/2016  . Vitamin D deficiency 11/11/2015  . Preventative health care 11/11/2015  . Osteopenia 11/11/2015  . Mass of left lower leg 09/07/2015  . Adverse drug reaction 06/13/2015  . Overweight (BMI 25.0-29.9) 06/13/2015  . Greater trochanteric bursitis of left hip 06/05/2015  . Bunion of left foot  06/05/2015  . Neuropathy of both feet 06/05/2015  . 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 Thrombocytopenia (HCC) 04/05/2015  . Esophageal reflux 03/19/2015  . Need for shingles vaccine 03/14/2015  . Lower back pain 03/10/2015  . Dysphagia 03/10/2015  . Herpes simplex virus infection   . OA (osteoarthritis) of hip   . GERD (gastroesophageal reflux disease)   . Plantar fasciitis   . Depression   . Genital herpes   . Bilateral hearing loss     Beacher MayBrooks, Marie PT 02/28/2017, 12:04 AM  [facility] [address] [place], [place], [address] Phone: [phone]   Fax:  [phone]  Name: Olivia Morgan MRN: [id number] Date of Birth: 06-Jun-1955

## 2017-03-05 ENCOUNTER — Ambulatory Visit: Payer: 59 | Admitting: Family Medicine

## 2017-03-05 ENCOUNTER — Encounter: Payer: Self-pay | Admitting: Family Medicine

## 2017-03-05 VITALS — BP 130/84 | HR 92 | Temp 97.7°F | Resp 16 | Ht 63.0 in | Wt 181.0 lb

## 2017-03-05 DIAGNOSIS — Z96642 Presence of left artificial hip joint: Secondary | ICD-10-CM

## 2017-03-05 DIAGNOSIS — M1612 Unilateral primary osteoarthritis, left hip: Secondary | ICD-10-CM

## 2017-03-05 DIAGNOSIS — M7062 Trochanteric bursitis, left hip: Secondary | ICD-10-CM | POA: Diagnosis not present

## 2017-03-05 DIAGNOSIS — S76212A Strain of adductor muscle, fascia and tendon of left thigh, initial encounter: Secondary | ICD-10-CM

## 2017-03-05 DIAGNOSIS — Z96649 Presence of unspecified artificial hip joint: Secondary | ICD-10-CM | POA: Insufficient documentation

## 2017-03-05 NOTE — Patient Instructions (Signed)
[facility]: 3:00pm today 03/05/2017 to See Mr. Altamese CabalMaurice Jones, PA-C

## 2017-03-05 NOTE — Progress Notes (Signed)
Name: Olivia Morgan   MRN: [id number]    DOB: 05/07/1955   Date:03/05/2017       Progress Note  Subjective  Chief Complaint  Chief Complaint  Patient presents with  . Leg Pain    left leg pain started 2 weeks ago, last 3 days it has got worse. Pain med from previous surgery do not touch the pain    HPI  Patient presents with LEFT groin and hip pain x4 weeks.  She was seeing PT for plantar fasciitis and was doing inner thigh strengthening exercising and developed significant groin pain.  She stopped these exercises. She tried Mobic, Oxycodone x2 doses and it barely touched the pain. Has also been icing frequently.  None of these interventions have improved her pain significantly.  She is now having a hard time ambulating due to her pain.  Denies weakness, numbness/tingling.  Reports pain is now radiating into her Left calf as well. If she turns a certain way she has a sharp pain in the anterior upper thigh/groin area. 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  No recent travel or long periods of sitting/bed confinement recently.  Patient's hip replacement was done in 2010 by Dr. Deeann SaintHoward Miller, pt prefers not to return to him if possible. Bilateral knees were done [place]tt [place] - unable to see patient until later in the week.  Patient Active Problem List   Diagnosis Date Noted  . Allergic reaction 02/12/2017  . Dermatitis 02/12/2017  . Allergic rhinitis 04/16/2016  . Vitamin D deficiency 11/11/2015  . Preventative health care 11/11/2015  . Osteopenia 11/11/2015  . Mass of left lower leg 09/07/2015  . Adverse drug reaction 06/13/2015  . Overweight (BMI 25.0-29.9) 06/13/2015  . Greater trochanteric bursitis of left hip 06/05/2015  . Bunion of left foot 06/05/2015  . Neuropathy of both feet 06/05/2015  . 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 Thrombocytopenia (HCC) 04/05/2015  . Esophageal reflux 03/19/2015  . Need for  shingles vaccine 03/14/2015  . Lower back pain 03/10/2015  . Dysphagia 03/10/2015  . Herpes simplex virus infection   . OA (osteoarthritis) of hip   . GERD (gastroesophageal reflux disease)   . Plantar fasciitis   . Depression   . Genital herpes   . Bilateral hearing loss     Social History   Tobacco Use  . Smoking status: Never Smoker  . Smokeless tobacco: Never Used  Substance Use Topics  . Alcohol use: Yes    Comment: socially     Current Outpatient Medications:  .  albuterol (PROVENTIL HFA;VENTOLIN HFA) 108 (90 Base) MCG/ACT inhaler, Inhale 2 puffs into the lungs every 4 (four) hours as needed for wheezing or shortness of breath., Disp: 1 Inhaler, Rfl: 1 .  aspirin EC 81 MG tablet, Take 1 tablet (81 mg total) by mouth daily. Take one hour BEFORE any meloxicam or NSAID, Disp: , Rfl:  .  buPROPion (WELLBUTRIN XL) 150 MG 24 hr tablet, TAKE 3 TABLETS BY MOUTH  DAILY (Patient taking differently: TAKE 1 or 2 TABLETS BY MOUTH  DAILY), Disp: 270 tablet, Rfl: 3 .  Cholecalciferol (VITAMIN D-3 PO), Take 1,000 Units by mouth once a week. , Disp: , Rfl:  .  diphenhydrAMINE (BENADRYL) 25 MG tablet, Take 25 mg by mouth every 6 (six) hours as needed., Disp: , Rfl:  .  IRON PO, Take by mouth., Disp: , Rfl:  .  montelukast (SINGULAIR) 10 MG  tablet, Take 1 tablet (10 mg total) by mouth at bedtime., Disp: 30 tablet, Rfl: 3 .  NONFORMULARY OR COMPOUNDED ITEM, Apply 1-2 g topically 4 (four) times daily., Disp: 120 each, Rfl: 2 .  Omega-3 Fatty Acids (FISH OIL PO), Take by mouth as needed. , Disp: , Rfl:  .  RABEprazole (ACIPHEX) 20 MG tablet, TAKE 2 TABLETS BY MOUTH  DAILY AS NEEDED; PROLONGED  USE INCREASES RISK OF  OSTEOPOROSIS, ANEMIA,  PNEUMONIA, Disp: 180 tablet, Rfl: 0 .  SUPER B COMPLEX/C PO, Take daily by mouth., Disp: , Rfl:  .  valACYclovir (VALTREX) 500 MG tablet, Take 1 tablet (500 mg total) by mouth daily., Disp: 90 tablet, Rfl: 3 .  vitamin B-12 (CYANOCOBALAMIN) 1000 MCG tablet, Take  by mouth., Disp: , Rfl:  .  VITAMIN E PO, Take by mouth once a week. , Disp: , Rfl:   Allergies  Allergen Reactions  . Morphine Nausea Only  . Celecoxib Other (See Comments)  . Celexa [Citalopram Hydrobromide]   . Cymbalta [Duloxetine Hcl] Hives  . Gabapentin Other (See Comments)    anger  . Levaquin [Levofloxacin In D5w] Hives  . Valium [Diazepam] Other (See Comments)    hallucinations  . Monistat [Miconazole] Rash  . Prednisone Anxiety    ROS  Constitutional: Negative for fever or weight change.  Respiratory: Negative for cough and shortness of breath.   Cardiovascular: Negative for chest pain or palpitations.  Gastrointestinal: Negative for abdominal pain, no bowel changes.  Musculoskeletal: Antalgic Gait. See HPI Skin: Negative for rash.  Neurological: Negative for dizziness or headache.  No other specific complaints in a complete review of systems (except as listed in HPI above).  Objective  Vitals:   03/05/17 0941  BP: 130/84  Pulse: 92  Resp: 16  Temp: 97.7 F (36.5 C)  TempSrc: Oral  SpO2: 93%  Weight: 181 lb (82.1 kg)  Height: 5\' 3"  (1.6 m)   Body mass index is 32.06 kg/m.  Nursing Note and Vital Signs reviewed.  Physical Exam  Constitutional: Patient appears well-developed and well-nourished. Obese No distress.  HEENT: head atraumatic, normocephalic Cardiovascular: Normal rate, regular rhythm, S1/S2 present.  No murmur or rub heard. No BLE edema. Pulmonary/Chest: Effort normal and breath sounds clear. No respiratory distress or retractions. Psychiatric: Patient has a normal mood and affect. behavior is normal. Judgment and thought content normal Musculoskeletal:  No joint effusions. No gross deformities.  No crepitus. Antalgic Gait favoring the LEFT LE.  Tenderness to the lateral and anterior hip and medial groin area. Limited Left hip AROM and weakness against resistance with significant pain on adduction and moderate pain and weaknss on abduction.    Neurological: he is alert and oriented to person, place, and time. No cranial nerve deficit. Coordination, balance, strength, speech and gait are normal.  Skin: Skin is warm and dry. No rash noted. No erythema.    Recent Results (from the past 2160 hour(s))  Allergy Panel 18, Nut Mix Group     Status: None   Collection Time: 02/13/17 12:00 AM  Result Value Ref Range   Class Description Comment     Comment:     Levels of Specific IgE       Class  Description of Class     ---------------------------  -----  --------------------                    < 0.10         0  Negative            0.10 -    0.31         0/I       Equivocal/Low            0.32 -    0.55         I         Low            0.56 -    1.40         II        Moderate            1.41 -    3.90         III       High            3.91 -   19.00         IV        Very High           19.01 -  100.00         V         Very High                   >100.00         VI        Very High    Sesame Seed IgE <0.10 Class 0 kU/L   Peanut IgE <0.10 Class 0 kU/L   Hazelnut (Filbert) IgE <0.10 Class 0 kU/L   F020-IgE Almond <0.10 Class 0 kU/L   Allergen Coconut IgE <0.10 Class 0 kU/L   Pecan Nut IgE <0.10 Class 0 kU/L   F202-IgE Cashew Nut <0.10 Class 0 kU/L  Allergen Profile, Food-Milk     Status: None   Collection Time: 02/13/17 12:00 AM  Result Value Ref Range   Milk IgE <0.10 Class 0 kU/L   F076-IgE Alpha Lactalbumin <0.10 Class 0 kU/L   F077-IgE Beta Lactoglobulin <0.10 Class 0 kU/L   F078-IgE Casein <0.10 Class 0 kU/L   F081-IgE Cheese, Cheddar Type <0.10 Class 0 kU/L   F082-IgE Cheese, Mold Type <0.10 Class 0 kU/L     Assessment & Plan  1. Groin strain, left, initial encounter - Ambulatory referral to Orthopedic Surgery - meloxicam (MOBIC) 15 MG tablet; Take 1 tablet (15 mg total) by mouth daily. - Historical Documentation only.  2. History of total left hip replacement - Ambulatory referral to Orthopedic  Surgery  3. Primary osteoarthritis of left hip - Ambulatory referral to Orthopedic Surgery  4. Greater trochanteric bursitis of left hip - Ambulatory referral to Orthopedic Surgery  - Advised that because the cause of her ongoing worsening pain, and history of total hip replacement, I recommend she see Ortho as quickly as possible.  She has not responded well to NSAIDS or to Oxycodone. Appointment is made for: 3:00 with Altamese CabalMaurice Jones PA at [facility].  -Red flags and when to present for emergency care or RTC including fever >101.74F, chest pain, shortness of breath, new/worsening/un-resolving symptoms, saddle anesthesia, extremity weakness/swelling/erythema reviewed with patient at time of visit. Follow up and care instructions discussed and provided in AVS.

## 2017-03-06 ENCOUNTER — Ambulatory Visit: Payer: 59 | Admitting: Physical Therapy

## 2017-03-12 ENCOUNTER — Telehealth: Payer: Self-pay

## 2017-03-12 NOTE — Telephone Encounter (Signed)
I recommend she contact the orthopaedist who saw her to either change medicine to continue with further work-up Thank you

## 2017-03-12 NOTE — Telephone Encounter (Signed)
Copied from CRM [id number]. Topic: Inquiry >> Mar 12, 2017  9:55 AM Windy KalataMichael, Taylor L, NT wrote: Reason for CRM: pt states she was seen last week by Irving BurtonEmily, and she was sent to [facility], they put her on muscle relaxer's and tramadol, and she was out of work for a week. Her hip is not feeling better at all. She wants to know what is her next step.    Pt notified that she will need to f/u with ortho

## 2017-03-13 ENCOUNTER — Ambulatory Visit: Payer: 59 | Admitting: Physical Therapy

## 2017-04-25 ENCOUNTER — Encounter: Payer: Self-pay | Admitting: Family Medicine

## 2017-04-25 ENCOUNTER — Ambulatory Visit: Payer: 59 | Admitting: Family Medicine

## 2017-04-25 VITALS — BP 118/80 | HR 106 | Temp 98.0°F | Ht 63.0 in | Wt 181.4 lb

## 2017-04-25 DIAGNOSIS — J069 Acute upper respiratory infection, unspecified: Secondary | ICD-10-CM | POA: Diagnosis not present

## 2017-04-25 DIAGNOSIS — J209 Acute bronchitis, unspecified: Secondary | ICD-10-CM | POA: Diagnosis not present

## 2017-04-25 DIAGNOSIS — S76912A Strain of unspecified muscles, fascia and tendons at thigh level, left thigh, initial encounter: Secondary | ICD-10-CM | POA: Diagnosis not present

## 2017-04-25 MED ORDER — BENZONATATE 100 MG PO CAPS: 100.0000 mg | ORAL_CAPSULE | Freq: Three times a day (TID) | ORAL | 0 refills | Status: DC | PRN

## 2017-04-25 MED ORDER — CLINDAMYCIN PHOSPHATE 1 % EX SOLN: Freq: Two times a day (BID) | CUTANEOUS | 0 refills | Status: DC

## 2017-04-25 NOTE — Patient Instructions (Addendum)
Try saline mist over-the-counter and then gently blow Try plain Mucinex and stay hydrated Use the new medicine for cough if desired Try the antibiotic topically on the face Treat yourself to new make-up  Upper Respiratory Infection, Adult Most upper respiratory infections (URIs) are caused by a virus. A URI affects the nose, throat, and upper air passages. The most common type of URI is often called "the common cold." Follow these instructions at home:  Take medicines only as told by your doctor.  Gargle warm saltwater or take cough drops to comfort your throat as told by your doctor.  Use a warm mist humidifier or inhale steam from a shower to increase air moisture. This may make it easier to breathe.  Drink enough fluid to keep your pee (urine) clear or pale yellow.  Eat soups and other clear broths.  Have a healthy diet.  Rest as needed.  Go back to work when your fever is gone or your doctor says it is okay. ? You may need to stay home longer to avoid giving your URI to others. ? You can also wear a face mask and wash your hands often to prevent spread of the virus.  Use your inhaler more if you have asthma.  Do not use any tobacco products, including cigarettes, chewing tobacco, or electronic cigarettes. If you need help quitting, ask your doctor. Contact a doctor if:  You are getting worse, not better.  Your symptoms are not helped by medicine.  You have chills.  You are getting more short of breath.  You have brown or red mucus.  You have yellow or brown discharge from your nose.  You have pain in your face, especially when you bend forward.  You have a fever.  You have puffy (swollen) neck glands.  You have pain while swallowing.  You have white areas in the back of your throat. Get help right away if:  You have very bad or constant: ? Headache. ? Ear pain. ? Pain in your forehead, behind your eyes, and over your cheekbones (sinus pain). ? Chest  pain.  You have long-lasting (chronic) lung disease and any of the following: ? Wheezing. ? Long-lasting cough. ? Coughing up blood. ? A change in your usual mucus.  You have a stiff neck.  You have changes in your: ? Vision. ? Hearing. ? Thinking. ? Mood. This information is not intended to replace advice given to you by your health care provider. Make sure you discuss any questions you have with your health care provider. Document Released: 09/13/2007 Document Revised: 11/28/2015 Document Reviewed: 07/02/2013 Elsevier Interactive Patient Education  2018 [employer]

## 2017-04-25 NOTE — Progress Notes (Signed)
BP 118/80 (BP Location: Right Arm, Patient Position: Sitting, Cuff Size: Large)   Pulse (!) 106   Temp 98 F (36.7 C) (Oral)   Ht 5\' 3"  (1.6 m)   Wt 181 lb 6.4 oz (82.3 kg)   SpO2 98%   BMI 32.13 kg/m    Subjective:    Patient ID: Olivia Morgan, female    DOB: 08-Aug-1955, 62 y.o.   MRN: [id number]  HPI: Olivia Morgan is a 62 y.o. female  Chief Complaint  Patient presents with  . URI    Pt states that she has slight headache, sore throat off and on, not bad, drinking warm liquids  . Cough    Progressively getting worse, coughing up green mucus today, overall malaise   . Knee Pain    Has had knee replacements, pain is getting worse   . Immunizations    Wants shingles vaccine, explained that we do not have needs to get at pharmacy     HPI  Patient is here for an acute visit with a few complaints  New problem today: She believes she has an upper respiratory infection; she has a sore throat off and on; slight headache Not bad Has tried drinking warm fluids Cough, getting progressively worse; coughing up green mucous today Overall malaise Symptoms started on Friday (today is Wednesday) Remedies: benadryl, cough/cold/flu medicine on Monday night, Tuesday AM and later Tuesday but face started splotching up so she thought it might be interacting with her generic cyclobenzaprine; stopped the cold medicine Not much drainage; just some congestion in her face; feels like goop that does not come out Not using any saline or neti pot Coughing at night; not so bad she can't sleep though No bad body aches, does not think she has the flu Bad headache the first of January, had diarrhea, that resolved  New problem today: She has some pain in the medial LEFT thigh and some tenderness in the middle of the LEFT buttock She has had two partial knee replacements; was taking PT for plantar fasciitis Was working on hip adductors, but then the muscle started to hurt and could hardly walk  (left thigh) Went to [facility]; Dr. Hyacinth MeekerMiller did her hip and she didn't want to see him; she saw a PA instead; they did xrays and didn't see anything; came back to see surgeon; they told her to do PT; took 3 weeks to get an appt; she was doing sideways lunges and then the outside of the knee started to hurt; she decided to stop all that; depending on the day; she sits at work, has a foot rest Right hip flexion causes pain in the left adductor Never bruised, does not think it was torn She wants to get back to see her knee doctor but he was booked out Pain is getting progressively worse  She would like the new shingles vaccine; we do not have this and CMA instructed her to check with her pharmacy  [facility] 2/9 02/12/2017 11/08/2016 11/11/2015 09/07/2015  Decreased Interest 0 0 0 0  Down, Depressed, Hopeless 0 1 0 0  PHQ - 2 Score 0 1 0 0    Relevant past medical, surgical, family and social history reviewed Past Medical History:  Diagnosis Date  . Bilateral hearing loss    wears hearing aids  . Depression   . Genital herpes   . GERD (gastroesophageal reflux disease)   . Herpes simplex virus infection   . Murmur   .  Neuropathy of both feet 06/05/2015  . OA (osteoarthritis) of hip    left  . Plantar fasciitis   . Stomatitis    Past Surgical History:  Procedure Laterality Date  . CESAREAN SECTION    . COLONOSCOPY  2010  . FOOT SURGERY Right   . REPLACEMENT TOTAL KNEE  3/13   Dr[facility]Hallows [facility]  . TOTAL HIP ARTHROPLASTY Left 10/2008  . UPPER GI ENDOSCOPY  1992   Family History  Problem Relation Age of Onset  . Aneurysm Mother   . Stroke Mother   . Heart disease Mother   . Heart disease Father 52  . Hypertension Father   . Lung disease Father   . Heart disease Paternal Grandfather   . Panic disorder Brother   . Milk intolerance Daughter   . Polycystic ovary syndrome Daughter   . Obesity Brother   . Hyperlipidemia Brother   . Hypertension Brother   .  Diabetes Brother   . Cancer Neg Hx   . COPD Neg Hx   . Breast cancer Neg Hx    Social History   Tobacco Use  . Smoking status: Never Smoker  . Smokeless tobacco: Never Used  Substance Use Topics  . Alcohol use: Yes    Comment: socially  . Drug use: No    Interim medical history since last visit reviewed. Allergies and medications reviewed  Review of Systems Per HPI unless specifically indicated above     Objective:    BP 118/80 (BP Location: Right Arm, Patient Position: Sitting, Cuff Size: Large)   Pulse (!) 106   Temp 98 F (36.7 C) (Oral)   Ht 5\' 3"  (1.6 m)   Wt 181 lb 6.4 oz (82.3 kg)   SpO2 98%   BMI 32.13 kg/m   Wt Readings from Last 3 Encounters:  04/25/17 181 lb 6.4 oz (82.3 kg)  03/05/17 181 lb (82.1 kg)  02/12/17 180 lb 6.4 oz (81.8 kg)    Physical Exam  Constitutional: She appears well-developed and well-nourished. No distress.  HENT:  Right Ear: Tympanic membrane and ear canal normal. Decreased hearing is noted.  Left Ear: Tympanic membrane and ear canal normal. Decreased hearing is noted.  Nose: Rhinorrhea present. No mucosal edema.  Mouth/Throat: Oropharynx is clear and moist and mucous membranes are normal. No oropharyngeal exudate or posterior oropharyngeal erythema.  Eyes: EOM are normal. No scleral icterus.  Cardiovascular: Normal rate and regular rhythm.  Pulmonary/Chest: Effort normal and breath sounds normal. No respiratory distress. She has no decreased breath sounds. She has no wheezes.  Musculoskeletal:       Left upper leg: She exhibits no deformity.  Limited ROM and discomfort over the proximal adductors on the LEFT with hip adduction, external rotation, extension  Lymphadenopathy:    She has cervical adenopathy (shoddy).  Neurological: She has normal strength.  Skin: She is not diaphoretic. No pallor.  Psychiatric: She has a normal mood and affect. Her behavior is normal. Her mood appears not anxious.    Results for orders placed or  performed in visit on 02/12/17  Allergy Panel 18, Nut Mix Group  Result Value Ref Range   Class Description Comment    Sesame Seed IgE <0.10 Class 0 kU/L   Peanut IgE <0.10 Class 0 kU/L   Hazelnut (Filbert) IgE <0.10 Class 0 kU/L   F020-IgE Almond <0.10 Class 0 kU/L   Allergen Coconut IgE <0.10 Class 0 kU/L   Pecan Nut IgE <0.10 Class 0 kU/L  F202-IgE Cashew Nut <0.10 Class 0 kU/L  Allergen Profile, Food-Milk  Result Value Ref Range   Milk IgE <0.10 Class 0 kU/L   F076-IgE Alpha Lactalbumin <0.10 Class 0 kU/L   F077-IgE Beta Lactoglobulin <0.10 Class 0 kU/L   F078-IgE Casein <0.10 Class 0 kU/L   F081-IgE Cheese, Cheddar Type <0.10 Class 0 kU/L   F082-IgE Cheese, Mold Type <0.10 Class 0 kU/L      Assessment & Plan:   Problem List Items Addressed This Visit    None    Visit Diagnoses    Viral upper respiratory tract infection    -  Primary   saline rinses, mucinex, tessalon perles; no antibiotics indicated   Acute bronchitis, unspecified organism       explained most likely viral; no need for antibiotics; tessalon perles prescribed   Muscle strain of left thigh, initial encounter       explained suspected dx, strain of the LEFT adductor muscles; doubt tear if no bruising initially; rest, NSAID       Follow up plan: No Follow-up on file.  An after-visit summary was printed and given to the patient at check-out.  Please see the patient instructions which may contain other information and recommendations beyond what is mentioned above in the assessment and plan.  Meds ordered this encounter  Medications  . benzonatate (TESSALON PERLES) 100 MG capsule    Sig: Take 1 capsule (100 mg total) by mouth every 8 (eight) hours as needed for cough.    Dispense:  30 capsule    Refill:  0  . clindamycin (CLEOCIN-T) 1 % external solution    Sig: Apply topically 2 (two) times daily.    Dispense:  30 mL    Refill:  0    No orders of the defined types were placed in this  encounter.

## 2017-05-24 NOTE — Telephone Encounter (Signed)
Closing Rx refill note left open since October 2017 Signing off

## 2017-06-22 ENCOUNTER — Encounter: Payer: Self-pay | Admitting: Family Medicine

## 2017-06-22 ENCOUNTER — Ambulatory Visit: Payer: Managed Care, Other (non HMO) | Admitting: Family Medicine

## 2017-06-22 VITALS — BP 128/76 | HR 97 | Temp 97.6°F | Resp 16 | Ht 63.0 in | Wt 187.7 lb

## 2017-06-22 DIAGNOSIS — S39012A Strain of muscle, fascia and tendon of lower back, initial encounter: Secondary | ICD-10-CM

## 2017-06-22 MED ORDER — MELOXICAM 15 MG PO TABS: 7.5000 mg | ORAL_TABLET | Freq: Every day | ORAL | 0 refills | Status: DC

## 2017-06-22 NOTE — Progress Notes (Deleted)
Name: Olivia Morgan   MRN: [id number]    DOB: 1955-10-27   Date:06/22/2017       Progress Note  Subjective  Chief Complaint  Chief Complaint  Patient presents with  . Back Pain    slipped in bath tub painfor 3 days    HPI  Patient complaining of ***lower back pain radiating*** ongoing for ***. Pain is described as *** and is constant/intermittent*** in nature. Patient has/not*** had these symptoms before.Pain is worse with ***. Patient has tried ***for pain relief. Precipitating factors ***.   ***Patient denies prolonged use of corticosteroids, recent back procedures severe trauma. No personal history of cancer, no fevers or history of IV drug use,. Has not noticed contusion or abrasion. Patient denies neurologic symptoms (eg, weakness, falls or gait instability, numbness or other sensory changes, or bowel/bladder symptoms). Denies constitutional symptoms (eg, unintentional weight loss or night sweats),    No symptoms of new urinary retention, urinary incontinence from bladder overflow, or new fecal incontinence.             Patient Active Problem List   Diagnosis Date Noted  . History of total hip replacement 03/05/2017  . Allergic reaction 02/12/2017  . Dermatitis 02/12/2017  . Allergic rhinitis 04/16/2016  . Presence of right artificial knee joint 12/16/2015  . Obesity (BMI 30.0-34.9) 11/18/2015  . Vitamin D deficiency 11/11/2015  . Preventative health care 11/11/2015  . Osteopenia 11/11/2015  . Mass of left lower leg 09/07/2015  . Adverse drug reaction 06/13/2015  . Overweight (BMI 25.0-29.9) 06/13/2015  . Greater trochanteric bursitis of left hip 06/05/2015  . Hammer toe of right foot 06/05/2015  . Neuropathy of both feet 06/05/2015  . 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 Thrombocytopenia (HCC) 04/05/2015  . Esophageal reflux 03/19/2015  . Need for shingles vaccine 03/14/2015  . Lower back pain 03/10/2015  . Dysphagia 03/10/2015  . Herpes  simplex virus infection   . OA (osteoarthritis) of hip   . GERD (gastroesophageal reflux disease)   . Plantar fasciitis   . Depression   . Genital herpes   . Bilateral hearing loss   . Abnormal ECG 09/09/2014  . Shortness of breath 09/09/2014  . Hip pain 09/09/2012  . Other mechanical complication of other internal orthopedic device, implant, and graft 09/09/2012  . Medial collateral ligament sprain of knee 11/13/2011  . Other and unspecified derangement of medial meniscus 06/13/2011  . Primary localized osteoarthrosis, lower leg 06/13/2011    Social History   Tobacco Use  . Smoking status: Never Smoker  . Smokeless tobacco: Never Used  Substance Use Topics  . Alcohol use: Yes    Comment: socially     Current Outpatient Medications:  .  albuterol (PROVENTIL HFA;VENTOLIN HFA) 108 (90 Base) MCG/ACT inhaler, Inhale 2 puffs into the lungs every 4 (four) hours as needed for wheezing or shortness of breath., Disp: 1 Inhaler, Rfl: 1 .  aspirin EC 81 MG tablet, Take 1 tablet (81 mg total) by mouth daily. Take one hour BEFORE any meloxicam or NSAID, Disp: , Rfl:  .  buPROPion (WELLBUTRIN XL) 150 MG 24 hr tablet, TAKE 3 TABLETS BY MOUTH  DAILY (Patient taking differently: TAKE 1 or 2 TABLETS BY MOUTH  DAILY), Disp: 270 tablet, Rfl: 3 .  Cholecalciferol (VITAMIN D-3 PO), Take 1,000 Units by mouth once a week. , Disp: , Rfl:  .  cyclobenzaprine (FLEXERIL) 10 MG tablet, cyclobenzaprine 10 mg tablet  Take 1 tablet(s) 3 times  a day by oral route., Disp: , Rfl:  .  diphenhydrAMINE (BENADRYL) 25 MG tablet, Take 25 mg by mouth every 6 (six) hours as needed., Disp: , Rfl:  .  IRON PO, Take by mouth., Disp: , Rfl:  .  montelukast (SINGULAIR) 10 MG tablet, Take 1 tablet (10 mg total) by mouth at bedtime., Disp: 30 tablet, Rfl: 3 .  NONFORMULARY OR COMPOUNDED ITEM, Apply 1-2 g topically 4 (four) times daily., Disp: 120 each, Rfl: 2 .  Omega-3 Fatty Acids (FISH OIL PO), Take by mouth as needed. , Disp:  , Rfl:  .  RABEprazole (ACIPHEX) 20 MG tablet, TAKE 2 TABLETS BY MOUTH  DAILY AS NEEDED; PROLONGED  USE INCREASES RISK OF  OSTEOPOROSIS, ANEMIA,  PNEUMONIA, Disp: 180 tablet, Rfl: 0 .  SUPER B COMPLEX/C PO, Take daily by mouth., Disp: , Rfl:  .  traMADol (ULTRAM) 50 MG tablet, tramadol 50 mg tablet  Take 1 tablet every 6 hours by oral route as needed., Disp: , Rfl:  .  valACYclovir (VALTREX) 500 MG tablet, Take 1 tablet (500 mg total) by mouth daily., Disp: 90 tablet, Rfl: 3 .  vitamin B-12 (CYANOCOBALAMIN) 1000 MCG tablet, Take 1,000 mcg by mouth daily. , Disp: , Rfl:  .  VITAMIN E PO, Take by mouth once a week. , Disp: , Rfl:  .  benzonatate (TESSALON PERLES) 100 MG capsule, Take 1 capsule (100 mg total) by mouth every 8 (eight) hours as needed for cough. (Patient not taking: Reported on 06/22/2017), Disp: 30 capsule, Rfl: 0 .  clindamycin (CLEOCIN-T) 1 % external solution, Apply topically 2 (two) times daily. (Patient not taking: Reported on 06/22/2017), Disp: 30 mL, Rfl: 0 .  naproxen (NAPROSYN) 500 MG tablet, naproxen 500 mg tablet  Take 1 tablet twice a day by oral route., Disp: , Rfl:   Allergies  Allergen Reactions  . Levofloxacin Hives  . Morphine Nausea Only  . Celecoxib Other (See Comments)  . Celexa [Citalopram Hydrobromide]   . Cymbalta [Duloxetine Hcl] Hives  . Gabapentin Other (See Comments)    anger  . Levaquin [Levofloxacin In D5w] Hives  . Valium [Diazepam] Other (See Comments)    hallucinations  . Monistat [Miconazole] Rash  . Prednisone Anxiety    ROS  *** Constitutional: Negative for fever or weight change.  Respiratory: Negative for cough and shortness of breath.   Cardiovascular: Negative for chest pain or palpitations.  Gastrointestinal: Negative for abdominal pain, no bowel changes.  Musculoskeletal: Negative for gait problem or joint swelling.  Skin: Negative for rash.  Neurological: Negative for dizziness or headache.  No other specific complaints in a  complete review of systems (except as listed in HPI above).  Objective  Vitals:   06/22/17 1347  BP: 128/76  Pulse: 97  Resp: 16  Temp: 97.6 F (36.4 C)  TempSrc: Oral  SpO2: 94%  Weight: 187 lb 11.2 oz (85.1 kg)  Height: 5\' 3"  (1.6 m)   ***  Body mass index is 33.25 kg/m.  Nursing Note and Vital Signs reviewed.  Physical Exam  *** Constitutional: Patient appears well-developed and well-nourished. Obese *** No distress.  HEENT: head atraumatic, normocephalic, pupils equal and reactive to light, EOM's intact, TM's without erythema or bulging, ***, no maxillary or frontal sinus tenderness , neck supple without lymphadenopathy, oropharynx pink and moist without exudate Cardiovascular: Normal rate, regular rhythm, S1/S2 present.  No murmur or rub heard. No BLE edema. Pulmonary/Chest: Effort normal and breath sounds clear. No respiratory  distress or retractions. Abdominal: Soft and non-tender, bowel sounds present x4 quadrants.  No CVA Tenderness *** Psychiatric: Patient has a normal mood and affect. behavior is normal. Judgment and thought content normal.  No results found for this or any previous visit (from the past 72 hour(s)).  Assessment & Plan  There are no diagnoses linked to this encounter.   -Red flags and when to present for emergency care or RTC including fever >101.37F, chest pain, shortness of breath, new/worsening/un-resolving symptoms, *** reviewed with patient at time of visit. Follow up and care instructions discussed and provided in AVS. -Reviewed Health Maintenance: ***

## 2017-06-22 NOTE — Progress Notes (Signed)
Name: Olivia Morgan   MRN: [id number]    DOB: 01-21-1956   Date:06/22/2017       Progress Note  Subjective  Chief Complaint  Chief Complaint  Patient presents with  . Back Pain    slipped in bath tub painfor 3 days    HPI  Patient complaining of midline lower back pain radiating to left back ongoing for 6 days. Pain is described as burning and is constant in nature. Patient has had these symptoms before but not in the same location- sts feels the same as when she had a pulled muscle in her leg.Pain is worse with movement. Patient has tried flexeril, mobic and tramadol at night for pain relief. Pt sts heat help when it is on.   - Precipitating factors- slipped in shower on Sunday; did not fall but noticed back pain afterwards. Patient sits a lot at her job- and this week has been in work classes all day making pain worse. Pain has been interfering with sleep the first night- but then started taking leftover tramadol- from Partial Knee replacement.   - Of note pt dx osteopenia L1-L4 in 2010. - Patient denies prolonged use of corticosteroids, recent back procedures, severe trauma. No personal history of cancer, no fevers or history of IV drug use. Has not noticed contusion or abrasion. Patient denies neurologic symptoms-weakness, falls or gait instability, numbness or other sensory changes, or bowel/bladder symptoms. Denies constitutional symptoms- unintentional weight loss or night sweats.   Patient Active Problem List   Diagnosis Date Noted  . History of total hip replacement 03/05/2017  . Allergic reaction 02/12/2017  . Dermatitis 02/12/2017  . Allergic rhinitis 04/16/2016  . Presence of right artificial knee joint 12/16/2015  . Obesity (BMI 30.0-34.9) 11/18/2015  . Vitamin D deficiency 11/11/2015  . Preventative health care 11/11/2015  . Osteopenia 11/11/2015  . Mass of left lower leg 09/07/2015  . Adverse drug reaction 06/13/2015  . Overweight (BMI 25.0-29.9) 06/13/2015  .  Greater trochanteric bursitis of left hip 06/05/2015  . Hammer toe of right foot 06/05/2015  . Neuropathy of both feet 06/05/2015  . 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 Thrombocytopenia (HCC) 04/05/2015  . Esophageal reflux 03/19/2015  . Need for shingles vaccine 03/14/2015  . Lower back pain 03/10/2015  . Dysphagia 03/10/2015  . Herpes simplex virus infection   . OA (osteoarthritis) of hip   . GERD (gastroesophageal reflux disease)   . Plantar fasciitis   . Depression   . Genital herpes   . Bilateral hearing loss   . Abnormal ECG 09/09/2014  . Shortness of breath 09/09/2014  . Hip pain 09/09/2012  . Other mechanical complication of other internal orthopedic device, implant, and graft 09/09/2012  . Medial collateral ligament sprain of knee 11/13/2011  . Other and unspecified derangement of medial meniscus 06/13/2011  . Primary localized osteoarthrosis, lower leg 06/13/2011    Social History   Tobacco Use  . Smoking status: Never Smoker  . Smokeless tobacco: Never Used  Substance Use Topics  . Alcohol use: Yes    Comment: socially     Current Outpatient Medications:  .  albuterol (PROVENTIL HFA;VENTOLIN HFA) 108 (90 Base) MCG/ACT inhaler, Inhale 2 puffs into the lungs every 4 (four) hours as needed for wheezing or shortness of breath., Disp: 1 Inhaler, Rfl: 1 .  aspirin EC 81 MG tablet, Take 1 tablet (81 mg total) by mouth daily. Take one hour BEFORE any meloxicam or NSAID, Disp: ,  Rfl:  .  buPROPion (WELLBUTRIN XL) 150 MG 24 hr tablet, TAKE 3 TABLETS BY MOUTH  DAILY (Patient taking differently: TAKE 1 or 2 TABLETS BY MOUTH  DAILY), Disp: 270 tablet, Rfl: 3 .  Cholecalciferol (VITAMIN D-3 PO), Take 1,000 Units by mouth once a week. , Disp: , Rfl:  .  cyclobenzaprine (FLEXERIL) 10 MG tablet, cyclobenzaprine 10 mg tablet  Take 1 tablet(s) 3 times a day by oral route., Disp: , Rfl:  .  diphenhydrAMINE (BENADRYL) 25 MG tablet, Take 25 mg by mouth every 6 (six)  hours as needed., Disp: , Rfl:  .  IRON PO, Take by mouth., Disp: , Rfl:  .  montelukast (SINGULAIR) 10 MG tablet, Take 1 tablet (10 mg total) by mouth at bedtime., Disp: 30 tablet, Rfl: 3 .  NONFORMULARY OR COMPOUNDED ITEM, Apply 1-2 g topically 4 (four) times daily., Disp: 120 each, Rfl: 2 .  Omega-3 Fatty Acids (FISH OIL PO), Take by mouth as needed. , Disp: , Rfl:  .  RABEprazole (ACIPHEX) 20 MG tablet, TAKE 2 TABLETS BY MOUTH  DAILY AS NEEDED; PROLONGED  USE INCREASES RISK OF  OSTEOPOROSIS, ANEMIA,  PNEUMONIA, Disp: 180 tablet, Rfl: 0 .  SUPER B COMPLEX/C PO, Take daily by mouth., Disp: , Rfl:  .  traMADol (ULTRAM) 50 MG tablet, tramadol 50 mg tablet  Take 1 tablet every 6 hours by oral route as needed., Disp: , Rfl:  .  valACYclovir (VALTREX) 500 MG tablet, Take 1 tablet (500 mg total) by mouth daily., Disp: 90 tablet, Rfl: 3 .  vitamin B-12 (CYANOCOBALAMIN) 1000 MCG tablet, Take 1,000 mcg by mouth daily. , Disp: , Rfl:  .  VITAMIN E PO, Take by mouth once a week. , Disp: , Rfl:  .  benzonatate (TESSALON PERLES) 100 MG capsule, Take 1 capsule (100 mg total) by mouth every 8 (eight) hours as needed for cough. (Patient not taking: Reported on 06/22/2017), Disp: 30 capsule, Rfl: 0 .  clindamycin (CLEOCIN-T) 1 % external solution, Apply topically 2 (two) times daily. (Patient not taking: Reported on 06/22/2017), Disp: 30 mL, Rfl: 0 .  naproxen (NAPROSYN) 500 MG tablet, naproxen 500 mg tablet  Take 1 tablet twice a day by oral route., Disp: , Rfl:   Allergies  Allergen Reactions  . Levofloxacin Hives  . Morphine Nausea Only  . Celecoxib Other (See Comments)  . Celexa [Citalopram Hydrobromide]   . Cymbalta [Duloxetine Hcl] Hives  . Gabapentin Other (See Comments)    anger  . Levaquin [Levofloxacin In D5w] Hives  . Valium [Diazepam] Other (See Comments)    hallucinations  . Monistat [Miconazole] Rash  . Prednisone Anxiety    ROS   Constitutional: Negative for fever or weight change.   Respiratory: Negative for cough and shortness of breath.   Cardiovascular: Negative for chest pain or palpitations.  Gastrointestinal: Negative for abdominal pain, no bowel changes.  Musculoskeletal: Positive back pain, Negative for gait problem or joint swelling.  Skin: Negative for rash.  Neurological: Negative for dizziness or headache.  No other specific complaints in a complete review of systems (except as listed in HPI above).  Objective  Vitals:   06/22/17 1347  BP: 128/76  Pulse: 97  Resp: 16  Temp: 97.6 F (36.4 C)  TempSrc: Oral  SpO2: 94%  Weight: 187 lb 11.2 oz (85.1 kg)  Height: 5\' 3"  (1.6 m)     Body mass index is 33.25 kg/m.  Nursing Note and Vital Signs reviewed.  Physical Exam   Constitutional: Patient appears well-developed and well-nourished. Overweight.  No distress.  Cardiovascular: Normal rate, regular rhythm, S1/S2 present.  No murmur or rub heard. No BLE edema. Pulmonary/Chest: Effort normal and breath sounds clear. No respiratory distress or retractions. Abdominal: Soft and non-tender, bowel sounds present x4 quadrants.  No CVA Tenderness  MSK: Lumbar musculature tenderness noted, worse with bending; no bony/spinal tenderness, crepitus, or step-offs. Full ROM. Negative Straight leg test.  Skin: No rash, abrasion, redness or bruising noted.  Psychiatric: Patient has a normal mood and affect. behavior is normal. Judgment and thought content normal.  No results found for this or any previous visit (from the past 72 hour(s)).  Assessment & Plan  1. Strain of lumbar region, initial encounter - Take mobic and flexeril daily PRN and additional tylenol for pain relief -  Alternate between ice and heat for 20 minutes at a time 4 times a day - Rest and drink lots of water - If you pain improves in a few days start doing some of your back exercises slowly  - If your pain is not improving or worsening, come back in - if you lose bowel/bladder function,  have severe pain go to the ER - meloxicam (MOBIC) 15 MG tablet; Take 0.5-1 tablets (7.5-15 mg total) by mouth daily.  Dispense: 20 tablet; Refill: 0  -Red flags and when to present for emergency care or RTC including fever >101.61F, chest pain, shortness of breath, new/worsening/un-resolving symptoms, numbness/tingling, extremity weakness,  reviewed with patient at time of visit. Follow up and care instructions discussed and provided in AVS.

## 2017-09-22 ENCOUNTER — Other Ambulatory Visit: Payer: Self-pay | Admitting: Family Medicine

## 2017-09-23 ENCOUNTER — Other Ambulatory Visit: Payer: Self-pay | Admitting: Family Medicine

## 2017-09-24 ENCOUNTER — Other Ambulatory Visit: Payer: Self-pay | Admitting: Family Medicine

## 2017-11-12 ENCOUNTER — Other Ambulatory Visit: Payer: Self-pay | Admitting: Family Medicine

## 2017-11-15 ENCOUNTER — Other Ambulatory Visit: Payer: Self-pay | Admitting: Family Medicine

## 2017-11-15 DIAGNOSIS — Z1231 Encounter for screening mammogram for malignant neoplasm of breast: Secondary | ICD-10-CM

## 2017-11-22 ENCOUNTER — Ambulatory Visit (INDEPENDENT_AMBULATORY_CARE_PROVIDER_SITE_OTHER): Payer: Managed Care, Other (non HMO) | Admitting: Family Medicine

## 2017-11-22 ENCOUNTER — Other Ambulatory Visit: Payer: Self-pay

## 2017-11-22 ENCOUNTER — Encounter: Payer: Self-pay | Admitting: Family Medicine

## 2017-11-22 VITALS — BP 116/74 | HR 60 | Temp 98.3°F | Ht 64.0 in | Wt 176.5 lb

## 2017-11-22 DIAGNOSIS — Z0000 Encounter for general adult medical examination without abnormal findings: Secondary | ICD-10-CM | POA: Diagnosis not present

## 2017-11-22 DIAGNOSIS — E669 Obesity, unspecified: Secondary | ICD-10-CM | POA: Diagnosis not present

## 2017-11-22 DIAGNOSIS — E2839 Other primary ovarian failure: Secondary | ICD-10-CM

## 2017-11-22 DIAGNOSIS — Z789 Other specified health status: Secondary | ICD-10-CM | POA: Diagnosis not present

## 2017-11-22 NOTE — Patient Instructions (Addendum)
Please do call to schedule your bone density study; the number to schedule one at either [facility] or [facility] is [phone] or [phone]  Check out the information at [email or web address] entitled "Nutrition for Weight Loss: What You Need to Know about Fad Diets" Try to lose between 1-2 pounds per week by taking in fewer calories and burning off more calories You can succeed by limiting portions, limiting foods dense in calories and fat, becoming more active, and drinking 8 glasses of water a day (64 ounces) Don't skip meals, especially breakfast, as skipping meals may alter your metabolism Do not use over-the-counter weight loss pills or gimmicks that claim rapid weight loss A healthy BMI (or body mass index) is between 18.5 and 24.9 You can calculate your ideal BMI at the [place] website [email or web address].fr  Health Maintenance, Female Adopting a healthy lifestyle and getting preventive care can go a long way to promote health and wellness. Talk with your health care provider about what schedule of regular examinations is right for you. This is a good chance for you to check in with your provider about disease prevention and staying healthy. In between checkups, there are plenty of things you can do on your own. Experts have done a lot of research about which lifestyle changes and preventive measures are most likely to keep you healthy. Ask your health care provider for more information. Weight and diet Eat a healthy diet  Be sure to include plenty of vegetables, fruits, low-fat dairy products, and lean protein.  Do not eat a lot of foods high in solid fats, added sugars, or salt.  Get regular exercise. This is one of the most important things you can do for your health. ? Most adults should exercise for at least 150 minutes each week. The exercise should increase your heart rate and make you sweat  (moderate-intensity exercise). ? Most adults should also do strengthening exercises at least twice a week. This is in addition to the moderate-intensity exercise.  Maintain a healthy weight  Body mass index (BMI) is a measurement that can be used to identify possible weight problems. It estimates body fat based on height and weight. Your health care provider can help determine your BMI and help you achieve or maintain a healthy weight.  For females 69 years of age and older: ? A BMI below 18.5 is considered underweight. ? A BMI of 18.5 to 24.9 is normal. ? A BMI of 25 to 29.9 is considered overweight. ? A BMI of 30 and above is considered obese.  Watch levels of cholesterol and blood lipids  You should start having your blood tested for lipids and cholesterol at 63 years of age, then have this test every 5 years.  You may need to have your cholesterol levels checked more often if: ? Your lipid or cholesterol levels are high. ? You are older than 63 years of age. ? You are at high risk for heart disease.  Cancer screening Lung Cancer  Lung cancer screening is recommended for adults 55-59 years old who are at high risk for lung cancer because of a history of smoking.  A yearly low-dose CT scan of the lungs is recommended for people who: ? Currently smoke. ? Have quit within the past 15 years. ? Have at least a 30-pack-year history of smoking. A pack year is smoking an average of one pack of cigarettes a day for 1 year.  Yearly screening should continue until it has  been 15 years since you quit.  Yearly screening should stop if you develop a health problem that would prevent you from having lung cancer treatment.  Breast Cancer  Practice breast self-awareness. This means understanding how your breasts normally appear and feel.  It also means doing regular breast self-exams. Let your health care provider know about any changes, no matter how small.  If you are in your 20s or  30s, you should have a clinical breast exam (CBE) by a health care provider every 1-3 years as part of a regular health exam.  If you are 38 or older, have a CBE every year. Also consider having a breast X-ray (mammogram) every year.  If you have a family history of breast cancer, talk to your health care provider about genetic screening.  If you are at high risk for breast cancer, talk to your health care provider about having an MRI and a mammogram every year.  Breast cancer gene (BRCA) assessment is recommended for women who have family members with BRCA-related cancers. BRCA-related cancers include: ? Breast. ? Ovarian. ? Tubal. ? Peritoneal cancers.  Results of the assessment will determine the need for genetic counseling and BRCA1 and BRCA2 testing.  Cervical Cancer Your health care provider may recommend that you be screened regularly for cancer of the pelvic organs (ovaries, uterus, and vagina). This screening involves a pelvic examination, including checking for microscopic changes to the surface of your cervix (Pap test). You may be encouraged to have this screening done every 3 years, beginning at age 67.  For women ages 45-65, health care providers may recommend pelvic exams and Pap testing every 3 years, or they may recommend the Pap and pelvic exam, combined with testing for human papilloma virus (HPV), every 5 years. Some types of HPV increase your risk of cervical cancer. Testing for HPV may also be done on women of any age with unclear Pap test results.  Other health care providers may not recommend any screening for nonpregnant women who are considered low risk for pelvic cancer and who do not have symptoms. Ask your health care provider if a screening pelvic exam is right for you.  If you have had past treatment for cervical cancer or a condition that could lead to cancer, you need Pap tests and screening for cancer for at least 20 years after your treatment. If Pap tests  have been discontinued, your risk factors (such as having a new sexual partner) need to be reassessed to determine if screening should resume. Some women have medical problems that increase the chance of getting cervical cancer. In these cases, your health care provider may recommend more frequent screening and Pap tests.  Colorectal Cancer  This type of cancer can be detected and often prevented.  Routine colorectal cancer screening usually begins at 63 years of age and continues through 63 years of age.  Your health care provider may recommend screening at an earlier age if you have risk factors for colon cancer.  Your health care provider may also recommend using home test kits to check for hidden blood in the stool.  A small camera at the end of a tube can be used to examine your colon directly (sigmoidoscopy or colonoscopy). This is done to check for the earliest forms of colorectal cancer.  Routine screening usually begins at age 42.  Direct examination of the colon should be repeated every 5-10 years through 63 years of age. However, you may need to be screened  more often if early forms of precancerous polyps or small growths are found.  Skin Cancer  Check your skin from head to toe regularly.  Tell your health care provider about any new moles or changes in moles, especially if there is a change in a mole's shape or color.  Also tell your health care provider if you have a mole that is larger than the size of a pencil eraser.  Always use sunscreen. Apply sunscreen liberally and repeatedly throughout the day.  Protect yourself by wearing long sleeves, pants, a wide-brimmed hat, and sunglasses whenever you are outside.  Heart disease, diabetes, and high blood pressure  High blood pressure causes heart disease and increases the risk of stroke. High blood pressure is more likely to develop in: ? People who have blood pressure in the high end of the normal range (130-139/85-89 mm  Hg). ? People who are overweight or obese. ? People who are African American.  If you are 76-73 years of age, have your blood pressure checked every 3-5 years. If you are 31 years of age or older, have your blood pressure checked every year. You should have your blood pressure measured twice-once when you are at a hospital or clinic, and once when you are not at a hospital or clinic. Record the average of the two measurements. To check your blood pressure when you are not at a hospital or clinic, you can use: ? An automated blood pressure machine at a pharmacy. ? A home blood pressure monitor.  If you are between 81 years and 18 years old, ask your health care provider if you should take aspirin to prevent strokes.  Have regular diabetes screenings. This involves taking a blood sample to check your fasting blood sugar level. ? If you are at a normal weight and have a low risk for diabetes, have this test once every three years after 63 years of age. ? If you are overweight and have a high risk for diabetes, consider being tested at a younger age or more often. Preventing infection Hepatitis B  If you have a higher risk for hepatitis B, you should be screened for this virus. You are considered at high risk for hepatitis B if: ? You were born in a country where hepatitis B is common. Ask your health care provider which countries are considered high risk. ? Your parents were born in a high-risk country, and you have not been immunized against hepatitis B (hepatitis B vaccine). ? You have HIV or AIDS. ? You use needles to inject street drugs. ? You live with someone who has hepatitis B. ? You have had sex with someone who has hepatitis B. ? You get hemodialysis treatment. ? You take certain medicines for conditions, including cancer, organ transplantation, and autoimmune conditions.  Hepatitis C  Blood testing is recommended for: ? Everyone born from [birth year] through 1966. ? Anyone with known  risk factors for hepatitis C.  Sexually transmitted infections (STIs)  You should be screened for sexually transmitted infections (STIs) including gonorrhea and chlamydia if: ? You are sexually active and are younger than 62 years of age. ? You are older than 63 years of age and your health care provider tells you that you are at risk for this type of infection. ? Your sexual activity has changed since you were last screened and you are at an increased risk for chlamydia or gonorrhea. Ask your health care provider if you are at risk.  If you  do not have HIV, but are at risk, it may be recommended that you take a prescription medicine daily to prevent HIV infection. This is called pre-exposure prophylaxis (PrEP). You are considered at risk if: ? You are sexually active and do not regularly use condoms or know the HIV status of your partner(s). ? You take drugs by injection. ? You are sexually active with a partner who has HIV.  Talk with your health care provider about whether you are at high risk of being infected with HIV. If you choose to begin PrEP, you should first be tested for HIV. You should then be tested every 3 months for as long as you are taking PrEP. Pregnancy  If you are premenopausal and you may become pregnant, ask your health care provider about preconception counseling.  If you may become pregnant, take 400 to 800 micrograms (mcg) of folic acid every day.  If you want to prevent pregnancy, talk to your health care provider about birth control (contraception). Osteoporosis and menopause  Osteoporosis is a disease in which the bones lose minerals and strength with aging. This can result in serious bone fractures. Your risk for osteoporosis can be identified using a bone density scan.  If you are 55 years of age or older, or if you are at risk for osteoporosis and fractures, ask your health care provider if you should be screened.  Ask your health care provider whether you  should take a calcium or vitamin D supplement to lower your risk for osteoporosis.  Menopause may have certain physical symptoms and risks.  Hormone replacement therapy may reduce some of these symptoms and risks. Talk to your health care provider about whether hormone replacement therapy is right for you. Follow these instructions at home:  Schedule regular health, dental, and eye exams.  Stay current with your immunizations.  Do not use any tobacco products including cigarettes, chewing tobacco, or electronic cigarettes.  If you are pregnant, do not drink alcohol.  If you are breastfeeding, limit how much and how often you drink alcohol.  Limit alcohol intake to no more than 1 drink per day for nonpregnant women. One drink equals 12 ounces of beer, 5 ounces of wine, or 1 ounces of hard liquor.  Do not use street drugs.  Do not share needles.  Ask your health care provider for help if you need support or information about quitting drugs.  Tell your health care provider if you often feel depressed.  Tell your health care provider if you have ever been abused or do not feel safe at home. This information is not intended to replace advice given to you by your health care provider. Make sure you discuss any questions you have with your health care provider. Document Released: 10/10/2010 Document Revised: 09/02/2015 Document Reviewed: 12/29/2014 Elsevier Interactive Patient Education  2018 [employer].  Preventing Unhealthy [employer], Adult Staying at a healthy weight is important. When fat builds up in your body, you may become overweight or obese. These conditions put you at greater risk for developing certain health problems, such as heart disease, diabetes, sleeping problems, joint problems, and some cancers. Unhealthy weight gain is often the result of making unhealthy choices in what you eat. It is also a result of not getting enough exercise. You can make changes to your  lifestyle to prevent obesity and stay as healthy as possible. What nutrition changes can be made? To maintain a healthy weight and prevent obesity:  Eat only as  much as your body needs. To do this: ? Pay attention to signs that you are hungry or full. Stop eating as soon as you feel full. ? If you feel hungry, try drinking water first. Drink enough water so your urine is clear or pale yellow. ? Eat smaller portions. ? Look at serving sizes on food labels. Most foods contain more than one serving per container. ? Eat the recommended amount of calories for your gender and activity level. While most active people should eat around 2,000 calories per day, if you are trying to lose weight or are not very active, you main need to eat less calories. Talk to your health care provider or dietitian about how many calories you should eat each day.  Choose healthy foods, such as: ? Fruits and vegetables. Try to fill at least half of your plate at each meal with fruits and vegetables. ? Whole grains, such as whole wheat bread, brown rice, and quinoa. ? Lean meats, such as chicken or fish. ? Other healthy proteins, such as beans, eggs, or tofu. ? Healthy fats, such as nuts, seeds, fatty fish, and olive oil. ? Low-fat or fat-free dairy.  Check food labels and avoid food and drinks that: ? Are high in calories. ? Have added sugar. ? Are high in sodium. ? Have saturated fats or trans fats.  Limit how much you eat of the following foods: ? Prepackaged meals. ? Fast food. ? Fried foods. ? Processed meat, such as bacon, sausage, and deli meats. ? Fatty cuts of red meat and poultry with skin.  Cook foods in healthier ways, such as by baking, broiling, or grilling.  When grocery shopping, try to shop around the outside of the store. This helps you buy mostly fresh foods and avoid canned and prepackaged foods.  What lifestyle changes can be made?  Exercise at least 30 minutes 5 or more days each week.  Exercising includes brisk walking, yard work, biking, running, swimming, and team sports like basketball and soccer. Ask your health care provider which exercises are safe for you.  Do not use any products that contain nicotine or tobacco, such as cigarettes and e-cigarettes. If you need help quitting, ask your health care provider.  Limit alcohol intake to no more than 1 drink a day for nonpregnant women and 2 drinks a day for men. One drink equals 12 oz of beer, 5 oz of wine, or 1 oz of hard liquor.  Try to get 7-9 hours of sleep each night. What other changes can be made?  Keep a food and activity journal to keep track of: ? What you ate and how many calories you had. Remember to count sauces, dressings, and side dishes. ? Whether you were active, and what exercises you did. ? Your calorie, weight, and activity goals.  Check your weight regularly. Track any changes. If you notice you have gained weight, make changes to your diet or activity routine.  Avoid taking weight-loss medicines or supplements. Talk to your health care provider before starting any new medicine or supplement.  Talk to your health care provider before trying any new diet or exercise plan. Why are these changes important? Eating healthy, staying active, and having healthy habits not only help prevent obesity, they also:  Help you to manage stress and emotions.  Help you to connect with friends and family.  Improve your self-esteem.  Improve your sleep.  Prevent long-term health problems.  What can happen if changes are not  made? Being obese or overweight can cause you to develop joint or bone problems, which can make it hard for you to stay active or do activities you enjoy. Being obese or overweight also puts stress on your heart and lungs and can lead to health problems like diabetes, heart disease, and some cancers. Where to find more information: Talk with your health care provider or a dietitian about  healthy eating and healthy lifestyle choices. You may also find other information through these resources:  [place] Department of Agriculture MyPlate: FormerBoss.no  American Heart Association: www.heart.org  Centers for Disease Control and Prevention: http://www.wolf.info/  Summary  Staying at a healthy weight is important. It helps prevent certain diseases and health problems, such as heart disease, diabetes, joint problems, sleep disorders, and some cancers.  Being obese or overweight can cause you to develop joint or bone problems, which can make it hard for you to stay active or do activities you enjoy.  You can prevent unhealthy weight gain by eating a healthy diet, exercising regularly, not smoking, limiting alcohol, and getting enough sleep.  Talk with your health care provider or a dietitian for guidance about healthy eating and healthy lifestyle choices. This information is not intended to replace advice given to you by your health care provider. Make sure you discuss any questions you have with your health care provider. Document Released: 03/28/2016 Document Revised: 05/03/2016 Document Reviewed: 05/03/2016 Elsevier Interactive Patient Education  [employer].

## 2017-11-22 NOTE — Assessment & Plan Note (Signed)
USPSTF grade A and B recommendations reviewed with patient; age-appropriate recommendations, preventive care, screening tests, etc discussed and encouraged; healthy living encouraged; see AVS for patient education given to patient  
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
## 2017-11-22 NOTE — Assessment & Plan Note (Signed)
Praise given for weight loss efforts; she is almost out of the obese range, a little further to go

## 2017-11-23 LAB — MEASLES/MUMPS/RUBELLA IMMUNITY
MUMPS ABS, IGG: 176 AU/mL (ref >10.9)
RUBELLA: 18.6 {index} (ref >0.99)

## 2017-11-23 LAB — CBC WITH DIFFERENTIAL/PLATELET
BASOS ABS: 0 10*3/uL (ref 0.0–0.2)
Basos: 0 %
EOS (ABSOLUTE): 0.1 10*3/uL (ref 0.0–0.4)
Eos: 3 %
Hematocrit: 44.8 % (ref 34.0–46.6)
Hemoglobin: 15.4 g/dL (ref 11.1–15.9)
IMMATURE GRANULOCYTES: 0 %
Immature Grans (Abs): 0 10*3/uL (ref 0.0–0.1)
LYMPHS ABS: 0.9 10*3/uL (ref 0.7–3.1)
Lymphs: 19 %
MCH: 32.7 pg (ref 26.6–33.0)
MCHC: 34.4 g/dL (ref 31.5–35.7)
MCV: 95 fL (ref 79–97)
MONOS ABS: 0.4 10*3/uL (ref 0.1–0.9)
Monocytes: 8 %
NEUTROS PCT: 70 %
Neutrophils Absolute: 3.2 10*3/uL (ref 1.4–7.0)
Platelets: 148 10*3/uL — ABNORMAL LOW (ref 150–450)
RBC: 4.71 x10E6/uL (ref 3.77–5.28)
RDW: 13.7 % (ref 12.3–15.4)
WBC: 4.7 10*3/uL (ref 3.4–10.8)

## 2017-11-23 LAB — HEPATIC FUNCTION PANEL
ALK PHOS: 79 IU/L (ref 39–117)
ALT: 22 IU/L (ref 0–32)
AST: 19 IU/L (ref 0–40)
Albumin: 4.7 g/dL (ref 3.6–4.8)
BILIRUBIN, DIRECT: 0.16 mg/dL (ref 0.00–0.40)
Bilirubin Total: 0.6 mg/dL (ref 0.0–1.2)
TOTAL PROTEIN: 6.7 g/dL (ref 6.0–8.5)

## 2017-11-23 LAB — LP+CREAT+HB A1C
CHOL/HDL RATIO: 2.2 ratio (ref 0.0–4.4)
Cholesterol, Total: 153 mg/dL (ref 100–199)
Creatinine, Ser: 0.97 mg/dL (ref 0.57–1.00)
GFR calc Af Amer: 72 mL/min/{1.73_m2} (ref >59)
GFR, EST NON AFRICAN AMERICAN: 62 mL/min/{1.73_m2} (ref >59)
HDL: 71 mg/dL (ref >39)
HEMOGLOBIN A1C: 5.4 % (ref 4.8–5.6)
LDL CALC: 68 mg/dL (ref 0–99)
TRIGLYCERIDES: 70 mg/dL (ref 0–149)
VLDL CHOLESTEROL CAL: 14 mg/dL (ref 5–40)

## 2017-11-23 LAB — GLUCOSE, RANDOM: GLUCOSE: 86 mg/dL (ref 65–99)

## 2017-11-23 MED ORDER — RABEPRAZOLE SODIUM 20 MG PO TBEC: 20.0000 mg | DELAYED_RELEASE_TABLET | Freq: Every day | ORAL | 0 refills | Status: DC

## 2017-11-23 MED ORDER — RABEPRAZOLE SODIUM 20 MG PO TBEC: 20.0000 mg | DELAYED_RELEASE_TABLET | Freq: Every day | ORAL | 0 refills | Status: DC | PRN

## 2017-11-23 NOTE — Telephone Encounter (Signed)
Patient has used 180 pills of pantoprazole since 2018 I'm changing the Rx to reflect how she is most likely taking it

## 2017-11-30 ENCOUNTER — Ambulatory Visit
Admission: RE | Admit: 2017-11-30 | Discharge: 2017-11-30 | Disposition: A | Discharge status: Home / Self Care | Payer: Managed Care, Other (non HMO) | Source: Ambulatory Visit | Attending: Family Medicine | Admitting: Family Medicine

## 2017-11-30 DIAGNOSIS — Z1231 Encounter for screening mammogram for malignant neoplasm of breast: Secondary | ICD-10-CM | POA: Insufficient documentation

## 2017-12-05 ENCOUNTER — Ambulatory Visit
Admission: RE | Admit: 2017-12-05 | Discharge: 2017-12-05 | Disposition: A | Discharge status: Home / Self Care | Payer: Managed Care, Other (non HMO) | Source: Ambulatory Visit | Attending: Family Medicine | Admitting: Family Medicine

## 2017-12-05 DIAGNOSIS — E2839 Other primary ovarian failure: Secondary | ICD-10-CM

## 2017-12-06 ENCOUNTER — Other Ambulatory Visit: Payer: Self-pay | Admitting: Family Medicine

## 2017-12-06 ENCOUNTER — Telehealth: Payer: Self-pay | Admitting: Family Medicine

## 2017-12-06 NOTE — Telephone Encounter (Signed)
No documentation 

## 2017-12-06 NOTE — Telephone Encounter (Signed)
Copied from CRM [phone]. Topic: Quick Communication - Rx Refill/Question >> Dec 06, 2017  9:44 AM Percival SpanishKennedy, Cheryl W wrote: Medication buPROPion (WELLBUTRIN XL) 150 MG 24 hr tablet  Has the patient contacted their pharmacy yes    Preferred Pharmacy  [facility]   Agent: Please be advised that RX refills may take up to 3 business days. We ask that you follow-up with your pharmacy.

## 2017-12-07 MED ORDER — BUPROPION HCL ER (XL) 150 MG PO TB24: 450.0000 mg | ORAL_TABLET | Freq: Every day | ORAL | 0 refills | Status: DC

## 2018-01-11 ENCOUNTER — Other Ambulatory Visit: Payer: Self-pay | Admitting: Family Medicine

## 2018-01-22 ENCOUNTER — Ambulatory Visit: Payer: Managed Care, Other (non HMO) | Admitting: Nurse Practitioner

## 2018-01-22 ENCOUNTER — Encounter: Payer: Self-pay | Admitting: Nurse Practitioner

## 2018-01-22 VITALS — BP 128/70 | HR 64 | Temp 98.1°F | Resp 16 | Ht 64.0 in | Wt 178.1 lb

## 2018-01-22 DIAGNOSIS — E669 Obesity, unspecified: Secondary | ICD-10-CM | POA: Diagnosis not present

## 2018-01-22 NOTE — Patient Instructions (Addendum)
-   One glass of water a day; and consider more ways to include more water in diet - Include more fruits and vegetables in diet - increase dancing, and slowly increase daily step amount;   * Your BMI is 30.3   BMI for Adults Body mass index (BMI) is a number that is calculated from a person's weight and height. In most adults, the number is used to find how much of an adult's weight is made up of fat. BMI is not as accurate as a direct measure of body fat. How is BMI calculated? BMI is calculated by dividing weight in kilograms by height in meters squared. It can also be calculated by dividing weight in pounds by height in inches squared, then multiplying the resulting number by 703. Charts are available to help you find your BMI quickly and easily without doing this calculation. How is BMI interpreted? Health care professionals use BMI charts to identify whether an adult is underweight, at a normal weight, or overweight based on the following guidelines:  Underweight: BMI less than 18.5.  Normal weight: BMI between 18.5 and 24.9.  Overweight: BMI between 25 and 29.9.  Obese: BMI of 30 and above.  BMI is usually interpreted the same for males and females. Weight includes both fat and muscle, so someone with a muscular build, such as an athlete, may have a BMI that is higher than 24.9. In cases like these, BMI may not accurately depict body fat. To determine if excess body fat is the cause of a BMI of 25 or higher, further assessments may need to be done by a health care provider. Why is BMI a useful tool? BMI is used to identify a possible weight problem that may be related to a medical problem or may increase the risk for medical problems. BMI can also be used to promote changes to reach a healthy weight. This information is not intended to replace advice given to you by your health care provider. Make sure you discuss any questions you have with your health care provider. Document Released:  12/07/2003 Document Revised: 08/05/2015 Document Reviewed: 08/22/2013 Elsevier Interactive Patient Education  [employer].

## 2018-01-22 NOTE — Progress Notes (Signed)
Name: Olivia Morgan   MRN: [id number]    DOB: 04/06/1956   Date:01/22/2018       Progress Note  Subjective  Chief Complaint  Chief Complaint  Patient presents with  . Follow-up    HPI  Diet: usually 2 meals a day and snacks in between. Eats leftovers in the morning around 9am cheese and crackers or chicken salad, half a peanut butter sandwich, piece of pizza. Eats lunch around 1pm- eats green peas and cheese, eats out 25% of the time. Eats salads and sandwich. Often skips dinner- but eats a snack like chips and cheese.  Rarely eats fruit- maybe one a week.  Vegetables- 1 to 2 servings.   Exercise: States had leg pain in the past and was not very active but has increased in the past few months increased due to decreased pain. Weekly average 21,000 steps. Dances some on the weekends.     Wt Readings from Last 3 Encounters:  01/22/18 178 lb 1.6 oz (80.8 kg)  11/22/17 176 lb 8 oz (80.1 kg)  06/22/17 187 lb 11.2 oz (85.1 kg)     Patient Active Problem List   Diagnosis Date Noted  . History of total hip replacement 03/05/2017  . Allergic reaction 02/12/2017  . Dermatitis 02/12/2017  . Allergic rhinitis 04/16/2016  . Presence of right artificial knee joint 12/16/2015  . Obesity (BMI 30.0-34.9) 11/18/2015  . Vitamin D deficiency 11/11/2015  . Preventative health care 11/11/2015  . Mass of left lower leg 09/07/2015  . Adverse drug reaction 06/13/2015  . Greater trochanteric bursitis of left hip 06/05/2015  . Hammer toe of right foot 06/05/2015  . Neuropathy of both feet 06/05/2015  . 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 Thrombocytopenia (HCC) 04/05/2015  . Esophageal reflux 03/19/2015  . Need for shingles vaccine 03/14/2015  . Lower back pain 03/10/2015  . Dysphagia 03/10/2015  . Herpes simplex virus infection   . OA (osteoarthritis) of hip   . GERD (gastroesophageal reflux disease)   . Plantar fasciitis   . Depression   . Genital herpes   . Bilateral  hearing loss   . Abnormal ECG 09/09/2014  . Shortness of breath 09/09/2014  . Hip pain 09/09/2012  . Other mechanical complication of other internal orthopedic device, implant, and graft 09/09/2012  . Medial collateral ligament sprain of knee 11/13/2011  . Other and unspecified derangement of medial meniscus 06/13/2011  . Primary localized osteoarthrosis, lower leg 06/13/2011    Past Medical History:  Diagnosis Date  . Bilateral hearing loss    wears hearing aids  . Depression   . Genital herpes   . GERD (gastroesophageal reflux disease)   . Herpes simplex virus infection   . Murmur   . Neuropathy of both feet 06/05/2015  . OA (osteoarthritis) of hip    left  . Osteopenia of lumbar spine 2010  . Plantar fasciitis   . Stomatitis     Past Surgical History:  Procedure Laterality Date  . CESAREAN SECTION    . COLONOSCOPY  2010  . FOOT SURGERY Right   . REPLACEMENT TOTAL KNEE  3/13   [facility]  . TOTAL HIP ARTHROPLASTY Left 10/2008  . UPPER GI ENDOSCOPY  1992    Social History   Tobacco Use  . Smoking status: Never Smoker  . Smokeless tobacco: Never Used  Substance Use Topics  . Alcohol use: Yes    Comment: socially     Current Outpatient  Medications:  .  albuterol (PROVENTIL HFA;VENTOLIN HFA) 108 (90 Base) MCG/ACT inhaler, Inhale 2 puffs into the lungs every 4 (four) hours as needed for wheezing or shortness of breath., Disp: 1 Inhaler, Rfl: 1 .  buPROPion (WELLBUTRIN XL) 150 MG 24 hr tablet, Take 3 tablets (450 mg total) by mouth daily., Disp: 270 tablet, Rfl: 0 .  cyclobenzaprine (FLEXERIL) 10 MG tablet, cyclobenzaprine 10 mg tablet  Take 1 tablet(s) 3 times a day by oral route., Disp: , Rfl:  .  diphenhydrAMINE (BENADRYL) 25 MG tablet, Take 25 mg by mouth every 6 (six) hours as needed., Disp: , Rfl:  .  meloxicam (MOBIC) 15 MG tablet, Take 0.5-1 tablets (7.5-15 mg total) by mouth daily., Disp: 20 tablet, Rfl: 0 .  Multiple Vitamin (MULTIVITAMIN)  tablet, Take 1 tablet by mouth daily., Disp: , Rfl:  .  RABEprazole (ACIPHEX) 20 MG tablet, Take 1 tablet (20 mg total) by mouth daily as needed. Caution:prolonged use may increase risk of pneumonia, colitis, osteoporosis, anemia, Disp: 90 tablet, Rfl: 0 .  valACYclovir (VALTREX) 500 MG tablet, TAKE 1 TABLET BY MOUTH  DAILY, Disp: 90 tablet, Rfl: 3 .  aspirin EC 81 MG tablet, Take 1 tablet (81 mg total) by mouth daily. Take one hour BEFORE any meloxicam or NSAID, Disp: , Rfl:  .  Cholecalciferol (VITAMIN D-3 PO), Take 1,000 Units by mouth once a week. , Disp: , Rfl:  .  IRON PO, Take by mouth., Disp: , Rfl:  .  montelukast (SINGULAIR) 10 MG tablet, Take 1 tablet (10 mg total) by mouth at bedtime. (Patient not taking: Reported on 01/22/2018), Disp: 30 tablet, Rfl: 3 .  NONFORMULARY OR COMPOUNDED ITEM, Apply 1-2 g topically 4 (four) times daily. (Patient not taking: Reported on 11/22/2017), Disp: 120 each, Rfl: 2 .  Omega-3 Fatty Acids (FISH OIL PO), Take by mouth as needed. , Disp: , Rfl:  .  SUPER B COMPLEX/C PO, Take daily by mouth., Disp: , Rfl:  .  traMADol (ULTRAM) 50 MG tablet, tramadol 50 mg tablet  Take 1 tablet every 6 hours by oral route as needed., Disp: , Rfl:  .  vitamin B-12 (CYANOCOBALAMIN) 1000 MCG tablet, Take 1,000 mcg by mouth daily. , Disp: , Rfl:  .  VITAMIN E PO, Take by mouth once a week. , Disp: , Rfl:   Allergies  Allergen Reactions  . Levofloxacin Hives  . Morphine Nausea Only  . Celecoxib Other (See Comments)  . Celexa [Citalopram Hydrobromide]   . Cymbalta [Duloxetine Hcl] Hives  . Gabapentin Other (See Comments)    anger  . Levaquin [Levofloxacin In D5w] Hives  . Valium [Diazepam] Other (See Comments)    hallucinations  . Monistat [Miconazole] Rash  . Prednisone Anxiety    ROS   No other specific complaints in a complete review of systems (except as listed in HPI above).  Objective  Vitals:   01/22/18 1327  BP: 128/70  Pulse: 64  Resp: 16  Temp:  98.1 F (36.7 C)  SpO2: 95%  Weight: 178 lb 1.6 oz (80.8 kg)  Height: 5\' 4"  (1.626 m)    Body mass index is 30.57 kg/m.  Nursing Note and Vital Signs reviewed.  Physical Exam  Constitutional: She is oriented to person, place, and time. She appears well-developed and well-nourished.  HENT:  Head: Normocephalic and atraumatic.  Cardiovascular: Normal rate.  Pulmonary/Chest: Effort normal.  Musculoskeletal: Normal range of motion.  Neurological: She is alert and oriented to person, place,  and time.  Skin: Skin is warm and dry.  Psychiatric: Her behavior is normal. Judgment and thought content normal.       No results found for this or any previous visit (from the past 48 hour(s)).  Assessment & Plan  1. Mildly obese - One glass of water a day; and consider more ways to include more water in diet - Include more fruits and vegetables in diet - increase dancing, and slowly increase daily step amount;

## 2018-01-25 ENCOUNTER — Other Ambulatory Visit: Payer: Self-pay | Admitting: Nurse Practitioner

## 2018-03-11 ENCOUNTER — Ambulatory Visit (INDEPENDENT_AMBULATORY_CARE_PROVIDER_SITE_OTHER): Payer: Managed Care, Other (non HMO) | Admitting: Podiatry

## 2018-03-11 ENCOUNTER — Ambulatory Visit (INDEPENDENT_AMBULATORY_CARE_PROVIDER_SITE_OTHER): Payer: Managed Care, Other (non HMO)

## 2018-03-11 ENCOUNTER — Encounter: Payer: Self-pay | Admitting: Podiatry

## 2018-03-11 DIAGNOSIS — T1490XA Injury, unspecified, initial encounter: Secondary | ICD-10-CM

## 2018-03-11 DIAGNOSIS — S93601A Unspecified sprain of right foot, initial encounter: Secondary | ICD-10-CM | POA: Diagnosis not present

## 2018-03-11 NOTE — Progress Notes (Signed)
This patient presents the office stating that she her the outside top of her right foot yesterday.  She says she stepped on a dog bone and turned her foot.  Since that time she has been icing her right foot and staying off her right foot.  She is also been taking Mobic by mouth to help to relieve the swelling and pain.  She presents the office today for an evaluation of her right foot to make sure there is no severe damage to her right foot.  Vascular  Dorsalis pedis and posterior tibial pulses are palpable  B/L.  Capillary return  WNL.  Temperature gradient is  WNL.  Skin turgor  WNL  Sensorium  Senn Weinstein monofilament wire  WNL. Normal tactile sensation.  Nail Exam  Patient has normal nails with no evidence of bacterial or fungal infection.  Orthopedic  Exam  Muscle tone and muscle strength  WNL.  No limitations of motion feet  B/L.  No crepitus or joint effusion noted.  Foot type is unremarkable and digits show no abnormalities.  Patient has swelling and palpable pain noted to the cuboid area and the base of the fifth metatarsal area right foot.    Skin  No open lesions.  Normal skin texture and turgor  Foot Sprain right foot.    ROV.  X-rays taken of her right foot do reveal a small evulsion fracture at the base of the fifth metatarsal right foot.  Clinically there is no severe pain discomfort or ecchymosis noted.  Therefore I recommended a compression sock to be worn on her right foot and for her to be dispensed a cam walker.  She is to return to the office in 4 weeks for further evaluation and treatment.   Helane GuntherGregory Talise Sligh DPM

## 2018-03-27 ENCOUNTER — Other Ambulatory Visit: Payer: Self-pay | Admitting: Family Medicine

## 2018-03-28 NOTE — Telephone Encounter (Signed)
Last EGD and path report reviewed Patient has remained on PPIs chronically  I will send message to surgeon: Does patient need f/u EGD given the numerous gastric polyps from Dec 2016 EGD and chronic ongoing use of PPI? Thank you

## 2018-03-28 NOTE — Telephone Encounter (Signed)
Per surgeon:  Not unless change in symptoms.

## 2018-04-08 ENCOUNTER — Ambulatory Visit: Payer: Managed Care, Other (non HMO) | Admitting: Podiatry

## 2018-05-16 NOTE — Discharge Instructions (Signed)
[facility] °[facility] °ENDOSCOPIC SINUS SURGERY °[facility] ° °What is Functional Endoscopic Sinus Surgery? ° The Surgery involves making the natural openings of the sinuses larger by removing the bony partitions that separate the sinuses from the nasal cavity.  The natural sinus lining is preserved as much as possible to allow the sinuses to resume normal function after the surgery.  In some patients nasal polyps (excessively swollen lining of the sinuses) may be removed to relieve obstruction of the sinus openings.  The surgery is performed through the nose using lighted scopes, which eliminates the need for incisions on the face.  A septoplasty is a different procedure which is sometimes performed with sinus surgery.  It involves straightening the boy partition that separates the two sides of your nose.  A crooked or deviated septum may need repair if is obstructing the sinuses or nasal airflow.  Turbinate reduction is also often performed during sinus surgery.  The turbinates are bony proturberances from the side walls of the nose which swell and can obstruct the nose in patients with sinus and allergy problems.  Their size can be surgically reduced to help relieve nasal obstruction. ° °What Can Sinus Surgery Do For Me? ° Sinus surgery can reduce the frequency of sinus infections requiring antibiotic treatment.  This can provide improvement in nasal congestion, post-nasal drainage, facial pressure and nasal obstruction.  Surgery will NOT prevent you from ever having an infection again, so it usually only for patients who get infections 4 or more times yearly requiring antibiotics, or for infections that do not clear with antibiotics.  It will not cure nasal allergies, so patients with allergies may still require medication to treat their allergies after surgery. Surgery may improve headaches related to sinusitis, however, some people will continue to  require medication to control sinus headaches related to allergies.  Surgery will do nothing for other forms of headache (migraine, tension or cluster). ° °What Are the Risks of Endoscopic Sinus Surgery? ° Current techniques allow surgery to be performed safely with little risk, however, there are rare complications that patients should be aware of.  Because the sinuses are located around the eyes, there is risk of eye injury, including blindness, though again, this would be quite rare. This is usually a result of bleeding behind the eye during surgery, which puts the vision oat risk, though there are treatments to protect the vision and prevent permanent disrupted by surgery causing a leak of the spinal fluid that surrounds the brain.  More serious complications would include bleeding inside the brain cavity or damage to the brain.  Again, all of these complications are uncommon, and spinal fluid leaks can be safely managed surgically if they occur.  The most common complication of sinus surgery is bleeding from the nose, which may require packing or cauterization of the nose.  Continued sinus have polyps may experience recurrence of the polyps requiring revision surgery.  Alterations of sense of smell or injury to the tear ducts are also rare complications.  ° °What is the Surgery Like, and what is the Recovery? ° The Surgery usually takes a couple of hours to perform, and is usually performed under a general anesthetic (completely asleep).  Patients are usually discharged home after a couple of hours.  Sometimes during surgery it is necessary to pack the nose to control bleeding, and the packing is left in place for 24 - 48 hours, and removed by your surgeon.    If a septoplasty was performed during the procedure, there is often a splint placed which must be removed after 5-7 days.   °Discomfort: Pain is usually mild to moderate, and can be controlled by prescription pain medication or acetaminophen (Tylenol).   Aspirin, Ibuprofen (Advil, Motrin), or Naprosyn (Aleve) should be avoided, as they can cause increased bleeding.  Most patients feel sinus pressure like they have a bad head cold for several days.  Sleeping with your head elevated can help reduce swelling and facial pressure, as can ice packs over the face.  A humidifier may be helpful to keep the mucous and blood from drying in the nose.  ° °Diet: There are no specific diet restrictions, however, you should generally start with clear liquids and a light diet of bland foods because the anesthetic can cause some nausea.  Advance your diet depending on how your stomach feels.  Taking your pain medication with food will often help reduce stomach upset which pain medications can cause. ° °Nasal Saline Irrigation: It is important to remove blood clots and dried mucous from the nose as it is healing.  This is done by having you irrigate the nose at least 3 - 4 times daily with a salt water solution.  We recommend using NeilMed Sinus Rinse (available at the drug store).  Fill the squeeze bottle with the solution, bend over a sink, and insert the tip of the squeeze bottle into the nose ½ of an inch.  Point the tip of the squeeze bottle towards the inside corner of the eye on the same side your irrigating.  Squeeze the bottle and gently irrigate the nose.  If you bend forward as you do this, most of the fluid will flow back out of the nose, instead of down your throat.   The solution should be warm, near body temperature, when you irrigate.   Each time you irrigate, you should use a full squeeze bottle.  ° °Note that if you are instructed to use Nasal Steroid Sprays at any time after your surgery, irrigate with saline BEFORE using the steroid spray, so you do not wash it all out of the nose. °Another product, Nasal Saline Gel (such as AYR Nasal Saline Gel) can be applied in each nostril 3 - 4 times daily to moisture the nose and reduce scabbing or crusting. ° °Bleeding:   Bloody drainage from the nose can be expected for several days, and patients are instructed to irrigate their nose frequently with salt water to help remove mucous and blood clots.  The drainage may be dark red or brown, though some fresh blood may be seen intermittently, especially after irrigation.  Do not blow you nose, as bleeding may occur. If you must sneeze, keep your mouth open to allow air to escape through your mouth. ° °If heavy bleeding occurs: Irrigate the nose with saline to rinse out clots, then spray the nose 3 - 4 times with Afrin Nasal Decongestant Spray.  The spray will constrict the blood vessels to slow bleeding.  Pinch the lower half of your nose shut to apply pressure, and lay down with your head elevated.  Ice packs over the nose may help as well. If bleeding persists despite these measures, you should notify your doctor.  Do not use the Afrin routinely to control nasal congestion after surgery, as it can result in worsening congestion and may affect healing.  ° °Activity: Return to work varies among patients. Most patients will be out of   work at least 5 - 7 days to recover.  Patient may return to work after they are off of narcotic pain medication, and feeling well enough to perform the functions of their job.  Patients must avoid heavy lifting (over 10 pounds) or strenuous physical for 2 weeks after surgery, so your employer may need to assign you to light duty, or keep you out of work longer if light duty is not possible.  NOTE: you should not drive, operate dangerous machinery, do any mentally demanding tasks or make any important legal or financial decisions while on narcotic pain medication and recovering from the general anesthetic.  °  °Call Your Doctor Immediately if You Have Any of the Following: °1. Bleeding that you cannot control with the above measures °2. Loss of vision, double vision, bulging of the eye or black eyes. °3. Fever over 101 degrees °4. Neck stiffness with severe  headache, fever, nausea and change in mental state. °You are always encourage to call anytime with concerns, however, please call with requests for pain medication refills during office hours. ° °Office Endoscopy: During follow-up visits your doctor will remove any packing or splints that may have been placed and evaluate and clean your sinuses endoscopically.  Topical anesthetic will be used to make this as comfortable as possible, though you may want to take your pain medication prior to the visit.  How often this will need to be done varies from patient to patient.  After complete recovery from the surgery, you may need follow-up endoscopy from time to time, particularly if there is concern of recurrent infection or nasal polyps. ° ° °General Anesthesia, Adult, Care After °This sheet gives you information about how to care for yourself after your procedure. Your health care provider may also give you more specific instructions. If you have problems or questions, contact your health care provider. °What can I expect after the procedure? °After the procedure, the following side effects are common: °· Pain or discomfort at the IV site. °· Nausea. °· Vomiting. °· Sore throat. °· Trouble concentrating. °· Feeling cold or chills. °· Weak or tired. °· Sleepiness and fatigue. °· Soreness and body aches. These side effects can affect parts of the body that were not involved in surgery. °Follow these instructions at home: ° °For at least 24 hours after the procedure: °· Have a responsible adult stay with you. It is important to have someone help care for you until you are awake and alert. °· Rest as needed. °· Do not: °? Participate in activities in which you could fall or become injured. °? Drive. °? Use heavy machinery. °? Drink alcohol. °? Take sleeping pills or medicines that cause drowsiness. °? Make important decisions or sign legal documents. °? Take care of children on your own. °Eating and drinking °· Follow any  instructions from your health care provider about eating or drinking restrictions. °· When you feel hungry, start by eating small amounts of foods that are soft and easy to digest (bland), such as toast. Gradually return to your regular diet. °· Drink enough fluid to keep your urine pale yellow. °· If you vomit, rehydrate by drinking water, juice, or clear broth. °General instructions °· If you have sleep apnea, surgery and certain medicines can increase your risk for breathing problems. Follow instructions from your health care provider about wearing your sleep device: °? Anytime you are sleeping, including during daytime naps. °? While taking prescription pain medicines, sleeping medicines, or medicines that make   you drowsy. °· Return to your normal activities as told by your health care provider. Ask your health care provider what activities are safe for you. °· Take over-the-counter and prescription medicines only as told by your health care provider. °· If you smoke, do not smoke without supervision. °· Keep all follow-up visits as told by your health care provider. This is important. °Contact a health care provider if: °· You have nausea or vomiting that does not get better with medicine. °· You cannot eat or drink without vomiting. °· You have pain that does not get better with medicine. °· You are unable to pass urine. °· You develop a skin rash. °· You have a fever. °· You have redness around your IV site that gets worse. °Get help right away if: °· You have difficulty breathing. °· You have chest pain. °· You have blood in your urine or stool, or you vomit blood. °Summary °· After the procedure, it is common to have a sore throat or nausea. It is also common to feel tired. °· Have a responsible adult stay with you for the first 24 hours after general anesthesia. It is important to have someone help care for you until you are awake and alert. °· When you feel hungry, start by eating small amounts of foods  that are soft and easy to digest (bland), such as toast. Gradually return to your regular diet. °· Drink enough fluid to keep your urine pale yellow. °· Return to your normal activities as told by your health care provider. Ask your health care provider what activities are safe for you. °This information is not intended to replace advice given to you by your health care provider. Make sure you discuss any questions you have with your health care provider. °Document Released: 07/03/2000 Document Revised: 11/10/2016 Document Reviewed: 11/10/2016 °Elsevier Interactive Patient Education © 2019 [employer] ° °

## 2018-05-23 ENCOUNTER — Encounter
Admission: RE | Disposition: A | Discharge status: Home / Self Care | Payer: Self-pay | Source: Home / Self Care | Attending: Otolaryngology

## 2018-05-23 ENCOUNTER — Ambulatory Visit
Admission: RE | Admit: 2018-05-23 | Discharge: 2018-05-23 | Disposition: A | Discharge status: Home / Self Care | Payer: Managed Care, Other (non HMO) | Attending: Otolaryngology | Admitting: Otolaryngology

## 2018-05-23 ENCOUNTER — Ambulatory Visit: Payer: Managed Care, Other (non HMO) | Admitting: Anesthesiology

## 2018-05-23 DIAGNOSIS — J343 Hypertrophy of nasal turbinates: Secondary | ICD-10-CM | POA: Diagnosis not present

## 2018-05-23 DIAGNOSIS — J342 Deviated nasal septum: Secondary | ICD-10-CM | POA: Insufficient documentation

## 2018-05-23 DIAGNOSIS — K219 Gastro-esophageal reflux disease without esophagitis: Secondary | ICD-10-CM | POA: Diagnosis not present

## 2018-05-23 DIAGNOSIS — J3489 Other specified disorders of nose and nasal sinuses: Secondary | ICD-10-CM | POA: Insufficient documentation

## 2018-05-23 DIAGNOSIS — Z79899 Other long term (current) drug therapy: Secondary | ICD-10-CM | POA: Diagnosis not present

## 2018-05-23 HISTORY — PX: SEPTOPLASTY: SHX2393

## 2018-05-23 HISTORY — PX: ENDOSCOPIC CONCHA BULLOSA RESECTION: SHX6395

## 2018-05-23 HISTORY — PX: TURBINATE REDUCTION: SHX6157

## 2018-05-23 SURGERY — SEPTOPLASTY, NOSE
Anesthesia: General | Site: Nose | Laterality: Left

## 2018-05-23 MED ORDER — LIDOCAINE HCL (CARDIAC) PF 100 MG/5ML IV SOSY
PREFILLED_SYRINGE | INTRAVENOUS | Status: DC | PRN
  Administered 2018-05-23: 40 mg via INTRAVENOUS

## 2018-05-23 MED ORDER — OXYCODONE HCL 5 MG PO TABS
5.0000 mg | ORAL_TABLET | Freq: Once | ORAL | Status: AC | PRN
  Administered 2018-05-23: 5 mg via ORAL

## 2018-05-23 MED ORDER — MIDAZOLAM HCL 5 MG/5ML IJ SOLN
INTRAMUSCULAR | Status: DC | PRN
  Administered 2018-05-23: 2 mg via INTRAVENOUS

## 2018-05-23 MED ORDER — GLYCOPYRROLATE 0.2 MG/ML IJ SOLN
INTRAMUSCULAR | Status: DC | PRN
  Administered 2018-05-23: 0.1 mg via INTRAVENOUS

## 2018-05-23 MED ORDER — PREDNISONE 10 MG PO TABS: ORAL_TABLET | ORAL | 0 refills | Status: DC

## 2018-05-23 MED ORDER — SUCCINYLCHOLINE CHLORIDE 20 MG/ML IJ SOLN
INTRAMUSCULAR | Status: DC | PRN
  Administered 2018-05-23: 80 mg via INTRAVENOUS

## 2018-05-23 MED ORDER — ONDANSETRON HCL 4 MG/2ML IJ SOLN
INTRAMUSCULAR | Status: DC | PRN
  Administered 2018-05-23: 4 mg via INTRAVENOUS

## 2018-05-23 MED ORDER — ACETAMINOPHEN 10 MG/ML IV SOLN
1000.0000 mg | Freq: Once | INTRAVENOUS | Status: AC
  Administered 2018-05-23: 1000 mg via INTRAVENOUS

## 2018-05-23 MED ORDER — LACTATED RINGERS IV SOLN
INTRAVENOUS | Status: DC
  Administered 2018-05-23 (×2): via INTRAVENOUS

## 2018-05-23 MED ORDER — DEXAMETHASONE SODIUM PHOSPHATE 4 MG/ML IJ SOLN
INTRAMUSCULAR | Status: DC | PRN
  Administered 2018-05-23: 8 mg via INTRAVENOUS
  Administered 2018-05-23: 4 mg via INTRAVENOUS

## 2018-05-23 MED ORDER — CEPHALEXIN 500 MG PO CAPS: 500.0000 mg | ORAL_CAPSULE | Freq: Two times a day (BID) | ORAL | 0 refills | Status: DC

## 2018-05-23 MED ORDER — PHENYLEPHRINE HCL 0.5 % NA SOLN
NASAL | Status: DC | PRN
  Administered 2018-05-23: 30 mL

## 2018-05-23 MED ORDER — HYDROCODONE-ACETAMINOPHEN 5-325 MG PO TABS: 1.0000 | ORAL_TABLET | Freq: Four times a day (QID) | ORAL | 0 refills | Status: AC | PRN

## 2018-05-23 MED ORDER — PROPOFOL 10 MG/ML IV BOLUS
INTRAVENOUS | Status: DC | PRN
  Administered 2018-05-23: 30 mg via INTRAVENOUS
  Administered 2018-05-23: 50 mg via INTRAVENOUS
  Administered 2018-05-23: 130 mg via INTRAVENOUS

## 2018-05-23 MED ORDER — FENTANYL CITRATE (PF) 100 MCG/2ML IJ SOLN: 25.0000 ug | INTRAMUSCULAR | Status: DC | PRN

## 2018-05-23 MED ORDER — LIDOCAINE-EPINEPHRINE 1 %-1:100000 IJ SOLN
INTRAMUSCULAR | Status: DC | PRN
  Administered 2018-05-23: 7 mL

## 2018-05-23 MED ORDER — EPHEDRINE SULFATE 50 MG/ML IJ SOLN
INTRAMUSCULAR | Status: DC | PRN
  Administered 2018-05-23 (×3): 5 mg via INTRAVENOUS
  Administered 2018-05-23: 10 mg via INTRAVENOUS

## 2018-05-23 MED ORDER — DEXTROSE 5 % IV SOLN
2000.0000 mg | Freq: Once | INTRAVENOUS | Status: AC
  Administered 2018-05-23: 2000 mg via INTRAVENOUS

## 2018-05-23 MED ORDER — ONDANSETRON HCL 4 MG/2ML IJ SOLN: 4.0000 mg | Freq: Once | INTRAMUSCULAR | Status: DC | PRN

## 2018-05-23 MED ORDER — OXYMETAZOLINE HCL 0.05 % NA SOLN: 2.0000 | Freq: Once | NASAL | Status: DC

## 2018-05-23 MED ORDER — OXYCODONE HCL 5 MG/5ML PO SOLN: 5.0000 mg | Freq: Once | ORAL | Status: AC | PRN

## 2018-05-23 MED ORDER — FENTANYL CITRATE (PF) 100 MCG/2ML IJ SOLN
INTRAMUSCULAR | Status: DC | PRN
  Administered 2018-05-23: 50 ug via INTRAVENOUS
  Administered 2018-05-23: 12.5 ug via INTRAVENOUS

## 2018-05-23 MED ORDER — SODIUM CHLORIDE 0.9 % IV SOLN
800.0000 mg | Freq: Once | INTRAVENOUS | Status: AC
  Administered 2018-05-23: 800 mg via INTRAVENOUS

## 2018-05-23 SURGICAL SUPPLY — 26 items
CANISTER SUCT 1200ML W/VALVE (MISCELLANEOUS) ×3 IMPLANT
COAGULATOR SUCT 8FR VV (MISCELLANEOUS) ×3 IMPLANT
ELECT REM PT RETURN 9FT ADLT (ELECTROSURGICAL) ×3
ELECTRODE REM PT RTRN 9FT ADLT (ELECTROSURGICAL) ×2 IMPLANT
GLOVE PI ULTRA LF STRL 7.5 (GLOVE) ×6 IMPLANT
GLOVE PI ULTRA NON LATEX 7.5 (GLOVE) ×3
GOWN STRL REUS W/ TWL LRG LVL3 (GOWN DISPOSABLE) ×2 IMPLANT
GOWN STRL REUS W/TWL LRG LVL3 (GOWN DISPOSABLE) ×1
KIT TURNOVER KIT A (KITS) ×3 IMPLANT
NEEDLE ANESTHESIA  27G X 3.5 (NEEDLE) ×1
NEEDLE ANESTHESIA 27G X 3.5 (NEEDLE) ×2 IMPLANT
NEEDLE HYPO 27GX1-1/4 (NEEDLE) ×3 IMPLANT
PACK ENT CUSTOM (PACKS) ×3 IMPLANT
PATTIES SURGICAL .5 X3 (DISPOSABLE) ×3 IMPLANT
SOL ANTI-FOG 6CC FOG-OUT (MISCELLANEOUS) ×2 IMPLANT
SOL FOG-OUT ANTI-FOG 6CC (MISCELLANEOUS) ×1
SPLINT NASAL SEPTAL BLV .50 ST (MISCELLANEOUS) ×3 IMPLANT
STRAP BODY AND KNEE 60X3 (MISCELLANEOUS) ×3 IMPLANT
SUT CHROMIC 3-0 (SUTURE) ×1
SUT CHROMIC 3-0 KS 27XMFL CR (SUTURE) ×2
SUT ETHILON 3-0 KS 30 BLK (SUTURE) ×3 IMPLANT
SUT PLAIN GUT 4-0 (SUTURE) ×3 IMPLANT
SUTURE CHRMC 3-0 KS 27XMFL CR (SUTURE) ×2 IMPLANT
SYR 3ML LL SCALE MARK (SYRINGE) ×3 IMPLANT
TOWEL OR 17X26 4PK STRL BLUE (TOWEL DISPOSABLE) ×3 IMPLANT
WATER STERILE IRR 250ML POUR (IV SOLUTION) ×3 IMPLANT

## 2018-05-23 NOTE — Anesthesia Postprocedure Evaluation (Signed)
Anesthesia Post Note  Patient: Olivia Morgan  Procedure(s) Performed: SEPTOPLASTY (Bilateral Nose) INFERIOR TURBINATE REDUCTION (Bilateral Nose) ENDOSCOPIC CONCHA BULLOSA RESECTION (Left Nose)  Patient location during evaluation: PACU Anesthesia Type: General Level of consciousness: awake and alert and oriented Pain management: satisfactory to patient Vital Signs Assessment: post-procedure vital signs reviewed and stable Respiratory status: spontaneous breathing, nonlabored ventilation and respiratory function stable Cardiovascular status: blood pressure returned to baseline and stable Postop Assessment: Adequate PO intake and No signs of nausea or vomiting Anesthetic complications: no    [name], [name]

## 2018-05-23 NOTE — Transfer of Care (Signed)
Immediate Anesthesia Transfer of Care Note  Patient: Olivia Morgan  Procedure(s) Performed: SEPTOPLASTY (Bilateral Nose) INFERIOR TURBINATE REDUCTION (Bilateral Nose) ENDOSCOPIC CONCHA BULLOSA RESECTION (Left Nose)  Patient Location: PACU  Anesthesia Type: General ETT  Level of Consciousness: awake, alert  and patient cooperative  Airway and Oxygen Therapy: Patient Spontanous Breathing and Patient connected to supplemental oxygen  Post-op Assessment: Post-op Vital signs reviewed, Patient's Cardiovascular Status Stable, Respiratory Function Stable, Patent Airway and No signs of Nausea or vomiting  Post-op Vital Signs: Reviewed and stable  Complications: No apparent anesthesia complications

## 2018-05-23 NOTE — Anesthesia Procedure Notes (Signed)
Procedure Name: Intubation Date/Time: 05/23/2018 12:04 PM Performed by: Mayme Genta, CRNA Pre-anesthesia Checklist: Patient identified, Emergency Drugs available, Suction available, Patient being monitored and Timeout performed Patient Re-evaluated:Patient Re-evaluated prior to induction Oxygen Delivery Method: Circle system utilized Preoxygenation: Pre-oxygenation with 100% oxygen Induction Type: IV induction Ventilation: Mask ventilation without difficulty Laryngoscope Size: Glidescope and 3 Grade View: Grade I Tube type: Oral Rae Tube size: 7.0 mm Number of attempts: 3 Placement Confirmation: ETT inserted through vocal cords under direct vision,  positive ETCO2 and breath sounds checked- equal and bilateral Tube secured with: Tape Dental Injury: Teeth and Oropharynx as per pre-operative assessment  Difficulty Due To: Difficulty was anticipated and Difficult Airway- due to anterior larynx Comments: Attempted DL x 1 with Miller 2. Unable to visualize vocal cords. Glidescope with MAC 3 blade, grade I view, difficulty passing ETT through cords. During first glidescope attempt, oral secretions obstructed view. Oropharynx suctioned and Dr Junious Dresser attempted with glidescope for a grade I view. ETT inserted carefully through cords. Tolerated well by pt. +/=BBS.

## 2018-05-23 NOTE — Anesthesia Preprocedure Evaluation (Addendum)
Anesthesia Evaluation  Patient identified by MRN, date of birth, ID band Patient awake    Reviewed: Allergy & Precautions, H&P , NPO status , Patient's Chart, lab work & pertinent test results  Airway Mallampati: II  TM Distance: <3 FB Neck ROM: full    Dental no notable dental hx.    Pulmonary    Pulmonary exam normal breath sounds clear to auscultation       Cardiovascular Normal cardiovascular exam Rhythm:regular Rate:Normal     Neuro/Psych PSYCHIATRIC DISORDERS  Neuromuscular disease    GI/Hepatic GERD  ,  Endo/Other    Renal/GU      Musculoskeletal   Abdominal   Peds  Hematology   Anesthesia Other Findings Possible difficult airway  Reproductive/Obstetrics                             Anesthesia Physical Anesthesia Plan  ASA: II  Anesthesia Plan: General ETT   Post-op Pain Management:    Induction:   PONV Risk Score and Plan: 3 and Ondansetron, Dexamethasone, Midazolam and Treatment may vary due to age or medical condition  Airway Management Planned:   Additional Equipment:   Intra-op Plan:   Post-operative Plan:   Informed Consent: I have reviewed the patients History and Physical, chart, labs and discussed the procedure including the risks, benefits and alternatives for the proposed anesthesia with the patient or authorized representative who has indicated his/her understanding and acceptance.       Plan Discussed with: CRNA  Anesthesia Plan Comments:         Anesthesia Quick Evaluation

## 2018-05-23 NOTE — H&P (Signed)
H&P has been reviewed and patient reevaluated, no changes necessary. To be downloaded later.  

## 2018-05-23 NOTE — Op Note (Signed)
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  The inferior turbinates were enlarged as well.  The nose was quite twisted anteriorly with the tip pulled to her right side.  Procedure: With the patient in a comfortable supine position,  general orotracheal anesthesia was induced without difficulty.     The patient received preoperative Afrin spray for topical decongestion and vasoconstriction.  Intravenous prophylactic antibiotics were administered.  At an appropriate level, the patient was placed in a semi-sitting position.  Nasal vibrissae were trimmed.   1% Xylocaine with 1:100,000 epinephrine, 7 cc's, was infiltrated into the anterior floor of the nose, into the nasal spine region, into the membranous columella, and finally into the submucoperichondrial plane of the septum on both sides.  Several minutes were allowed for this to take effect.  Cottoniod pledgetts soaked in Afrin and 4% Xylocaine were placed into both nasal cavities and left while the patient was prepped and draped in the standard fashion.  The materials were removed from the nose and observed to be intact and correct in number.  The nose was inspected with a headlight and zero degree scope with the findings as described above.  An incision was created in the bottom of the left middle turbinate.  This opened up into the concha bullosa.  The entire lateral wall of the middle turbinate was removed to open up the middle meatus and into the ethmoid bulla.  The turbinate was very big and floppy and some of  the anterior wall more medially was trimmed as well.  This was trimmed back to create a smaller middle turbinate that was not obstructing her upper airway on the left side.  Electrocautery was used along the trimmed edge to help control bleeding here.  A left Killian incision was sharply executed and carried down to the quadrangular cartilage. The mucoperichondrium was elelvated along the quadrangular plate back to the bony-cartilaginous junction. The mucoperiostium was then elevated along the ethmoid plate and the vomer. The boney-catilaginous junction was then split with a freer elevator and the mucoperiosteum was elevated on the opposite side. The mucoperiosteum was then elevated along the maxillary crest as needed to expose the crooked bone of the crest.  Boney spurs of the vomer and maxillary crest were removed with Lenoria Chime forceps.  The cartilaginous plate was trimmed along its posterior and inferior borders of about 2 mm of cartilage to free it up inferiorly. Some of the deviated ethmoid plate was then fractured and removed with Takahashi forceps to free up the posterior border of the quadrangular plate and allow it to swing back to the midline.  A morselizer was used to break the spring of the quadrangular cartilage and it was separated from the left upper lateral cartilage to help remove some of the twist of the nasal tip.  The mucosal flaps were placed back into their anatomic position to allow visualization of the airways. The septum now sat in the midline with an improved airway.  A 3-0 Chromic suture on a Keith needle in used to anchor the inferior septum  at the nasal spine with a through and through suture. The mucosal flaps are then sutured together using a through and through whip stitch of 4-0 Plain Gut with a mini-Keith needle. This was used to close the Sanborn incision as well.   The inferior turbinates were then inspected. An incision was created along the inferior aspect of the left  inferior turbinate with removal of some of the inferior soft tissue and bone. Electrocautery was used to control bleeding in the area. The remaining turbinate was then outfractured to open up the airway further. There was no significant bleeding noted. The right turbinate was then trimmed and outfractured in a similar fashion.   The airways were then visualized and showed open passageways on both sides that were significantly improved compared to before surgery. There was no signifcant bleeding. Nasal splints were applied to both sides of the septum using Xomed 0.53mm regular sized splints that were trimmed, and then held in position with a 3-0 Nylon through and through suture.  Xerogel was placed in the left middle meatus and overlying the trimmed left middle turbinate.  This was fully wetted.  The patient was turned back over to anesthesia, and awakened, extubated, and taken to the PACU in satisfactory condition.  Dispo:   PACU to home  Plan: Ice, elevation, narcotic analgesia, steroid taper, and prophylactic antibiotics for the duration of indwelling nasal foreign bodies.  Start saline flushes tomorrow.  We will reevaluate the patient in the office in 6 days and remove the septal splints.  Return to work in 10 days, strenuous activities in two weeks.   Olivia Morgan 05/23/2018 1:35 PM

## 2018-05-23 NOTE — Progress Notes (Signed)
Pt complaining of chest pressure, pointing to epigastric area. Oxygen saturation 100%, lung sounds clear, NSR. Dr. Francena Hanly informed & evaluated pt. No new orders received. Had pt perform incentive spirometer, achieved 1000-1500, encouraged to continue at home 10 times every hour while you're awake, pt & brother verbalize understanding.

## 2018-07-22 ENCOUNTER — Other Ambulatory Visit: Payer: Self-pay | Admitting: Family Medicine

## 2018-07-31 ENCOUNTER — Other Ambulatory Visit: Payer: Self-pay | Admitting: Family Medicine

## 2018-09-23 ENCOUNTER — Ambulatory Visit: Payer: Self-pay | Admitting: *Deleted

## 2018-09-23 ENCOUNTER — Other Ambulatory Visit: Payer: Self-pay

## 2018-09-23 ENCOUNTER — Ambulatory Visit: Payer: Managed Care, Other (non HMO) | Admitting: Nurse Practitioner

## 2018-09-23 ENCOUNTER — Ambulatory Visit
Admission: RE | Admit: 2018-09-23 | Discharge: 2018-09-23 | Disposition: A | Discharge status: Home / Self Care | Payer: Managed Care, Other (non HMO) | Source: Ambulatory Visit | Attending: Nurse Practitioner | Admitting: Nurse Practitioner

## 2018-09-23 ENCOUNTER — Encounter: Payer: Self-pay | Admitting: Nurse Practitioner

## 2018-09-23 VITALS — BP 124/72 | HR 74 | Temp 97.9°F | Resp 14 | Ht 64.0 in | Wt 181.9 lb

## 2018-09-23 DIAGNOSIS — M79662 Pain in left lower leg: Secondary | ICD-10-CM | POA: Insufficient documentation

## 2018-09-23 DIAGNOSIS — I83812 Varicose veins of left lower extremities with pain: Secondary | ICD-10-CM | POA: Diagnosis not present

## 2018-09-23 MED ORDER — MELOXICAM 7.5 MG PO TABS: 7.5000 mg | ORAL_TABLET | Freq: Every day | ORAL | 2 refills | Status: DC | PRN

## 2018-09-23 NOTE — Telephone Encounter (Signed)
Pt seen by Benjamine Mola NP-C today.

## 2018-09-23 NOTE — Progress Notes (Signed)
Name: Olivia Morgan   MRN: [id number]    DOB: 07-30-1955   Date:09/23/2018       Progress Note  Subjective  Chief Complaint  Chief Complaint  Patient presents with  . Leg Pain    onset Saturday    HPI  Patient endorses left leg pain started on Saturday, states noticed if when she got out of bed in the morning. Patient endorses constant pain in front of her left lower leg. Pain is worse with elevation and straightening leg. Patient has taken OTC tylenol with mild relief of symptoms. Pain is rated 6/10.  Denies smoking, recent surgery, HRT, no recent long trips or immobility. No known injuries  Pain in left posterior calf pain that has been tender to palpation for the past few years- states it is tender when she touches area of varicose veins.  Patient states left leg has always been more swollen compared to right but states pain is new.   PHQ2/9: Depression screen [facility] 09/23/2018 01/22/2018 11/22/2017 11/22/2017 02/12/2017  Decreased Interest 0 0 0 1 0  Down, Depressed, Hopeless 0 0 0 0 0  PHQ - 2 Score 0 0 0 1 0  Altered sleeping 0 3 - 3 -  Tired, decreased energy 0 1 - 0 -  Change in appetite 0 1 - 0 -  Feeling bad or failure about yourself  0 0 - 0 -  Trouble concentrating 0 0 - 0 -  Moving slowly or fidgety/restless 0 0 - 0 -  Suicidal thoughts 0 0 - 0 -  PHQ-9 Score 0 5 - 4 -  Difficult doing work/chores Not difficult at all Not difficult at all - Not difficult at all -     PHQ reviewed. Negative  Patient Active Problem List   Diagnosis Date Noted  . History of total hip replacement 03/05/2017  . Allergic reaction 02/12/2017  . Dermatitis 02/12/2017  . Allergic rhinitis 04/16/2016  . Presence of right artificial knee joint 12/16/2015  . Obesity (BMI 30.0-34.9) 11/18/2015  . Vitamin D deficiency 11/11/2015  . Preventative health care 11/11/2015  . Mass of left lower leg 09/07/2015  . Adverse drug reaction 06/13/2015  . Greater trochanteric bursitis of left hip  06/05/2015  . Hammer toe of right foot 06/05/2015  . Neuropathy of both feet 06/05/2015  . 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 Thrombocytopenia (HCC) 04/05/2015  . Esophageal reflux 03/19/2015  . Need for shingles vaccine 03/14/2015  . Lower back pain 03/10/2015  . Dysphagia 03/10/2015  . Herpes simplex virus infection   . OA (osteoarthritis) of hip   . GERD (gastroesophageal reflux disease)   . Plantar fasciitis   . Depression   . Genital herpes   . Bilateral hearing loss   . Abnormal ECG 09/09/2014  . Shortness of breath 09/09/2014  . Hip pain 09/09/2012  . Other mechanical complication of other internal orthopedic device, implant, and graft 09/09/2012  . Medial collateral ligament sprain of knee 11/13/2011  . Other and unspecified derangement of medial meniscus 06/13/2011  . Primary localized osteoarthrosis, lower leg 06/13/2011    Past Medical History:  Diagnosis Date  . Bilateral hearing loss    wears hearing aids  . Depression   . Genital herpes   . GERD (gastroesophageal reflux disease)   . Herpes simplex virus infection   . Murmur   . Neuropathy of both feet 06/05/2015  . OA (osteoarthritis) of hip    left  .  Osteopenia of lumbar spine 2010  . Plantar fasciitis   . Stomatitis     Past Surgical History:  Procedure Laterality Date  . CESAREAN SECTION    . COLONOSCOPY  2010  . ENDOSCOPIC CONCHA BULLOSA RESECTION Left 05/23/2018   Procedure: ENDOSCOPIC CONCHA BULLOSA RESECTION;  Surgeon: Vernie MurdersJuengel, Paul, MD;  [facility];  Service: ENT;  Laterality: Left;  . FOOT SURGERY Right   . REPLACEMENT TOTAL KNEE  [facility]  . SEPTOPLASTY Bilateral 05/23/2018   Procedure: SEPTOPLASTY;  Surgeon: Vernie MurdersJuengel, Paul, MD;  [facility];  Service: ENT;  Laterality: Bilateral;  . TOTAL HIP ARTHROPLASTY Left 10/2008  . TURBINATE REDUCTION Bilateral 05/23/2018   Procedure: INFERIOR TURBINATE REDUCTION;   Surgeon: Vernie MurdersJuengel, Paul, MD;  [facility];  Service: ENT;  Laterality: Bilateral;  . UPPER GI ENDOSCOPY  1992    Social History   Tobacco Use  . Smoking status: Never Smoker  . Smokeless tobacco: Never Used  Substance Use Topics  . Alcohol use: Yes    Comment: socially     Current Outpatient Medications:  .  buPROPion (WELLBUTRIN XL) 150 MG 24 hr tablet, TAKE 3 TABLETS BY MOUTH  DAILY, Disp: 270 tablet, Rfl: 3 .  Cholecalciferol (VITAMIN D-3 PO), Take 1,000 Units by mouth once a week. , Disp: , Rfl:  .  diphenhydrAMINE (BENADRYL) 25 MG tablet, Take 25 mg by mouth every 6 (six) hours as needed., Disp: , Rfl:  .  Multiple Vitamin (MULTIVITAMIN) tablet, Take 1 tablet by mouth daily., Disp: , Rfl:  .  RABEprazole (ACIPHEX) 20 MG tablet, TAKE 1 TABLET BY MOUTH  DAILY. CAUTION: PROLONGED  USE MAY INCREASE RISK OF  PNEUMONIA, COLITIS,  OSTEOPOROSIS, ANEMIA, Disp: 90 tablet, Rfl: 0 .  triamcinolone (NASACORT ALLERGY 24HR) 55 MCG/ACT AERO nasal inhaler, Place 2 sprays into the nose daily., Disp: , Rfl:  .  valACYclovir (VALTREX) 500 MG tablet, TAKE 1 TABLET BY MOUTH  DAILY, Disp: 90 tablet, Rfl: 3 .  VITAMIN E PO, Take by mouth once a week. , Disp: , Rfl:  .  albuterol (PROVENTIL HFA;VENTOLIN HFA) 108 (90 Base) MCG/ACT inhaler, Inhale 2 puffs into the lungs every 4 (four) hours as needed for wheezing or shortness of breath. (Patient not taking: Reported on 09/23/2018), Disp: 1 Inhaler, Rfl: 1 .  cephALEXin (KEFLEX) 500 MG capsule, Take 1 capsule (500 mg total) by mouth 2 (two) times daily. (Patient not taking: Reported on 09/23/2018), Disp: 14 capsule, Rfl: 0 .  cyclobenzaprine (FLEXERIL) 10 MG tablet, cyclobenzaprine 10 mg tablet  Take 1 tablet(s) 3 times a day by oral route., Disp: , Rfl:  .  IRON PO, Take by mouth., Disp: , Rfl:  .  meloxicam (MOBIC) 15 MG tablet, Take 0.5-1 tablets (7.5-15 mg total) by mouth daily. (Patient not taking: Reported on 09/23/2018), Disp: 20 tablet,  Rfl: 0 .  montelukast (SINGULAIR) 10 MG tablet, Take 1 tablet (10 mg total) by mouth at bedtime. (Patient not taking: Reported on 01/22/2018), Disp: 30 tablet, Rfl: 3 .  NONFORMULARY OR COMPOUNDED ITEM, Apply 1-2 g topically 4 (four) times daily. (Patient not taking: Reported on 11/22/2017), Disp: 120 each, Rfl: 2 .  Omega-3 Fatty Acids (FISH OIL PO), Take by mouth as needed. , Disp: , Rfl:  .  predniSONE (DELTASONE) 10 MG tablet, Start with 3 pills tomorrow. Taper over the next 6 days.  3,3,2,2,1,1. (Patient not taking: Reported on 09/23/2018), Disp: 12 tablet,  Rfl: 0 .  SUPER B COMPLEX/C PO, Take daily by mouth., Disp: , Rfl:  .  traMADol (ULTRAM) 50 MG tablet, tramadol 50 mg tablet  Take 1 tablet every 6 hours by oral route as needed., Disp: , Rfl:  .  vitamin B-12 (CYANOCOBALAMIN) 1000 MCG tablet, Take 1,000 mcg by mouth daily. , Disp: , Rfl:   Allergies  Allergen Reactions  . Levofloxacin Hives  . Morphine Nausea Only  . Celecoxib Other (See Comments)    SOB  . Celexa [Citalopram Hydrobromide]   . Cymbalta [Duloxetine Hcl] Hives  . Gabapentin Other (See Comments)    anger  . Levaquin [Levofloxacin In D5w] Hives  . Valium [Diazepam] Other (See Comments)    hallucinations  . Monistat [Miconazole] Rash  . Prednisone Anxiety    ROS   No other specific complaints in a complete review of systems (except as listed in HPI above).  Objective  Vitals:   09/23/18 1049  BP: 124/72  Pulse: 74  Resp: 14  Temp: 97.9 F (36.6 C)  SpO2: 97%  Weight: 181 lb 14.4 oz (82.5 kg)  Height: 5\' 4"  (1.626 m)    Body mass index is 31.22 kg/m.  Nursing Note and Vital Signs reviewed.  Physical Exam Constitutional:      Appearance: Normal appearance. She is well-developed.  HENT:     Head: Normocephalic and atraumatic.     Right Ear: Hearing normal.     Left Ear: Hearing normal.  Eyes:     Conjunctiva/sclera: Conjunctivae normal.  Cardiovascular:     Rate and Rhythm: Normal rate and  regular rhythm.     Heart sounds: Normal heart sounds.  Pulmonary:     Effort: Pulmonary effort is normal.     Breath sounds: Normal breath sounds.  Musculoskeletal: Normal range of motion.     Left lower leg: She exhibits tenderness (varicose veins noted) and swelling (mildly larger than right leg). She exhibits no bony tenderness, no deformity and no laceration. No edema.  Neurological:     Mental Status: She is alert and oriented to person, place, and time.  Psychiatric:        Speech: Speech normal.        Behavior: Behavior normal. Behavior is cooperative.        Thought Content: Thought content normal.        Judgment: Judgment normal.     No results found for this or any previous visit (from the past 48 hour(s)).  Assessment & Plan  1. Pain of left calf Will check for DVT, consider msk sources, discussed red flag symptoms - US Venous Img Lower Unilateral Left; Future - meloxicam (MOBIC) 7.5 MG tablet; Take 1 tablet (7.5 mg total) by mouth daily as needed for pain.  Dispense: 30 tablet; Refill: 2  2. Varicose veins of left lower extremity with pain Posterior leg pain chronic and varicose veins are tender.  - Ambulatory referral to Vascular Surgery

## 2018-09-23 NOTE — Patient Instructions (Signed)
If you have not heard anything from my staff in the next month about any orders/referrals/studies from today, please contact us here to follow-up [phone]

## 2018-09-23 NOTE — Telephone Encounter (Signed)
Patient is calling with leg pain that started Saturday and has not gotten better- she is concerned because her pain in localizes to one area and is afraid she may have DVT. Call to office for appointment.  Reason for Disposition . [1] Thigh or calf pain AND [2] only 1 side AND [3] present > 1 hour  Answer Assessment - Initial Assessment Questions 1. ONSET: "When did the pain start?"      Saturday 2. LOCATION: "Where is the pain located?"      Left leg- between knee and ankle 3. PAIN: "How bad is the pain?"    (Scale 1-10; or mild, moderate, severe)   -  MILD (1-3): doesn't interfere with normal activities    -  MODERATE (4-7): interferes with normal activities (e.g., work or school) or awakens from sleep, limping    -  SEVERE (8-10): excruciating pain, unable to do any normal activities, unable to walk     Yes- 4-5 when touched 4. WORK OR EXERCISE: "Has there been any recent work or exercise that involved this part of the body?"      no 5. CAUSE: "What do you think is causing the leg pain?"     unsure 6. OTHER SYMPTOMS: "Do you have any other symptoms?" (e.g., chest pain, back pain, breathing difficulty, swelling, rash, fever, numbness, weakness)     Increase in acid reflux 7. PREGNANCY: "Is there any chance you are pregnant?" "When was your last menstrual period?"     n/a  Protocols used: LEG PAIN-A-AH

## 2018-10-17 ENCOUNTER — Other Ambulatory Visit: Payer: Self-pay

## 2018-10-17 ENCOUNTER — Ambulatory Visit (INDEPENDENT_AMBULATORY_CARE_PROVIDER_SITE_OTHER): Payer: Managed Care, Other (non HMO) | Admitting: Vascular Surgery

## 2018-10-17 ENCOUNTER — Encounter (INDEPENDENT_AMBULATORY_CARE_PROVIDER_SITE_OTHER): Payer: Self-pay | Admitting: Vascular Surgery

## 2018-10-17 ENCOUNTER — Telehealth (INDEPENDENT_AMBULATORY_CARE_PROVIDER_SITE_OTHER): Payer: Self-pay | Admitting: Vascular Surgery

## 2018-10-17 VITALS — BP 141/84 | HR 70 | Resp 16 | Ht 64.0 in | Wt 185.0 lb

## 2018-10-17 DIAGNOSIS — I8312 Varicose veins of left lower extremity with inflammation: Secondary | ICD-10-CM

## 2018-10-17 DIAGNOSIS — M1612 Unilateral primary osteoarthritis, left hip: Secondary | ICD-10-CM | POA: Diagnosis not present

## 2018-10-17 DIAGNOSIS — Z79899 Other long term (current) drug therapy: Secondary | ICD-10-CM

## 2018-10-17 DIAGNOSIS — I8311 Varicose veins of right lower extremity with inflammation: Secondary | ICD-10-CM

## 2018-10-17 DIAGNOSIS — K219 Gastro-esophageal reflux disease without esophagitis: Secondary | ICD-10-CM

## 2018-10-17 DIAGNOSIS — Z791 Long term (current) use of non-steroidal anti-inflammatories (NSAID): Secondary | ICD-10-CM

## 2018-10-18 ENCOUNTER — Encounter (INDEPENDENT_AMBULATORY_CARE_PROVIDER_SITE_OTHER): Payer: Self-pay | Admitting: Vascular Surgery

## 2018-10-18 NOTE — Progress Notes (Signed)
MRN : [id number]  Olivia Morgan is a 63 y.o. ([birth year]) female who presents with chief complaint of  Chief Complaint  Patient presents with  . New Patient (Initial Visit)    ref [facility] for varicose veins  .  History of Present Illness:   The patient is seen for evaluation of symptomatic varicose veins. The patient relates burning and stinging which worsened steadily throughout the course of the day, particularly with standing. The patient also notes an aching and throbbing pain over the varicosities, particularly with prolonged dependent positions. The symptoms are significantly improved with elevation.  The patient also notes that during hot weather the symptoms are greatly intensified. The patient states the pain from the varicose veins interferes with work, daily exercise, shopping and household maintenance. At this point, the symptoms are not severe enough that they're having a negative impact on lifestyle and are not interfering with daily activities.  There is no history of DVT, PE or superficial thrombophlebitis. There is no history of ulceration or hemorrhage. The patient denies a significant family history of varicose veins.  The patient has not worn graduated compression in the past. At the present time the patient has not been using over-the-counter analgesics. There is no history of prior surgical intervention or sclerotherapy.    Current Meds  Medication Sig  . buPROPion (WELLBUTRIN XL) 150 MG 24 hr tablet TAKE 3 TABLETS BY MOUTH  DAILY  . Cholecalciferol (VITAMIN D-3 PO) Take 1,000 Units by mouth once a week.   . diphenhydrAMINE (BENADRYL) 25 MG tablet Take 25 mg by mouth every 6 (six) hours as needed.  . meloxicam (MOBIC) 7.5 MG tablet Take 1 tablet (7.5 mg total) by mouth daily as needed for pain.  . RABEprazole (ACIPHEX) 20 MG tablet TAKE 1 TABLET BY MOUTH  DAILY. CAUTION: PROLONGED  USE MAY INCREASE RISK OF  PNEUMONIA, COLITIS,  OSTEOPOROSIS, ANEMIA  .  valACYclovir (VALTREX) 500 MG tablet TAKE 1 TABLET BY MOUTH  DAILY  . vitamin B-12 (CYANOCOBALAMIN) 1000 MCG tablet Take 1,000 mcg by mouth daily.   Marland Kitchen. VITAMIN E PO Take by mouth once a week.     Past Medical History:  Diagnosis Date  . Bilateral hearing loss    wears hearing aids  . Depression   . Genital herpes   . GERD (gastroesophageal reflux disease)   . Herpes simplex virus infection   . Murmur   . Neuropathy of both feet 06/05/2015  . OA (osteoarthritis) of hip    left  . Osteopenia of lumbar spine 2010  . Plantar fasciitis   . Stomatitis     Past Surgical History:  Procedure Laterality Date  . CESAREAN SECTION    . COLONOSCOPY  2010  . ENDOSCOPIC CONCHA BULLOSA RESECTION Left 05/23/2018   Procedure: ENDOSCOPIC CONCHA BULLOSA RESECTION;  Surgeon: Vernie MurdersJuengel, Paul, MD;  [facility];  Service: ENT;  Laterality: Left;  . FOOT SURGERY Right   . REPLACEMENT TOTAL KNEE  [facility]/13   [facility]  . SEPTOPLASTY Bilateral 05/23/2018   Procedure: SEPTOPLASTY;  Surgeon: Vernie MurdersJuengel, Paul, MD;  [facility];  Service: ENT;  Laterality: Bilateral;  . TOTAL HIP ARTHROPLASTY Left 10/2008  . TURBINATE REDUCTION Bilateral 05/23/2018   Procedure: INFERIOR TURBINATE REDUCTION;  Surgeon: Vernie MurdersJuengel, Paul, MD;  [facility];  Service: ENT;  Laterality: Bilateral;  . UPPER GI ENDOSCOPY  1992    Social History Social History   Tobacco Use  .  Smoking status: Never Smoker  . Smokeless tobacco: Never Used  Substance Use Topics  . Alcohol use: Yes    Comment: socially  . Drug use: No    Family History Family History  Problem Relation Age of Onset  . Aneurysm Mother   . Stroke Mother   . Heart disease Mother   . Heart disease Father 84  . Hypertension Father   . Lung disease Father   . Heart disease Paternal Grandfather   . Panic disorder Brother   . Milk intolerance Daughter   . Polycystic ovary syndrome Daughter   . Obesity  Brother   . Hyperlipidemia Brother   . Hypertension Brother   . Diabetes Brother   . Cancer Neg Hx   . COPD Neg Hx   . Breast cancer Neg Hx   No family history of bleeding/clotting disorders, porphyria or autoimmune disease   Allergies  Allergen Reactions  . Levofloxacin Hives  . Morphine Nausea Only  . Celecoxib Other (See Comments)    SOB  . Celexa [Citalopram Hydrobromide]   . Cymbalta [Duloxetine Hcl] Hives  . Gabapentin Other (See Comments)    anger  . Levaquin [Levofloxacin In D5w] Hives  . Valium [Diazepam] Other (See Comments)    hallucinations  . Monistat [Miconazole] Rash  . Prednisone Anxiety     REVIEW OF SYSTEMS (Negative unless checked)  Constitutional: [] Weight loss  [] Fever  [] Chills Cardiac: [] Chest pain   [] Chest pressure   [] Palpitations   [] Shortness of breath when laying flat   [] Shortness of breath with exertion. Vascular:  [] Pain in legs with walking   [] Pain in legs at rest  [] History of DVT   [] Phlebitis   [x] Swelling in legs   [x] Varicose veins   [] Non-healing ulcers Pulmonary:   [] Uses home oxygen   [] Productive cough   [] Hemoptysis   [] Wheeze  [] COPD   [] Asthma Neurologic:  [] Dizziness   [] Seizures   [] History of stroke   [] History of TIA  [] Aphasia   [] Vissual changes   [] Weakness or numbness in arm   [] Weakness or numbness in leg Musculoskeletal:   [] Joint swelling   [x] Joint pain   [] Low back pain Hematologic:  [] Easy bruising  [] Easy bleeding   [] Hypercoagulable state   [] Anemic Gastrointestinal:  [] Diarrhea   [] Vomiting  [x] Gastroesophageal reflux/heartburn   [] Difficulty swallowing. Genitourinary:  [] Chronic kidney disease   [] Difficult urination  [] Frequent urination   [] Blood in urine Skin:  [] Rashes   [] Ulcers  Psychological:  [] History of anxiety   []  History of major depression.  Physical Examination  Vitals:   10/17/18 1455  BP: (!) 141/84  Pulse: 70  Resp: 16  Weight: 185 lb (83.9 kg)  Height: 5\' 4"  (1.626 m)   Body mass  index is 31.76 kg/m. Gen: WD/WN, NAD Head: [place]/AT, No temporalis wasting.  Ear/Nose/Throat: Hearing grossly intact, nares w/o erythema or drainage, poor dentition Eyes: PER, EOMI, sclera nonicteric.  Neck: Supple, no masses.  No bruit or JVD.  Pulmonary:  Good air movement, clear to auscultation bilaterally, no use of accessory muscles.  Cardiac: RRR, normal S1, S2, no Murmurs. Vascular: scattered varicosities present bilaterally.  Mild venous stasis changes to the legs bilaterally.  2+ soft pitting edema Vessel Right Left  PT Palpable Palpable  DP Palpable Palpable  Gastrointestinal: soft, non-distended. No guarding/no peritoneal signs.  Musculoskeletal: M/S 5/5 throughout.  No deformity or atrophy.  Neurologic: CN 2-12 intact. Pain and light touch intact in extremities.  Symmetrical.  Speech is  fluent. Motor exam as listed above. Psychiatric: Judgment intact, Mood & affect appropriate for pt's clinical situation. Dermatologic: No rashes or ulcers noted.  No changes consistent with cellulitis. Lymph : No Cervical lymphadenopathy, no lichenification or skin changes of chronic lymphedema.  CBC Lab Results  Component Value Date   WBC 4.7 11/22/2017   HGB 15.4 11/22/2017   HCT 44.8 11/22/2017   MCV 95 11/22/2017   PLT 148 (L) 11/22/2017    BMET    Component Value Date/Time   NA 143 11/08/2016 1234   K 4.5 11/08/2016 1234   CL 104 11/08/2016 1234   CO2 26 11/08/2016 1234   GLUCOSE 86 11/22/2017 1002   BUN 12 11/08/2016 1234   CREATININE 0.97 11/22/2017 1002   CALCIUM 9.6 11/08/2016 1234   GFRNONAA 62 11/22/2017 1002   GFRAA 72 11/22/2017 1002   CrCl cannot be calculated (Patient's most recent lab result is older than the maximum 21 days allowed.).  COAG No results found for: INR, PROTIME  Radiology [place] Venous Img Lower Unilateral Left  Result Date: 09/23/2018 CLINICAL DATA:  Left lower extremity pain and edema for the past 3 days. Evaluate for DVT. EXAM: LEFT LOWER  EXTREMITY VENOUS DOPPLER ULTRASOUND TECHNIQUE: Gray-scale sonography with graded compression, as well as color Doppler and duplex ultrasound were performed to evaluate the lower extremity deep venous systems from the level of the common femoral vein and including the common femoral, femoral, profunda femoral, popliteal and calf veins including the posterior tibial, peroneal and gastrocnemius veins when visible. The superficial great saphenous vein was also interrogated. Spectral Doppler was utilized to evaluate flow at rest and with distal augmentation maneuvers in the common femoral, femoral and popliteal veins. COMPARISON:  None. FINDINGS: Contralateral Common Femoral Vein: Respiratory phasicity is normal and symmetric with the symptomatic side. No evidence of thrombus. Normal compressibility. Common Femoral Vein: No evidence of thrombus. Normal compressibility, respiratory phasicity and response to augmentation. Saphenofemoral Junction: No evidence of thrombus. Normal compressibility and flow on color Doppler imaging. Profunda Femoral Vein: No evidence of thrombus. Normal compressibility and flow on color Doppler imaging. Femoral Vein: No evidence of thrombus. Normal compressibility, respiratory phasicity and response to augmentation. Popliteal Vein: No evidence of thrombus. Normal compressibility, respiratory phasicity and response to augmentation. Calf Veins: No evidence of thrombus. Normal compressibility and flow on color Doppler imaging. Superficial Great Saphenous Vein: No evidence of thrombus. Normal compressibility. Venous Reflux:  None. Other Findings:  None. IMPRESSION: No evidence of DVT within the left lower extremity. Electronically Signed   By: Simonne ComeJohn  Watts M.D.   On: 09/23/2018 13:36      Assessment/Plan 1. Varicose veins of both lower extremities with inflammation Recommend:  The patient is complaining of varicose veins.    I have had a long discussion with the patient regarding   varicose veins and why they cause symptoms.  Patient will begin wearing graduated compression stockings on a daily basis, beginning first thing in the morning and removing them in the evening. The patient is instructed specifically not to sleep in the stockings.    The patient  will also begin using over-the-counter analgesics such as Motrin 600 mg po TID to help control the symptoms as needed.    In addition, behavioral modification including elevation during the day will be initiated, utilizing a recliner was recommended.  The patient is also instructed to continue exercising such as walking 4-5 times per week.  At this time the patient wishes to continue conservative therapy  and is not interested in more invasive treatments such as laser ablation and sclerotherapy.  The Patient will follow up PRN if the symptoms worsen.  2. Gastroesophageal reflux disease, esophagitis presence not specified Continue PPI as already ordered, this medication has been reviewed and there are no changes at this time.  Avoidence of caffeine and alcohol  Moderate elevation of the head of the bed   3. Primary osteoarthritis of left hip Continue NSAID medications as already ordered, these medications have been reviewed and there are no changes at this time.  Continued activity and therapy was stressed.     Levora DredgeGregory Meris Reede, MD  10/18/2018 9:47 AM

## 2018-10-22 ENCOUNTER — Other Ambulatory Visit: Payer: Self-pay | Admitting: Family Medicine

## 2018-10-30 DIAGNOSIS — I8311 Varicose veins of right lower extremity with inflammation: Secondary | ICD-10-CM | POA: Insufficient documentation

## 2018-11-05 ENCOUNTER — Other Ambulatory Visit: Payer: Self-pay | Admitting: Nurse Practitioner

## 2018-11-05 DIAGNOSIS — Z1231 Encounter for screening mammogram for malignant neoplasm of breast: Secondary | ICD-10-CM

## 2018-11-11 ENCOUNTER — Ambulatory Visit: Payer: Self-pay | Admitting: Nurse Practitioner

## 2018-11-11 NOTE — Telephone Encounter (Signed)
Made appt with Raquel Sarna for Wed

## 2018-11-11 NOTE — Telephone Encounter (Signed)
Can make appointment within the 1-2 weeks; if pain is persistent go to urgent care, if worsening or has more blood in stools go to ER.

## 2018-11-11 NOTE — Telephone Encounter (Addendum)
Pt. Reports she started having abdominal pain Saturaday. Thought she could be constipated, but took a laxative and had a bowel movement. Has pressure when voiding. Pain is below her belly button at her C-Section scar. No diarrhea or fever. Will forward for review. Pt. Called back and is passing small soft sections of stool with drops of blood mixed in.  Answer Assessment - Initial Assessment Questions 1. LOCATION: "Where does it hurt?"      Below belly button 2. RADIATION: "Does the pain shoot anywhere else?" (e.g., chest, back)     No 3. ONSET: "When did the pain begin?" (e.g., minutes, hours or days ago)      Saturday 4. SUDDEN: "Gradual or sudden onset?"     Gradual 5. PATTERN "Does the pain come and go, or is it constant?"    - If constant: "Is it getting better, staying the same, or worsening?"      (Note: Constant means the pain never goes away completely; most serious pain is constant and it progresses)     - If intermittent: "How long does it last?" "Do you have pain now?"     (Note: Intermittent means the pain goes away completely between bouts)     If lays still no pain 6. SEVERITY: "How bad is the pain?"  (e.g., Scale 1-10; mild, moderate, or severe)   - MILD (1-3): doesn't interfere with normal activities, abdomen soft and not tender to touch    - MODERATE (4-7): interferes with normal activities or awakens from sleep, tender to touch    - SEVERE (8-10): excruciating pain, doubled over, unable to do any normal activities      Now --  6 7. RECURRENT SYMPTOM: "Have you ever had this type of abdominal pain before?" If so, ask: "When was the last time?" and "What happened that time?"      No 8. CAUSE: "What do you think is causing the abdominal pain?"     Unsure 9. RELIEVING/AGGRAVATING FACTORS: "What makes it better or worse?" (e.g., movement, antacids, bowel movement)     More pressure when voiding 10. OTHER SYMPTOMS: "Has there been any vomiting, diarrhea, constipation, or  urine problems?"       Pressure when voids 11. PREGNANCY: "Is there any chance you are pregnant?" "When was your last menstrual period?"       No  Protocols used: ABDOMINAL PAIN - [facility]

## 2018-11-13 ENCOUNTER — Encounter: Payer: Self-pay | Admitting: Family Medicine

## 2018-11-13 ENCOUNTER — Ambulatory Visit (INDEPENDENT_AMBULATORY_CARE_PROVIDER_SITE_OTHER): Payer: Managed Care, Other (non HMO) | Admitting: Family Medicine

## 2018-11-13 ENCOUNTER — Other Ambulatory Visit: Payer: Self-pay

## 2018-11-13 DIAGNOSIS — K625 Hemorrhage of anus and rectum: Secondary | ICD-10-CM

## 2018-11-13 DIAGNOSIS — Z7189 Other specified counseling: Secondary | ICD-10-CM | POA: Diagnosis not present

## 2018-11-13 DIAGNOSIS — R1033 Periumbilical pain: Secondary | ICD-10-CM | POA: Diagnosis not present

## 2018-11-13 LAB — CBC WITH DIFFERENTIAL/PLATELET
Basophils Absolute: 0 10*3/uL (ref 0.0–0.2)
Basos: 0 %
EOS (ABSOLUTE): 0.1 10*3/uL (ref 0.0–0.4)
Eos: 2 %
Hematocrit: 45.8 % (ref 34.0–46.6)
Hemoglobin: 15.3 g/dL (ref 11.1–15.9)
Immature Grans (Abs): 0 10*3/uL (ref 0.0–0.1)
Immature Granulocytes: 0 %
Lymphocytes Absolute: 0.7 10*3/uL (ref 0.7–3.1)
Lymphs: 11 %
MCH: 30.8 pg (ref 26.6–33.0)
MCHC: 33.4 g/dL (ref 31.5–35.7)
MCV: 92 fL (ref 79–97)
Monocytes Absolute: 0.3 10*3/uL (ref 0.1–0.9)
Monocytes: 5 %
Neutrophils Absolute: 5.3 10*3/uL (ref 1.4–7.0)
Neutrophils: 82 %
Platelets: 152 10*3/uL (ref 150–450)
RBC: 4.97 x10E6/uL (ref 3.77–5.28)
RDW: 13.2 % (ref 11.7–15.4)
WBC: 6.4 10*3/uL (ref 3.4–10.8)

## 2018-11-13 LAB — COMPREHENSIVE METABOLIC PANEL
ALT: 10 IU/L (ref 0–32)
AST: 13 IU/L (ref 0–40)
Albumin/Globulin Ratio: 2 (ref 1.2–2.2)
Albumin: 4.5 g/dL (ref 3.8–4.8)
Alkaline Phosphatase: 83 IU/L (ref 39–117)
BUN/Creatinine Ratio: 11 — ABNORMAL LOW (ref 12–28)
BUN: 11 mg/dL (ref 8–27)
Bilirubin Total: 0.4 mg/dL (ref 0.0–1.2)
CO2: 26 mmol/L (ref 20–29)
Calcium: 9.8 mg/dL (ref 8.7–10.3)
Chloride: 102 mmol/L (ref 96–106)
Creatinine, Ser: 0.96 mg/dL (ref 0.57–1.00)
GFR calc Af Amer: 72 mL/min/{1.73_m2} (ref >59)
GFR calc non Af Amer: 63 mL/min/{1.73_m2} (ref >59)
Globulin, Total: 2.3 g/dL (ref 1.5–4.5)
Glucose: 111 mg/dL — ABNORMAL HIGH (ref 65–99)
Potassium: 4.1 mmol/L (ref 3.5–5.2)
Sodium: 143 mmol/L (ref 134–144)
Total Protein: 6.8 g/dL (ref 6.0–8.5)

## 2018-11-13 LAB — SPECIMEN STATUS REPORT

## 2018-11-13 NOTE — Addendum Note (Signed)
Addended by: Hubbard Hartshorn on: 11/13/2018 03:01 PM   Modules accepted: Orders

## 2018-11-13 NOTE — Progress Notes (Addendum)
Name: Olivia Morgan   MRN: [id number]    DOB: July 25, 1955   Date:11/13/2018       Progress Note  Subjective  Chief Complaint  Chief Complaint  Patient presents with  . Abdominal Pain    onset since saturday, with rectal bleeding brigth red    I connected with  Paul Halferesa A Parson  on 11/13/18 at  9:40 AM EDT by a video enabled telemedicine application and verified that I am speaking with the correct person using two identifiers.  I discussed the limitations of evaluation and management by telemedicine and the availability of in person appointments. The patient expressed understanding and agreed to proceed. Staff also discussed with the patient that there may be a patient responsible charge related to this service. Patient Location: Home Provider Location: Home Additional Individuals present: None  HPI  Pt presents with concern for abdominal pain that has been ongoing for 5 days - she is having pain in the central abdomen just above her c-section scar and just below the umbilicus. She started noticing blood in stool 3 days ago - the blood that she was seeing were reddish looking pieces and flecks.  She also notes a few episodes of "dripping blood"; she has not had any blood this morning.  She has tried stool softener and is not constipated and this did not provide any relief of her symptoms.  She does note she has been working from home and when she sits for a long period of time she will develop some pain in the lower ribcage.  She is having pain with any tensing/flexion of her abdominal muscles.  Has had a few episodes  She denies fevers/chills, nausea/vomiting, no constipation or diarrhea, no near-syncope or syncope, no palpitations.  She does note history of IBS.  Patient Active Problem List   Diagnosis Date Noted  . 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  . History of total hip replacement 03/05/2017  . Allergic reaction 02/12/2017  . Dermatitis  02/12/2017  . Allergic rhinitis 04/16/2016  . Presence of right artificial knee joint 12/16/2015  . Obesity (BMI 30.0-34.9) 11/18/2015  . Vitamin D deficiency 11/11/2015  . Preventative health care 11/11/2015  . Mass of left lower leg 09/07/2015  . Adverse drug reaction 06/13/2015  . Greater trochanteric bursitis of left hip 06/05/2015  . Hammer toe of right foot 06/05/2015  . Neuropathy of both feet 06/05/2015  . 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 Thrombocytopenia (HCC) 04/05/2015  . Esophageal reflux 03/19/2015  . Need for shingles vaccine 03/14/2015  . Lower back pain 03/10/2015  . Dysphagia 03/10/2015  . Herpes simplex virus infection   . OA (osteoarthritis) of hip   . GERD (gastroesophageal reflux disease)   . Plantar fasciitis   . Depression   . Genital herpes   . Bilateral hearing loss   . Abnormal ECG 09/09/2014  . Shortness of breath 09/09/2014  . Hip pain 09/09/2012  . Other mechanical complication of other internal orthopedic device, implant, and graft 09/09/2012  . Medial collateral ligament sprain of knee 11/13/2011  . Other and unspecified derangement of medial meniscus 06/13/2011  . Primary localized osteoarthrosis, lower leg 06/13/2011    Social History   Tobacco Use  . Smoking status: Never Smoker  . Smokeless tobacco: Never Used  Substance Use Topics  . Alcohol use: Yes    Comment: socially     Current Outpatient Medications:  .  buPROPion (WELLBUTRIN XL) 150  MG 24 hr tablet, TAKE 3 TABLETS BY MOUTH  DAILY, Disp: 270 tablet, Rfl: 3 .  Cholecalciferol (VITAMIN D-3 PO), Take 1,000 Units by mouth once a week. , Disp: , Rfl:  .  cyclobenzaprine (FLEXERIL) 10 MG tablet, cyclobenzaprine 10 mg tablet  Take 1 tablet(s) 3 times a day by oral route., Disp: , Rfl:  .  diphenhydrAMINE (BENADRYL) 25 MG tablet, Take 25 mg by mouth every 6 (six) hours as needed., Disp: , Rfl:  .  meloxicam (MOBIC) 7.5 MG tablet, Take 1 tablet (7.5 mg total) by  mouth daily as needed for pain., Disp: 30 tablet, Rfl: 2 .  Multiple Vitamin (MULTIVITAMIN) tablet, Take 1 tablet by mouth daily., Disp: , Rfl:  .  Omega-3 Fatty Acids (FISH OIL PO), Take by mouth as needed. , Disp: , Rfl:  .  RABEprazole (ACIPHEX) 20 MG tablet, TAKE 1 TABLET BY MOUTH  DAILY. CAUTION: PROLONGED  USE MAY INCREASE RISK OF  PNEUMONIA, COLITIS,  OSTEOPOROSIS, ANEMIA, Disp: 90 tablet, Rfl: 0 .  SUPER B COMPLEX/C PO, Take daily by mouth., Disp: , Rfl:  .  traMADol (ULTRAM) 50 MG tablet, tramadol 50 mg tablet  Take 1 tablet every 6 hours by oral route as needed., Disp: , Rfl:  .  triamcinolone (NASACORT ALLERGY 24HR) 55 MCG/ACT AERO nasal inhaler, Place 2 sprays into the nose daily., Disp: , Rfl:  .  valACYclovir (VALTREX) 500 MG tablet, TAKE 1 TABLET BY MOUTH  DAILY, Disp: 90 tablet, Rfl: 3 .  vitamin B-12 (CYANOCOBALAMIN) 1000 MCG tablet, Take 1,000 mcg by mouth daily. , Disp: , Rfl:  .  VITAMIN E PO, Take by mouth once a week. , Disp: , Rfl:   Allergies  Allergen Reactions  . Levofloxacin Hives  . Morphine Nausea Only  . Celecoxib Other (See Comments)    SOB  . Celexa [Citalopram Hydrobromide]   . Cymbalta [Duloxetine Hcl] Hives  . Gabapentin Other (See Comments)    anger  . Levaquin [Levofloxacin In D5w] Hives  . Valium [Diazepam] Other (See Comments)    hallucinations  . Monistat [Miconazole] Rash  . Prednisone Anxiety    I personally reviewed active problem list, medication list, allergies, notes from last encounter, lab results with the patient/caregiver today.  ROS  Ten systems reviewed and is negative except as mentioned in HPI  Objective  Virtual encounter, vitals not obtained.  There is no height or weight on file to calculate BMI.  Nursing Note and Vital Signs reviewed.  Physical Exam  Constitutional: Patient appears well-developed and well-nourished. No distress.  HENT: Head: Normocephalic and atraumatic.  Neck: Normal range of motion.  Pulmonary/Chest: Effort normal. No respiratory distress. Speaking in complete sentences Neurological: Pt is alert and oriented to person, place, and time. Coordination, speech and gait are normal.  Psychiatric: Patient has a normal mood and affect. behavior is normal. Judgment and thought content normal. Abdominal: She endorses tenderness just under the umbilicus with self-palpation  No results found for this or any previous visit (from the past 72 hour(s)).  Assessment & Plan  1. Periumbilical abdominal pain - CBC w/Diff/Platelet - Comprehensive metabolic panel  2. BRBPR (bright red blood per rectum) - CBC w/Diff/Platelet - Comprehensive metabolic panel  She'd like to wait on CT scan until labs are returned; will consider GI referral vs imaging after labs are back.  She is going immediately for labs this morning. Advised if anything worsens or near syncope/lightheadedness/dizziness, needs to call us back or go to  [facility] for evaluation.  -Red flags and when to present for emergency care or RTC including fever >101.86F, chest pain, shortness of breath, new/worsening/un-resolving symptoms,  reviewed with patient at time of visit. Follow up and care instructions discussed and provided in AVS. - I discussed the assessment and treatment plan with the patient. The patient was provided an opportunity to ask questions and all were answered. The patient agreed with the plan and demonstrated an understanding of the instructions.  I provided 18 minutes of non-face-to-face time during this encounter.  Hubbard Hartshorn, FNP  Addendum: Called patient at 1455: [place] to clinic - CBC WNL, CMP shows Glucose 111, BUN slightly low at 11, otherwise WNL.  Discussed in detail, we will refer non-urgently to GI, however if bleeding recurs, she will call and we will increase priority to urgent.   She also asks that she be tested for COVID-19 antibody - has not had symptoms or exposure, but states  [facility] is trying to build database of employee screening for research and she'd like to participate.  Explained the lack of research behind this test, that we do not recommend it as a diagnostic tool for past or present infection, and that it is not a guarantee for immunity should her IgG be positive.  She verbalizes understanding and would like to proceed.  This order is placed for her.

## 2018-11-21 ENCOUNTER — Telehealth: Payer: Self-pay | Admitting: Gastroenterology

## 2018-11-21 ENCOUNTER — Ambulatory Visit: Payer: Managed Care, Other (non HMO) | Admitting: Gastroenterology

## 2018-11-21 ENCOUNTER — Other Ambulatory Visit: Payer: Self-pay

## 2018-11-21 ENCOUNTER — Encounter: Payer: Self-pay | Admitting: Gastroenterology

## 2018-11-21 VITALS — BP 127/82 | HR 78 | Temp 97.9°F | Resp 17 | Ht 64.0 in | Wt 185.0 lb

## 2018-11-21 DIAGNOSIS — K625 Hemorrhage of anus and rectum: Secondary | ICD-10-CM

## 2018-11-21 NOTE — Telephone Encounter (Signed)
Patient called & needs to r/s her colonoscopy scheduled for 11-27-18 because she is going to be out of town & not able to do the covid test.

## 2018-11-21 NOTE — Progress Notes (Signed)
Arlyss Repressohini R , MD [address]  [address]  [place], [place] [address]  Main: [phone]  Fax: [phone]    Gastroenterology Consultation  Referring Provider:     Doren CustardBoyce, Emily E, FNP Primary Care Physician:  Cheryle HorsfallPoulose, Elizabeth E, NP Primary Gastroenterologist:  Dr. Arlyss Repressohini R  Reason for Consultation:     Rectal bleeding        HPI:   Olivia Morgan is a 63 y.o. female referred by Dr. Cheryle HorsfallPoulose, Elizabeth E, NP  for consultation & management of rectal bleeding.  Patient reports that she had an episode of bright red blood per rectum associated with lower abdominal cramps that occurred last week, lasted for a day.  She denies having diarrhea or constipation.  Her initial episode was with large amount of blood, some dripping and followed by just blood on wiping.  She was seen by Maurice SmallEmily Boyce hemoglobin was normal.  She is referred to GI for further evaluation.  Rectal bleeding has subsided.  Patient does not have any GI symptoms currently  NSAIDs: None  Antiplts/Anticoagulants/Anti thrombotics: None  GI Procedures:  EGD 03/24/2015 by Dr. Lemar LivingsByrnett for suspected GERD   Colonoscopy 04/05/2009, normal  Past Medical History:  Diagnosis Date  . Bilateral hearing loss    wears hearing aids  . Depression   . Genital herpes   . GERD (gastroesophageal reflux disease)   . Herpes simplex virus infection   . Murmur   . Neuropathy of both feet 06/05/2015  . OA (osteoarthritis) of hip    left  . Osteopenia of lumbar spine 2010  . Plantar fasciitis   . Stomatitis     Past Surgical History:  Procedure Laterality Date  . CESAREAN SECTION    . COLONOSCOPY  2010  . ENDOSCOPIC CONCHA BULLOSA RESECTION Left 05/23/2018   Procedure: ENDOSCOPIC CONCHA BULLOSA RESECTION;  Surgeon: Vernie MurdersJuengel, Paul, MD;  [facility];  Service: ENT;  Laterality: Left;  . FOOT SURGERY Right   . REPLACEMENT TOTAL KNEE  [facility]  . SEPTOPLASTY Bilateral  05/23/2018   Procedure: SEPTOPLASTY;  Surgeon: Vernie MurdersJuengel, Paul, MD;  [facility];  Service: ENT;  Laterality: Bilateral;  . TOTAL HIP ARTHROPLASTY Left 10/2008  . TURBINATE REDUCTION Bilateral 05/23/2018   Procedure: INFERIOR TURBINATE REDUCTION;  Surgeon: Vernie MurdersJuengel, Paul, MD;  [facility];  Service: ENT;  Laterality: Bilateral;  . UPPER GI ENDOSCOPY  1992    Current Outpatient Medications:  .  buPROPion (WELLBUTRIN XL) 150 MG 24 hr tablet, TAKE 3 TABLETS BY MOUTH  DAILY, Disp: 270 tablet, Rfl: 3 .  Cholecalciferol (VITAMIN D-3 PO), Take 1,000 Units by mouth once a week. , Disp: , Rfl:  .  diphenhydrAMINE (BENADRYL) 25 MG tablet, Take 25 mg by mouth every 6 (six) hours as needed., Disp: , Rfl:  .  meloxicam (MOBIC) 7.5 MG tablet, Take 1 tablet (7.5 mg total) by mouth daily as needed for pain., Disp: 30 tablet, Rfl: 2 .  RABEprazole (ACIPHEX) 20 MG tablet, TAKE 1 TABLET BY MOUTH  DAILY. CAUTION: PROLONGED  USE MAY INCREASE RISK OF  PNEUMONIA, COLITIS,  OSTEOPOROSIS, ANEMIA, Disp: 90 tablet, Rfl: 0 .  triamcinolone (NASACORT ALLERGY 24HR) 55 MCG/ACT AERO nasal inhaler, Place 2 sprays into the nose daily., Disp: , Rfl:  .  valACYclovir (VALTREX) 500 MG tablet, TAKE 1 TABLET BY MOUTH  DAILY, Disp: 90 tablet, Rfl: 3 .  VITAMIN E PO, Take by mouth once  a week. , Disp: , Rfl:  .  cyclobenzaprine (FLEXERIL) 10 MG tablet, cyclobenzaprine 10 mg tablet  Take 1 tablet(s) 3 times a day by oral route., Disp: , Rfl:  .  Multiple Vitamin (MULTIVITAMIN) tablet, Take 1 tablet by mouth daily., Disp: , Rfl:  .  Omega-3 Fatty Acids (FISH OIL PO), Take by mouth as needed. , Disp: , Rfl:  .  SUPER B COMPLEX/C PO, Take daily by mouth., Disp: , Rfl:  .  traMADol (ULTRAM) 50 MG tablet, tramadol 50 mg tablet  Take 1 tablet every 6 hours by oral route as needed., Disp: , Rfl:  .  vitamin B-12 (CYANOCOBALAMIN) 1000 MCG tablet, Take 1,000 mcg by mouth daily. , Disp: , Rfl:    Family History   Problem Relation Age of Onset  . Aneurysm Mother   . Stroke Mother   . Heart disease Mother   . Heart disease Father 57  . Hypertension Father   . Lung disease Father   . Heart disease Paternal Grandfather   . Panic disorder Brother   . Milk intolerance Daughter   . Polycystic ovary syndrome Daughter   . Obesity Brother   . Hyperlipidemia Brother   . Hypertension Brother   . Diabetes Brother   . Cancer Neg Hx   . COPD Neg Hx   . Breast cancer Neg Hx      Social History   Tobacco Use  . Smoking status: Never Smoker  . Smokeless tobacco: Never Used  Substance Use Topics  . Alcohol use: Yes    Comment: socially  . Drug use: No    Allergies as of 11/21/2018 - Review Complete 11/21/2018  Allergen Reaction Noted  . Levofloxacin Hives   . Morphine Nausea Only 11/18/2015  . Celecoxib Other (See Comments) 03/10/2015  . Celexa [citalopram hydrobromide]  03/03/2015  . Cymbalta [duloxetine hcl] Hives 03/03/2015  . Gabapentin Other (See Comments) 06/11/2015  . Levaquin [levofloxacin in d5w] Hives 03/03/2015  . Valium [diazepam] Other (See Comments) 03/03/2015  . Monistat [miconazole] Rash 03/03/2015  . Prednisone Anxiety 03/03/2015    Review of Systems:    All systems reviewed and negative except where noted in HPI.   Physical Exam:  BP 127/82 (BP Location: Left Arm, Patient Position: Sitting, Cuff Size: Large)   Pulse 78   Temp 97.9 F (36.6 C)   Resp 17   Ht 5\' 4"  (1.626 m)   Wt 185 lb (83.9 kg)   BMI 31.76 kg/m  No LMP recorded. Patient is postmenopausal.  General:   Alert,  Well-developed, well-nourished, pleasant and cooperative in NAD Head:  Normocephalic and atraumatic. Eyes:  Sclera clear, no icterus.   Conjunctiva pink. Ears:  Normal auditory acuity. Nose:  No deformity, discharge, or lesions. Mouth:  No deformity or lesions,oropharynx pink & moist. Neck:  Supple; no masses or thyromegaly. Lungs:  Respirations even and unlabored.  Clear throughout to  auscultation.   No wheezes, crackles, or rhonchi. No acute distress. Heart:  Regular rate and rhythm; no murmurs, clicks, rubs, or gallops. Abdomen:  Normal bowel sounds. Soft, non-tender and non-distended without masses, hepatosplenomegaly or hernias noted.  No guarding or rebound tenderness.   Rectal: Not performed Msk:  Symmetrical without gross deformities. Good, equal movement & strength bilaterally. Pulses:  Normal pulses noted. Extremities:  No clubbing or edema.  No cyanosis. Neurologic:  Alert and oriented x3;  grossly normal neurologically. Skin:  Intact without significant lesions or rashes. No jaundice. Lymph Nodes:  No significant cervical adenopathy. Psych:  Alert and cooperative. Normal mood and affect.  Imaging Studies: None  Assessment and Plan:   Olivia Morgan is a 63 y.o. female with morbid obesity, GERD on PPI seen in consultation for rectal bleeding.  Most likely bleeding from internal hemorrhoids versus diverticular or malignancy. Recommend diagnostic colonoscopy to evaluate for colon cancer  I have discussed alternative options, risks & benefits,  which include, but are not limited to, bleeding, infection, perforation,respiratory complication & drug reaction.  The patient agrees with this plan & written consent will be obtained.     Follow up based on the colonoscopy results   Arlyss Repressohini R , MD

## 2018-11-22 NOTE — Telephone Encounter (Signed)
Kim from [facility] surgery is calling to inform us that pt will do her Covid19 test on Monday and can still keep apt as is.

## 2018-11-25 ENCOUNTER — Other Ambulatory Visit
Admission: RE | Admit: 2018-11-25 | Discharge: 2018-11-25 | Disposition: A | Discharge status: Home / Self Care | Payer: Managed Care, Other (non HMO) | Source: Ambulatory Visit | Attending: Gastroenterology | Admitting: Gastroenterology

## 2018-11-25 ENCOUNTER — Encounter: Payer: Self-pay | Admitting: *Deleted

## 2018-11-25 ENCOUNTER — Other Ambulatory Visit: Payer: Self-pay

## 2018-11-25 DIAGNOSIS — Z20828 Contact with and (suspected) exposure to other viral communicable diseases: Secondary | ICD-10-CM | POA: Insufficient documentation

## 2018-11-25 DIAGNOSIS — Z01812 Encounter for preprocedural laboratory examination: Secondary | ICD-10-CM | POA: Diagnosis present

## 2018-11-25 LAB — SARS CORONAVIRUS 2 (TAT 6-24 HRS): SARS Coronavirus 2: NEGATIVE

## 2018-11-26 NOTE — Discharge Instructions (Signed)

## 2018-11-27 ENCOUNTER — Ambulatory Visit: Payer: Managed Care, Other (non HMO) | Admitting: Anesthesiology

## 2018-11-27 ENCOUNTER — Encounter
Admission: RE | Disposition: A | Discharge status: Home / Self Care | Payer: Self-pay | Source: Home / Self Care | Attending: Gastroenterology

## 2018-11-27 ENCOUNTER — Ambulatory Visit
Admission: RE | Admit: 2018-11-27 | Discharge: 2018-11-27 | Disposition: A | Discharge status: Home / Self Care | Payer: Managed Care, Other (non HMO) | Attending: Gastroenterology | Admitting: Gastroenterology

## 2018-11-27 ENCOUNTER — Other Ambulatory Visit: Payer: Self-pay

## 2018-11-27 DIAGNOSIS — K219 Gastro-esophageal reflux disease without esophagitis: Secondary | ICD-10-CM | POA: Insufficient documentation

## 2018-11-27 DIAGNOSIS — Z881 Allergy status to other antibiotic agents status: Secondary | ICD-10-CM | POA: Insufficient documentation

## 2018-11-27 DIAGNOSIS — M1612 Unilateral primary osteoarthritis, left hip: Secondary | ICD-10-CM | POA: Insufficient documentation

## 2018-11-27 DIAGNOSIS — A6000 Herpesviral infection of urogenital system, unspecified: Secondary | ICD-10-CM | POA: Insufficient documentation

## 2018-11-27 DIAGNOSIS — M8588 Other specified disorders of bone density and structure, other site: Secondary | ICD-10-CM | POA: Insufficient documentation

## 2018-11-27 DIAGNOSIS — G629 Polyneuropathy, unspecified: Secondary | ICD-10-CM | POA: Insufficient documentation

## 2018-11-27 DIAGNOSIS — K5520 Angiodysplasia of colon without hemorrhage: Secondary | ICD-10-CM | POA: Diagnosis not present

## 2018-11-27 DIAGNOSIS — Z79899 Other long term (current) drug therapy: Secondary | ICD-10-CM | POA: Diagnosis not present

## 2018-11-27 DIAGNOSIS — K644 Residual hemorrhoidal skin tags: Secondary | ICD-10-CM | POA: Insufficient documentation

## 2018-11-27 DIAGNOSIS — Z886 Allergy status to analgesic agent status: Secondary | ICD-10-CM | POA: Insufficient documentation

## 2018-11-27 DIAGNOSIS — G709 Myoneural disorder, unspecified: Secondary | ICD-10-CM | POA: Diagnosis not present

## 2018-11-27 DIAGNOSIS — Z885 Allergy status to narcotic agent status: Secondary | ICD-10-CM | POA: Insufficient documentation

## 2018-11-27 DIAGNOSIS — F329 Major depressive disorder, single episode, unspecified: Secondary | ICD-10-CM | POA: Diagnosis not present

## 2018-11-27 DIAGNOSIS — Z888 Allergy status to other drugs, medicaments and biological substances status: Secondary | ICD-10-CM | POA: Diagnosis not present

## 2018-11-27 DIAGNOSIS — K625 Hemorrhage of anus and rectum: Secondary | ICD-10-CM

## 2018-11-27 HISTORY — PX: COLONOSCOPY WITH PROPOFOL: SHX5780

## 2018-11-27 SURGERY — COLONOSCOPY WITH PROPOFOL
Anesthesia: General

## 2018-11-27 MED ORDER — SODIUM CHLORIDE 0.9 % IV SOLN: INTRAVENOUS | Status: DC

## 2018-11-27 MED ORDER — STERILE WATER FOR IRRIGATION IR SOLN
Status: DC | PRN
  Administered 2018-11-27: 100 mL

## 2018-11-27 MED ORDER — LACTATED RINGERS IV SOLN
INTRAVENOUS | Status: DC | PRN
  Administered 2018-11-27: 10:00:00 via INTRAVENOUS

## 2018-11-27 MED ORDER — LIDOCAINE HCL (CARDIAC) PF 100 MG/5ML IV SOSY
PREFILLED_SYRINGE | INTRAVENOUS | Status: DC | PRN
  Administered 2018-11-27: 30 mg via INTRAVENOUS

## 2018-11-27 MED ORDER — PROPOFOL 10 MG/ML IV BOLUS
INTRAVENOUS | Status: DC | PRN
  Administered 2018-11-27 (×3): 30 mg via INTRAVENOUS
  Administered 2018-11-27: 40 mg via INTRAVENOUS
  Administered 2018-11-27: 140 mg via INTRAVENOUS
  Administered 2018-11-27: 30 mg via INTRAVENOUS
  Administered 2018-11-27: 40 mg via INTRAVENOUS

## 2018-11-27 MED ORDER — ACETAMINOPHEN 160 MG/5ML PO SOLN: 325.0000 mg | Freq: Once | ORAL | Status: DC

## 2018-11-27 MED ORDER — ACETAMINOPHEN 325 MG PO TABS: 325.0000 mg | ORAL_TABLET | Freq: Once | ORAL | Status: DC

## 2018-11-27 SURGICAL SUPPLY — 25 items
CANISTER SUCT 1200ML W/VALVE (MISCELLANEOUS) ×2 IMPLANT
CLIP HMST 235XBRD CATH ROT (MISCELLANEOUS) IMPLANT
CLIP RESOLUTION 360 11X235 (MISCELLANEOUS)
ELECT REM PT RETURN 9FT ADLT (ELECTROSURGICAL)
ELECTRODE REM PT RTRN 9FT ADLT (ELECTROSURGICAL) IMPLANT
FCP ESCP3.2XJMB 240X2.8X (MISCELLANEOUS)
FORCEPS BIOP RAD 4 LRG CAP 4 (CUTTING FORCEPS) IMPLANT
FORCEPS BIOP RJ4 240 W/NDL (MISCELLANEOUS)
FORCEPS ESCP3.2XJMB 240X2.8X (MISCELLANEOUS) IMPLANT
GOWN CVR UNV OPN BCK APRN NK (MISCELLANEOUS) ×2 IMPLANT
GOWN ISOL THUMB LOOP REG UNIV (MISCELLANEOUS) ×2
INJECTOR VARIJECT VIN23 (MISCELLANEOUS) IMPLANT
KIT DEFENDO VALVE AND CONN (KITS) IMPLANT
KIT ENDO PROCEDURE OLY (KITS) ×2 IMPLANT
MARKER SPOT ENDO TATTOO 5ML (MISCELLANEOUS) IMPLANT
PROBE APC STR FIRE (PROBE) IMPLANT
RETRIEVER NET ROTH 2.5X230 LF (MISCELLANEOUS) IMPLANT
SNARE COLD EXACTO (MISCELLANEOUS) IMPLANT
SNARE SHORT THROW 13M SML OVAL (MISCELLANEOUS) IMPLANT
SNARE SHORT THROW 30M LRG OVAL (MISCELLANEOUS) IMPLANT
SNARE SNG USE RND 15MM (INSTRUMENTS) IMPLANT
SPOT EX ENDOSCOPIC TATTOO (MISCELLANEOUS)
TRAP ETRAP POLY (MISCELLANEOUS) IMPLANT
VARIJECT INJECTOR VIN23 (MISCELLANEOUS)
WATER STERILE IRR 250ML POUR (IV SOLUTION) ×2 IMPLANT

## 2018-11-27 NOTE — Anesthesia Postprocedure Evaluation (Signed)
Anesthesia Post Note  Patient: Olivia Morgan  Procedure(s) Performed: COLONOSCOPY WITH PROPOFOL (N/A )  Patient location during evaluation: PACU Anesthesia Type: General Level of consciousness: awake and alert and oriented Pain management: satisfactory to patient Vital Signs Assessment: post-procedure vital signs reviewed and stable Respiratory status: spontaneous breathing, nonlabored ventilation and respiratory function stable Cardiovascular status: blood pressure returned to baseline and stable Postop Assessment: Adequate PO intake and No signs of nausea or vomiting Anesthetic complications: no    Raliegh Ip

## 2018-11-27 NOTE — Transfer of Care (Signed)
Immediate Anesthesia Transfer of Care Note  Patient: Olivia Morgan  Procedure(s) Performed: COLONOSCOPY WITH PROPOFOL (N/A )  Patient Location: PACU  Anesthesia Type: General  Level of Consciousness: awake, alert  and patient cooperative  Airway and Oxygen Therapy: Patient Spontanous Breathing and Patient connected to supplemental oxygen  Post-op Assessment: Post-op Vital signs reviewed, Patient's Cardiovascular Status Stable, Respiratory Function Stable, Patent Airway and No signs of Nausea or vomiting  Post-op Vital Signs: Reviewed and stable  Complications: No apparent anesthesia complications

## 2018-11-27 NOTE — H&P (Signed)
Arlyss Repressohini R Roopa Graver, MD [address]  [address]  [place], [place] [address]  Main: [phone]  Fax: [phone] Pager: [phone]  Primary Care Physician:  Cheryle HorsfallPoulose, Elizabeth E, NP Primary Gastroenterologist:  Dr. Arlyss Repressohini R Irwin Toran  Pre-Procedure History & Physical: HPI:  Olivia Morgan is a 63 y.o. female is here for an colonoscopy.   Past Medical History:  Diagnosis Date  . Bilateral hearing loss    wears hearing aids  . Depression   . Genital herpes   . GERD (gastroesophageal reflux disease)   . Herpes simplex virus infection   . Murmur   . Neuropathy of both feet 06/05/2015  . OA (osteoarthritis) of hip    left  . Osteopenia of lumbar spine 2010  . Plantar fasciitis   . Stomatitis     Past Surgical History:  Procedure Laterality Date  . CESAREAN SECTION    . COLONOSCOPY  2010  . ENDOSCOPIC CONCHA BULLOSA RESECTION Left 05/23/2018   Procedure: ENDOSCOPIC CONCHA BULLOSA RESECTION;  Surgeon: Vernie MurdersJuengel, Paul, MD;  [facility];  Service: ENT;  Laterality: Left;  . FOOT SURGERY Right   . REPLACEMENT TOTAL KNEE  3/13   [facility]  . SEPTOPLASTY Bilateral 05/23/2018   Procedure: SEPTOPLASTY;  Surgeon: Vernie MurdersJuengel, Paul, MD;  [facility];  Service: ENT;  Laterality: Bilateral;  . TOTAL HIP ARTHROPLASTY Left 10/2008  . TURBINATE REDUCTION Bilateral 05/23/2018   Procedure: INFERIOR TURBINATE REDUCTION;  Surgeon: Vernie MurdersJuengel, Paul, MD;  [facility];  Service: ENT;  Laterality: Bilateral;  . UPPER GI ENDOSCOPY  1992    Prior to Admission medications   Medication Sig Start Date End Date Taking? Authorizing Provider  acetaminophen (TYLENOL) 500 MG tablet Take 500 mg by mouth every 6 (six) hours as needed.   Yes [provider]  buPROPion (WELLBUTRIN XL) 150 MG 24 hr tablet TAKE 3 TABLETS BY MOUTH  DAILY 01/25/18  Yes Lada, Janit BernMelinda P, MD  Cholecalciferol (VITAMIN D-3 PO) Take 1,000 Units by mouth once a  week.    Yes [provider]  diphenhydrAMINE (BENADRYL) 25 MG tablet Take 25 mg by mouth every 6 (six) hours as needed.   Yes [provider]  Multiple Vitamin (MULTIVITAMIN) tablet Take 1 tablet by mouth daily.   Yes [provider]  RABEprazole (ACIPHEX) 20 MG tablet TAKE 1 TABLET BY MOUTH  DAILY. CAUTION: PROLONGED  USE MAY INCREASE RISK OF  PNEUMONIA, COLITIS,  OSTEOPOROSIS, ANEMIA 10/22/18  Yes Doren CustardBoyce, Emily E, FNP  triamcinolone (NASACORT ALLERGY 24HR) 55 MCG/ACT AERO nasal inhaler Place 2 sprays into the nose daily.   Yes [provider]  valACYclovir (VALTREX) 500 MG tablet TAKE 1 TABLET BY MOUTH  DAILY 07/31/18  Yes Lada, Janit BernMelinda P, MD  VITAMIN E PO Take by mouth once a week.    Yes [provider]  cyclobenzaprine (FLEXERIL) 10 MG tablet cyclobenzaprine 10 mg tablet  Take 1 tablet(s) 3 times a day by oral route.    [provider]  meloxicam (MOBIC) 7.5 MG tablet Take 1 tablet (7.5 mg total) by mouth daily as needed for pain. Patient not taking: Reported on 11/25/2018 09/23/18   Cheryle HorsfallPoulose, Elizabeth E, NP  traMADol (ULTRAM) 50 MG tablet tramadol 50 mg tablet  Take 1 tablet every 6 hours by oral route as needed.    [provider]    Allergies as of 11/21/2018 - Review Complete 11/21/2018  Allergen Reaction Noted  . Levofloxacin  Hives   . Morphine Nausea Only 11/18/2015  . Celecoxib Other (See Comments) 03/10/2015  . Celexa [citalopram hydrobromide]  03/03/2015  . Cymbalta [duloxetine hcl] Hives 03/03/2015  . Gabapentin Other (See Comments) 06/11/2015  . Levaquin [levofloxacin in d5w] Hives 03/03/2015  . Valium [diazepam] Other (See Comments) 03/03/2015  . Monistat [miconazole] Rash 03/03/2015  . Prednisone Anxiety 03/03/2015    Family History  Problem Relation Age of Onset  . Aneurysm Mother   . Stroke Mother   . Heart disease Mother   . Heart disease Father 28  . Hypertension Father   . Lung disease Father   .  Heart disease Paternal Grandfather   . Panic disorder Brother   . Milk intolerance Daughter   . Polycystic ovary syndrome Daughter   . Obesity Brother   . Hyperlipidemia Brother   . Hypertension Brother   . Diabetes Brother   . Cancer Neg Hx   . COPD Neg Hx   . Breast cancer Neg Hx     Social History   Socioeconomic History  . Marital status: Divorced    Spouse name: Not on file  . Number of children: Not on file  . Years of education: Not on file  . Highest education level: Not on file  Occupational History  . Not on file  Social Needs  . Financial resource strain: Not on file  . Food insecurity    Worry: Not on file    Inability: Not on file  . Transportation needs    Medical: Not on file    Non-medical: Not on file  Tobacco Use  . Smoking status: Never Smoker  . Smokeless tobacco: Never Used  Substance and Sexual Activity  . Alcohol use: Yes    Comment: socially  . Drug use: No  . Sexual activity: Yes    Birth control/protection: Post-menopausal  Lifestyle  . Physical activity    Days per week: Not on file    Minutes per session: Not on file  . Stress: Not on file  Relationships  . Social Herbalist on phone: Not on file    Gets together: Not on file    Attends religious service: Not on file    Active member of club or organization: Not on file    Attends meetings of clubs or organizations: Not on file    Relationship status: Not on file  . Intimate partner violence    Fear of current or ex partner: Not on file    Emotionally abused: Not on file    Physically abused: Not on file    Forced sexual activity: Not on file  Other Topics Concern  . Not on file  Social History Narrative  . Not on file    Review of Systems: See HPI, otherwise negative ROS  Physical Exam: BP 119/79   Pulse 73   Temp 97.8 F (36.6 C) (Temporal)   Ht 5\' 4"  (1.626 m)   Wt 82.6 kg   SpO2 97%   BMI 31.24 kg/m  General:   Alert,  pleasant and cooperative in  NAD Head:  Normocephalic and atraumatic. Neck:  Supple; no masses or thyromegaly. Lungs:  Clear throughout to auscultation.    Heart:  Regular rate and rhythm. Abdomen:  Soft, nontender and nondistended. Normal bowel sounds, without guarding, and without rebound.   Neurologic:  Alert and  oriented x4;  grossly normal neurologically.  Impression/Plan: ARZU MCGAUGHEY is here for an colonoscopy to  be performed for rectal bleeding  Risks, benefits, limitations, and alternatives regarding  colonoscopy have been reviewed with the patient.  Questions have been answered.  All parties agreeable.   Lannette Donathohini Tex Conroy, MD  11/27/2018, 9:25 AM

## 2018-11-27 NOTE — Anesthesia Preprocedure Evaluation (Signed)
Anesthesia Evaluation  Patient identified by MRN, date of birth, ID band Patient awake    Reviewed: Allergy & Precautions, H&P , NPO status , Patient's Chart, lab work & pertinent test results  Airway Mallampati: II  TM Distance: >3 FB Neck ROM: full    Dental no notable dental hx.    Pulmonary    Pulmonary exam normal breath sounds clear to auscultation       Cardiovascular Normal cardiovascular exam Rhythm:regular Rate:Normal     Neuro/Psych PSYCHIATRIC DISORDERS  Neuromuscular disease    GI/Hepatic GERD  ,  Endo/Other    Renal/GU      Musculoskeletal   Abdominal   Peds  Hematology   Anesthesia Other Findings Possible difficult airway  Reproductive/Obstetrics                             Anesthesia Physical  Anesthesia Plan  ASA: II  Anesthesia Plan: General   Post-op Pain Management:    Induction: Intravenous  PONV Risk Score and Plan: 3 and Treatment may vary due to age or medical condition and Propofol infusion  Airway Management Planned: Natural Airway  Additional Equipment:   Intra-op Plan:   Post-operative Plan:   Informed Consent: I have reviewed the patients History and Physical, chart, labs and discussed the procedure including the risks, benefits and alternatives for the proposed anesthesia with the patient or authorized representative who has indicated his/her understanding and acceptance.       Plan Discussed with: CRNA  Anesthesia Plan Comments:         Anesthesia Quick Evaluation

## 2018-11-27 NOTE — Op Note (Signed)
[facility] Gastroenterology Patient Name: Olivia Morgan Procedure Date: 11/27/2018 9:29 AM MRN: [id number] Account #: [id number] Date of Birth: [birth year] Admit Type: Outpatient Age: 63 Room: [facility] OR ROOM 01 Gender: Female Note Status: Finalized Procedure:            Colonoscopy Indications:          Rectal bleeding Providers:            Lin Landsman MD, MD Referring MD:         Fredderick Severance (Referring MD) Medicines:            Monitored Anesthesia Care Complications:        No immediate complications. Estimated blood loss: None. Procedure:            Pre-Anesthesia Assessment:                       - Prior to the procedure, a History and Physical was                        performed, and patient medications and allergies were                        reviewed. The patient is competent. The risks and                        benefits of the procedure and the sedation options and                        risks were discussed with the patient. All questions                        were answered and informed consent was obtained.                        Patient identification and proposed procedure were                        verified by the physician, the nurse, the                        anesthesiologist, the anesthetist and the technician in                        the pre-procedure area in the procedure room in the                        endoscopy suite. Mental Status Examination: alert and                        oriented. Airway Examination: normal oropharyngeal                        airway and neck mobility. Respiratory Examination:                        clear to auscultation. CV Examination: normal.                        Prophylactic Antibiotics: The patient does not require  prophylactic antibiotics. Prior Anticoagulants: The                        patient has taken no previous anticoagulant or                         antiplatelet agents. ASA Grade Assessment: II - A                        patient with mild systemic disease. After reviewing the                        risks and benefits, the patient was deemed in                        satisfactory condition to undergo the procedure. The                        anesthesia plan was to use monitored anesthesia care                        (MAC). Immediately prior to administration of                        medications, the patient was re-assessed for adequacy                        to receive sedatives. The heart rate, respiratory rate,                        oxygen saturations, blood pressure, adequacy of                        pulmonary ventilation, and response to care were                        monitored throughout the procedure. The physical status                        of the patient was re-assessed after the procedure.                       After obtaining informed consent, the colonoscope was                        passed under direct vision. Throughout the procedure,                        the patient's blood pressure, pulse, and oxygen                        saturations were monitored continuously. The                        Colonoscope was introduced through the anus and                        advanced to the the cecum, identified by appendiceal  orifice and ileocecal valve. The colonoscopy was                        performed without difficulty. The patient tolerated the                        procedure well. The quality of the bowel preparation                        was evaluated using the BBPS [facility] Bowel Preparation                        Scale) with scores of: Right Colon = 3, Transverse                        Colon = 3 and Left Colon = 3 (entire mucosa seen well                        with no residual staining, small fragments of stool or                        opaque liquid). The total BBPS score equals  9. Findings:      The perianal and digital rectal examinations were normal. Pertinent       negatives include normal sphincter tone and no palpable rectal lesions.      A single large localized angioectasia without bleeding was found in the       transverse colon.      Non-bleeding external hemorrhoids were found during retroflexion. The       hemorrhoids were small.      The terminal ileum appeared normal. Impression:           - A single non-bleeding colonic angioectasia.                       - Non-bleeding external hemorrhoids.                       - No specimens collected. Recommendation:       - Discharge patient to home (with escort).                       - Resume previous diet today.                       - Continue present medications.                       - Repeat colonoscopy in 10 years for surveillance. Procedure Code(s):    --- Professional ---                       [phone], Colonoscopy, flexible; diagnostic, including                        collection of specimen(s) by brushing or washing, when                        performed (separate procedure) Diagnosis Code(s):    --- Professional ---  K55.20, Angiodysplasia of colon without hemorrhage                       K64.4, Residual hemorrhoidal skin tags                       K62.5, Hemorrhage of anus and rectum CPT copyright 2019 American [employer] Association. All rights reserved. The codes documented in this report are preliminary and upon coder review may  be revised to meet current compliance requirements. Dr. Ulyess Mort Lin Landsman MD, MD 11/27/2018 9:59:59 AM This report has been signed electronically. Number of Addenda: 0 Note Initiated On: 11/27/2018 9:29 AM Scope Withdrawal Time: 0 hours 9 minutes 45 seconds  Total Procedure Duration: 0 hours 12 minutes 52 seconds  Estimated Blood Loss: Estimated blood loss: none.      [facility]

## 2018-11-27 NOTE — Anesthesia Procedure Notes (Signed)
Date/Time: 11/27/2018 9:39 AM Performed by: Cameron Ali, CRNA Pre-anesthesia Checklist: Patient identified, Emergency Drugs available, Suction available, Timeout performed and Patient being monitored Patient Re-evaluated:Patient Re-evaluated prior to induction Oxygen Delivery Method: Nasal cannula Placement Confirmation: positive ETCO2

## 2018-11-28 ENCOUNTER — Encounter: Payer: Self-pay | Admitting: Gastroenterology

## 2018-12-09 ENCOUNTER — Ambulatory Visit
Admission: RE | Admit: 2018-12-09 | Discharge: 2018-12-09 | Disposition: A | Discharge status: Home / Self Care | Payer: Managed Care, Other (non HMO) | Source: Ambulatory Visit | Attending: Nurse Practitioner | Admitting: Nurse Practitioner

## 2018-12-09 DIAGNOSIS — Z1231 Encounter for screening mammogram for malignant neoplasm of breast: Secondary | ICD-10-CM

## 2018-12-17 ENCOUNTER — Other Ambulatory Visit: Payer: Self-pay

## 2018-12-17 ENCOUNTER — Encounter: Payer: Self-pay | Admitting: Family Medicine

## 2018-12-17 ENCOUNTER — Ambulatory Visit (INDEPENDENT_AMBULATORY_CARE_PROVIDER_SITE_OTHER): Payer: Managed Care, Other (non HMO) | Admitting: Family Medicine

## 2018-12-17 VITALS — BP 124/84 | HR 66 | Temp 97.6°F | Resp 18 | Ht 64.0 in | Wt 190.3 lb

## 2018-12-17 DIAGNOSIS — Z0000 Encounter for general adult medical examination without abnormal findings: Secondary | ICD-10-CM | POA: Diagnosis not present

## 2018-12-17 NOTE — Progress Notes (Signed)
Name: Olivia Morgan   MRN: [id number]    DOB: Apr 11, 1955   Date:12/17/2018       Progress Note  Subjective  Chief Complaint  Chief Complaint  Patient presents with  . Annual Exam    HPI  Patient presents for annual CPE.  Diet: Eats a mostly balanced diet, no particular restrictions. Exercise: She is not exercising regularly.   USPSTF grade A and B recommendations    Office Visit from 12/17/2018 in [facility]  AUDIT-C Score  1    She does drink alcohol very occasionally.   Depression: Phq 9 is  Negative; always has some sleep issues for many years.  She has tried several medications in the past; takes tylenol PM that helps her the best.   Depression screen [facility] 2/9 12/17/2018 11/13/2018 09/23/2018 01/22/2018 11/22/2017  Decreased Interest 0 0 0 0 0  Down, Depressed, Hopeless 0 0 0 0 0  PHQ - 2 Score 0 0 0 0 0  Altered sleeping 3 0 0 3 -  Tired, decreased energy 0 0 0 1 -  Change in appetite 0 0 0 1 -  Feeling bad or failure about yourself  0 0 0 0 -  Trouble concentrating 0 0 0 0 -  Moving slowly or fidgety/restless 0 0 0 0 -  Suicidal thoughts 0 0 0 0 -  PHQ-9 Score 3 0 0 5 -  Difficult doing work/chores Not difficult at all Not difficult at all Not difficult at all Not difficult at all -   Hypertension: BP Readings from Last 3 Encounters:  12/17/18 124/84  11/27/18 131/83  11/21/18 127/82   Obesity: Wt Readings from Last 3 Encounters:  12/17/18 190 lb 4.8 oz (86.3 kg)  11/27/18 182 lb (82.6 kg)  11/21/18 185 lb (83.9 kg)   BMI Readings from Last 3 Encounters:  12/17/18 32.66 kg/m  11/27/18 31.24 kg/m  11/21/18 31.76 kg/m     Hep C Screening: Declines today STD testing and prevention (HIV/chl/gon/syphilis): Declines today Intimate partner violence: No concerns Sexual History/Pain during Intercourse:  No concerns  Menstrual History/LMP/Abnormal Bleeding:  No bleeding since menopause. Incontinence Symptoms: No concerns  Breast cancer:  -  Last Mammogram: Negative in August 2020 - BRCA gene screening: No family history  Osteoporosis Screening: DEXA scan in 2019 and was normal; taking vitamin D supplement OTC daily.  Weight bearing exercises discussed.   Cervical cancer screening: UTD - due next year.  She did have 2 abnormal pap smears on 2 different occasions, but has always returned back to normal. Will do 1 final pap smear next year.   Skin cancer: No concerning lesions. She is seeing dermatology routinely.   Colorectal cancer: Had colonoscopy this year and was told to return in 26 years.  No recent rectal bleeding, though she is seeing Dr. Marius Ditch for follow up. Does have some intermittent LEFT upper quadrant pain that occurs when sitting for a long period of time, goes away when she gets up and walks around for a few minutes.  Lung cancer:  Never smoker. Low Dose CT Chest recommended if Age 102-80 years, 30 pack-year currently smoking OR have quit w/in 15years. Patient does not qualify.   ECG: Denies chest pain, shortness of breath, or palpitations.   Advanced Care Planning: A voluntary discussion about advance care planning including the explanation and discussion of advance directives.  Discussed health care proxy and Living will, and the patient was able to identify a health care  proxy as Boykin Peek.  Patient does have a living will at present time. If patient does have living will, I have requested they bring this to the clinic to be scanned in to their chart.  Lipids: She would like to hold off on checking this today. Lab Results  Component Value Date   CHOL 153 11/22/2017   CHOL 163 11/08/2016   CHOL 162 11/11/2015   Lab Results  Component Value Date   HDL 71 11/22/2017   HDL 85 11/08/2016   HDL 69 11/11/2015   Lab Results  Component Value Date   LDLCALC 68 11/22/2017   LDLCALC 62 11/08/2016   LDLCALC 66 11/11/2015   Lab Results  Component Value Date   TRIG 70 11/22/2017   TRIG 79 11/08/2016   TRIG 135  11/11/2015   Lab Results  Component Value Date   CHOLHDL 2.2 11/22/2017   CHOLHDL 1.9 11/08/2016   CHOLHDL 2.3 11/11/2015   No results found for: LDLDIRECT  Glucose: Glucose  Date Value Ref Range Status  11/13/2018 111 (H) 65 - 99 mg/dL Final  11/22/2017 86 65 - 99 mg/dL Final  11/08/2016 90 65 - 99 mg/dL Final    Patient Active Problem List   Diagnosis Date Noted  . Rectal bleeding   . 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  . History of total hip replacement 03/05/2017  . Allergic reaction 02/12/2017  . Dermatitis 02/12/2017  . Allergic rhinitis 04/16/2016  . Presence of right artificial knee joint 12/16/2015  . Obesity (BMI 30.0-34.9) 11/18/2015  . Vitamin D deficiency 11/11/2015  . Preventative health care 11/11/2015  . Mass of left lower leg 09/07/2015  . Adverse drug reaction 06/13/2015  . Greater trochanteric bursitis of left hip 06/05/2015  . Hammer toe of right foot 06/05/2015  . Neuropathy of both feet 06/05/2015  . 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 Thrombocytopenia ([place]) 04/05/2015  . Esophageal reflux 03/19/2015  . Need for shingles vaccine 03/14/2015  . Lower back pain 03/10/2015  . Dysphagia 03/10/2015  . Herpes simplex virus infection   . OA (osteoarthritis) of hip   . GERD (gastroesophageal reflux disease)   . Plantar fasciitis   . Depression   . Genital herpes   . Bilateral hearing loss   . Abnormal ECG 09/09/2014  . Shortness of breath 09/09/2014  . Hip pain 09/09/2012  . Other mechanical complication of other internal orthopedic device, implant, and graft 09/09/2012  . Medial collateral ligament sprain of knee 11/13/2011  . Other and unspecified derangement of medial meniscus 06/13/2011  . Primary localized osteoarthrosis, lower leg 06/13/2011    Past Surgical History:  Procedure Laterality Date  . CESAREAN SECTION    . COLONOSCOPY  2010  . COLONOSCOPY WITH PROPOFOL N/A 11/27/2018    Procedure: COLONOSCOPY WITH PROPOFOL;  Surgeon: Lin Landsman, MD;  Location: [place];  Service: Endoscopy;  Laterality: N/A;  . ENDOSCOPIC CONCHA BULLOSA RESECTION Left 05/23/2018   Procedure: ENDOSCOPIC CONCHA BULLOSA RESECTION;  Surgeon: Margaretha Sheffield, MD;  Location: [place];  Service: ENT;  Laterality: Left;  . FOOT SURGERY Right   . REPLACEMENT TOTAL KNEE  3/13   [facility]Hallows at [facility]  . SEPTOPLASTY Bilateral 05/23/2018   Procedure: SEPTOPLASTY;  Surgeon: Margaretha Sheffield, MD;  Location: [place];  Service: ENT;  Laterality: Bilateral;  . TOTAL HIP ARTHROPLASTY Left 10/2008  . TURBINATE REDUCTION Bilateral 05/23/2018   Procedure: INFERIOR TURBINATE REDUCTION;  Surgeon:  Margaretha Sheffield, MD;  Location: [place];  Service: ENT;  Laterality: Bilateral;  . UPPER GI ENDOSCOPY  1992    Family History  Problem Relation Age of Onset  . Aneurysm Mother   . Stroke Mother   . Heart disease Mother   . Heart disease Father 18  . Hypertension Father   . Lung disease Father   . Heart disease Paternal Grandfather   . Panic disorder Brother   . Milk intolerance Daughter   . Polycystic ovary syndrome Daughter   . Obesity Brother   . Hyperlipidemia Brother   . Hypertension Brother   . Diabetes Brother   . Cancer Neg Hx   . COPD Neg Hx   . Breast cancer Neg Hx     Social History   Socioeconomic History  . Marital status: Divorced    Spouse name: Not on file  . Number of children: 1  . Years of education: Not on file  . Highest education level: Not on file  Occupational History  . Not on file  Social Needs  . Financial resource strain: Not hard at all  . Food insecurity    Worry: Never true    Inability: Never true  . Transportation needs    Medical: No    Non-medical: No  Tobacco Use  . Smoking status: Never Smoker  . Smokeless tobacco: Never Used  Substance and Sexual Activity  . Alcohol use: Yes    Comment: socially  .  Drug use: No  . Sexual activity: Yes    Birth control/protection: Post-menopausal  Lifestyle  . Physical activity    Days per week: 2 days    Minutes per session: 30 min  . Stress: Not at all  Relationships  . Social Herbalist on phone: More than three times a week    Gets together: Never    Attends religious service: Never    Active member of club or organization: No    Attends meetings of clubs or organizations: Never    Relationship status: Divorced  . Intimate partner violence    Fear of current or ex partner: No    Emotionally abused: No    Physically abused: No    Forced sexual activity: No  Other Topics Concern  . Not on file  Social History Narrative  . Not on file     Current Outpatient Medications:  .  acetaminophen (TYLENOL) 500 MG tablet, Take 500 mg by mouth every 6 (six) hours as needed., Disp: , Rfl:  .  buPROPion (WELLBUTRIN XL) 150 MG 24 hr tablet, TAKE 3 TABLETS BY MOUTH  DAILY, Disp: 270 tablet, Rfl: 3 .  Cholecalciferol (VITAMIN D-3 PO), Take 1,000 Units by mouth once a week. , Disp: , Rfl:  .  diphenhydrAMINE (BENADRYL) 25 MG tablet, Take 25 mg by mouth every 6 (six) hours as needed., Disp: , Rfl:  .  Multiple Vitamin (MULTIVITAMIN) tablet, Take 1 tablet by mouth daily., Disp: , Rfl:  .  RABEprazole (ACIPHEX) 20 MG tablet, TAKE 1 TABLET BY MOUTH  DAILY. CAUTION: PROLONGED  USE MAY INCREASE RISK OF  PNEUMONIA, COLITIS,  OSTEOPOROSIS, ANEMIA, Disp: 90 tablet, Rfl: 0 .  triamcinolone (NASACORT ALLERGY 24HR) 55 MCG/ACT AERO nasal inhaler, Place 2 sprays into the nose daily., Disp: , Rfl:  .  valACYclovir (VALTREX) 500 MG tablet, TAKE 1 TABLET BY MOUTH  DAILY, Disp: 90 tablet, Rfl: 3 .  VITAMIN E PO, Take by mouth once  a week. , Disp: , Rfl:  .  cyclobenzaprine (FLEXERIL) 10 MG tablet, cyclobenzaprine 10 mg tablet  Take 1 tablet(s) 3 times a day by oral route., Disp: , Rfl:  .  traMADol (ULTRAM) 50 MG tablet, tramadol 50 mg tablet  Take 1 tablet every  6 hours by oral route as needed., Disp: , Rfl:   Allergies  Allergen Reactions  . Levofloxacin Hives  . Morphine Nausea Only  . Celecoxib Other (See Comments)    SOB  . Celexa [Citalopram Hydrobromide]   . Cymbalta [Duloxetine Hcl] Hives  . Gabapentin Other (See Comments)    anger  . Levaquin [Levofloxacin In D5w] Hives  . Valium [Diazepam] Other (See Comments)    hallucinations  . Monistat [Miconazole] Rash  . Prednisone Anxiety     ROS  Constitutional: Negative for fever or weight change.  Respiratory: Negative for cough and shortness of breath.   Cardiovascular: Negative for chest pain or palpitations.  Gastrointestinal: See HPI Musculoskeletal: Negative for gait problem or joint swelling.  Skin: Negative for rash.  Neurological: Negative for dizziness or headache.  No other specific complaints in a complete review of systems (except as listed in HPI above).  Objective  Vitals:   12/17/18 1037  BP: 124/84  Pulse: 66  Resp: 18  Temp: 97.6 F (36.4 C)  TempSrc: Oral  SpO2: 99%  Weight: 190 lb 4.8 oz (86.3 kg)  Height: _0  (1.626 m)    Body mass index is 32.66 kg/m.  Physical Exam  Constitutional: Patient appears well-developed and well-nourished. No distress.  HENT: Head: Normocephalic and atraumatic. Ears: B TMs ok, no erythema or effusion; Nose: Nose normal. Mouth/Throat: Oropharynx is clear and moist. No oropharyngeal exudate.  Eyes: Conjunctivae and EOM are normal. Pupils are equal, round, and reactive to light. No scleral icterus.  Neck: Normal range of motion. Neck supple. No JVD present. No thyromegaly present.  Cardiovascular: Normal rate, regular rhythm and normal heart sounds.  No murmur heard. No BLE edema. Pulmonary/Chest: Effort normal and breath sounds normal. No respiratory distress. Abdominal: Soft. Bowel sounds are normal, no distension. There is no tenderness. no masses Breast: no lumps or masses, no nipple discharge or rashes FEMALE  GENITALIA: Deferred Musculoskeletal: Normal range of motion, no joint effusions. No gross deformities Neurological: he is alert and oriented to person, place, and time. No cranial nerve deficit. Coordination, balance, strength, speech and gait are normal.  Skin: Skin is warm and dry. No rash noted. No erythema.  Psychiatric: Patient has a normal mood and affect. behavior is normal. Judgment and thought content normal.   Recent Results (from the past 2160 hour(s))  CBC w/Diff/Platelet     Status: None   Collection Time: 11/13/18 12:00 AM  Result Value Ref Range   WBC 6.4 3.4 - 10.8 x10E3/uL   RBC 4.97 3.77 - 5.28 x10E6/uL   Hemoglobin 15.3 11.1 - 15.9 g/dL   Hematocrit 45.8 34.0 - 46.6 %   MCV 92 79 - 97 fL   MCH 30.8 26.6 - 33.0 pg   MCHC 33.4 31.5 - 35.7 g/dL   RDW 13.2 11.7 - 15.4 %   Platelets 152 150 - 450 x10E3/uL   Neutrophils 82 Not Estab. %   Lymphs 11 Not Estab. %   Monocytes 5 Not Estab. %   Eos 2 Not Estab. %   Basos 0 Not Estab. %   Neutrophils Absolute 5.3 1.4 - 7.0 x10E3/uL   Lymphocytes Absolute 0.7 0.7 - 3.1  x10E3/uL   Monocytes Absolute 0.3 0.1 - 0.9 x10E3/uL   EOS (ABSOLUTE) 0.1 0.0 - 0.4 x10E3/uL   Basophils Absolute 0.0 0.0 - 0.2 x10E3/uL   Immature Granulocytes 0 Not Estab. %   Immature Grans (Abs) 0.0 0.0 - 0.1 x10E3/uL  Comprehensive metabolic panel     Status: Abnormal   Collection Time: 11/13/18 12:00 AM  Result Value Ref Range   Glucose 111 (H) 65 - 99 mg/dL   BUN 11 8 - 27 mg/dL   Creatinine, Ser 0.96 0.57 - 1.00 mg/dL   GFR calc non Af Amer 63 >59 mL/min/1.73   GFR calc Af Amer 72 >59 mL/min/1.73   BUN/Creatinine Ratio 11 (L) 12 - 28   Sodium 143 134 - 144 mmol/L   Potassium 4.1 3.5 - 5.2 mmol/L   Chloride 102 96 - 106 mmol/L   CO2 26 20 - 29 mmol/L   Calcium 9.8 8.7 - 10.3 mg/dL   Total Protein 6.8 6.0 - 8.5 g/dL   Albumin 4.5 3.8 - 4.8 g/dL   Globulin, Total 2.3 1.5 - 4.5 g/dL   Albumin/Globulin Ratio 2.0 1.2 - 2.2   Bilirubin Total 0.4  0.0 - 1.2 mg/dL   Alkaline Phosphatase 83 39 - 117 IU/L   AST 13 0 - 40 IU/L   ALT 10 0 - 32 IU/L  Specimen status report     Status: None   Collection Time: 11/13/18 12:00 AM  Result Value Ref Range   specimen status report Comment     Comment: Please note Please note The date and/or time of collection was not indicated on the requisition as required by state and federal law.  The date of receipt of the specimen was used as the collection date if not supplied.   SARS CORONAVIRUS 2 Nasal Swab Aptima Multi Swab     Status: None   Collection Time: 11/25/18  9:27 AM   Specimen: Aptima Multi Swab; Nasal Swab  Result Value Ref Range   SARS Coronavirus 2 NEGATIVE NEGATIVE    Comment: (NOTE) SARS-CoV-2 target nucleic acids are NOT DETECTED. The SARS-CoV-2 RNA is generally detectable in upper and lower respiratory specimens during the acute phase of infection. Negative results do not preclude SARS-CoV-2 infection, do not rule out co-infections with other pathogens, and should not be used as the sole basis for treatment or other patient management decisions. Negative results must be combined with clinical observations, patient history, and epidemiological information. The expected result is Negative. Fact Sheet for Patients: SugarRoll.be Fact Sheet for Healthcare Providers: https://www.woods-mathews.com/ This test is not yet approved or cleared by the [place] FDA and  has been authorized for detection and/or diagnosis of SARS-CoV-2 by FDA under an Emergency Use Authorization (EUA). This EUA will remain  in effect (meaning this test can be used) for the duration of the COVID-19 declaration under Section 56 4(b)(1) of the Act, 21 U.S.C. section 360bbb-3(b)(1), unless the authorization is terminated or revoked sooner. Performed at [facility], [place] [address], [place], [place] [address]     Fall Risk: Fall Risk  12/17/2018 11/13/2018  09/23/2018 01/22/2018 11/22/2017  Falls in the past year? 0 0 0 Yes No  Number falls in past yr: 0 0 0 1 -  Injury with Fall? 0 0 - No -  Follow up Falls evaluation completed - - - -    Assessment & Plan  1. Annual physical exam -USPSTF grade A and B recommendations reviewed with patient; age-appropriate recommendations,  preventive care, screening tests, etc discussed and encouraged; healthy living encouraged; see AVS for patient education given to patient -Discussed importance of 150 minutes of physical activity weekly, eat two servings of fish weekly, eat one serving of tree nuts ( cashews, pistachios, pecans, almonds.Marland Kitchen) every other day, eat 6 servings of fruit/vegetables daily and drink plenty of water and avoid sweet beverages.

## 2018-12-17 NOTE — Patient Instructions (Addendum)
Belsomra - for sleep, let me know if you would like to try this medication.

## 2019-01-03 ENCOUNTER — Other Ambulatory Visit: Payer: Self-pay

## 2019-01-03 DIAGNOSIS — Z20822 Contact with and (suspected) exposure to covid-19: Secondary | ICD-10-CM

## 2019-01-04 LAB — NOVEL CORONAVIRUS, NAA: SARS-CoV-2, NAA: NOT DETECTED

## 2019-01-15 ENCOUNTER — Encounter: Payer: Self-pay | Admitting: Podiatry

## 2019-01-15 ENCOUNTER — Ambulatory Visit: Payer: Managed Care, Other (non HMO) | Admitting: Podiatry

## 2019-01-15 ENCOUNTER — Other Ambulatory Visit: Payer: Self-pay

## 2019-01-15 DIAGNOSIS — M2011 Hallux valgus (acquired), right foot: Secondary | ICD-10-CM | POA: Diagnosis not present

## 2019-01-15 NOTE — Progress Notes (Signed)
She presents today for surgical consult of her right foot.  She states that she would like to have this done sometime in December states that is been previously corrected but would like for [place] to reevaluate it and treated.  Objective: Vital signs are stable alert and oriented x3.  Pulses are palpable.  Neurologic sensorium is intact.  Deep tendon reflexes are intact.  Muscle strength is normal symmetrical.  She has a hallux abductovalgus deformity when assessed radiographically demonstrates a Lapidus procedure that was placed in a poor correction position.  Hallux interphalangeal and hallux abductus angle is increased considerably radiographically.  And she has hammertoe deformities third and fourth of the right foot with a fusion of the second toe.  She has screws to the second and third metatarsal heads.  Assessment: Pain in limb secondary to hallux abductovalgus deformity of the right foot.  Hammertoe deformity third and fourth right foot.  Plan: Discussed etiology pathology conservative versus surgical therapies at this point went ahead and consented her for a long arm Austin bunion repair with screws right foot hammertoe repair with pins or screws to toes 3 and 4 of the right foot.  We discussed the need for a possible cast as well as a cam walker.  She understands this is amenable to it understands that she may be better off with a knee scooter or crutches for a few few days.  We discussed the possible complications which may include but not limited to postop pain bleeding swelling infection recurrence need for further surgery overcorrection under correction.

## 2019-01-15 NOTE — Patient Instructions (Signed)
Pre-Operative Instructions  Congratulations, you have decided to take an important step towards improving your quality of life.  You can be assured that the doctors and staff at [facility] will be with you every step of the way.  Here are some important things you should know:  1. Plan to be at the surgery center/hospital at least 1 (one) hour prior to your scheduled time, unless otherwise directed by the surgical center/hospital staff.  You must have a responsible adult accompany you, remain during the surgery and drive you home.  Make sure you have directions to the surgical center/hospital to ensure you arrive on time. 2. If you are having surgery at [facility] or [facility], you will need a copy of your medical history and physical form from your family physician within one month prior to the date of surgery. We will give you a form for your primary physician to complete.  3. We make every effort to accommodate the date you request for surgery.  However, there are times where surgery dates or times have to be moved.  We will contact you as soon as possible if a change in schedule is required.   4. No aspirin/ibuprofen for one week before surgery.  If you are on aspirin, any non-steroidal anti-inflammatory medications (Mobic, Aleve, Ibuprofen) should not be taken seven (7) days prior to your surgery.  You make take Tylenol for pain prior to surgery.  5. Medications - If you are taking daily heart and blood pressure medications, seizure, reflux, allergy, asthma, anxiety, pain or diabetes medications, make sure you notify the surgery center/hospital before the day of surgery so they can tell you which medications you should take or avoid the day of surgery. 6. No food or drink after midnight the night before surgery unless directed otherwise by surgical center/hospital staff. 7. No alcoholic beverages 24-hours prior to surgery.  No smoking 24-hours prior or 24-hours after  surgery. 8. Wear loose pants or shorts. They should be loose enough to fit over bandages, boots, and casts. 9. Don't wear slip-on shoes. Sneakers are preferred. 10. Bring your boot with you to the surgery center/hospital.  Also bring crutches or a walker if your physician has prescribed it for you.  If you do not have this equipment, it will be provided for you after surgery. 11. If you have not been contacted by the surgery center/hospital by the day before your surgery, call to confirm the date and time of your surgery. 12. Leave-time from work may vary depending on the type of surgery you have.  Appropriate arrangements should be made prior to surgery with your employer. 13. Prescriptions will be provided immediately following surgery by your doctor.  Fill these as soon as possible after surgery and take the medication as directed. Pain medications will not be refilled on weekends and must be approved by the doctor. 14. Remove nail polish on the operative foot and avoid getting pedicures prior to surgery. 15. Wash the night before surgery.  The night before surgery wash the foot and leg well with water and the antibacterial soap provided. Be sure to pay special attention to beneath the toenails and in between the toes.  Wash for at least three (3) minutes. Rinse thoroughly with water and dry well with a towel.  Perform this wash unless told not to do so by your physician.  Enclosed: 1 Ice pack (please put in freezer the night before surgery)   1 Hibiclens skin cleaner     Pre-op instructions  If you have any questions regarding the instructions, please do not hesitate to call our office.  [place]: [address], [place], [place] [address] -- [phone]  [place]: [address] [place], [place] [address] -- [phone]  [place]: [address]  [place], [place] [address] -- [phone]   Website: [email or web address] 

## 2019-01-17 ENCOUNTER — Telehealth: Payer: Self-pay | Admitting: *Deleted

## 2019-01-17 NOTE — Telephone Encounter (Signed)
"  I'm calling to schedule my surgery with Dr. Milinda Pointer."  Do you have a date that you like?  "I'd like to do it the last week of November or the first week of December."  Dr. Milinda Pointer can do it on November 20 or March 14, 2019.  "Schedule me for December 4."  I'll get it scheduled.  Someone from the surgical center will give you a call a day or two prior to your surgery date and will give you your arrival time.  You need to register via the surgical center's online portal, the instructions on how to do that is in the brochure that we gave you.

## 2019-01-26 ENCOUNTER — Other Ambulatory Visit: Payer: Self-pay | Admitting: Family Medicine

## 2019-02-18 ENCOUNTER — Other Ambulatory Visit: Payer: Self-pay | Admitting: Family Medicine

## 2019-02-18 MED ORDER — RABEPRAZOLE SODIUM 20 MG PO TBEC: DELAYED_RELEASE_TABLET | ORAL | 3 refills | Status: DC

## 2019-02-18 NOTE — Telephone Encounter (Signed)
Pt states her Rx RABEprazole (ACIPHEX) 20 MG tablet has not shipped from mail order pharmacy yet an she would like a 7 day supply sent to   [id number] - [place], [place] [phone] (Phone) [phone] (Fax)   She is out and has been trying other OTC meds and not working!  She hopes to get today if possible.

## 2019-02-18 NOTE — Telephone Encounter (Signed)
Order pend for Raquel Sarna to sign off

## 2019-02-28 ENCOUNTER — Telehealth: Payer: Self-pay | Admitting: Podiatry

## 2019-02-28 NOTE — Telephone Encounter (Signed)
DOS: 03/14/2019  SURGICAL PROCEDURES: [employer] [place] TGGYI([id number]) and Hammertoe Repair w/ Screw 3rd and 4th [phone])  C[employer] Effective Date: 04/10/2017 -   Deductible is $500 with $500 met and $0 remaining.  Out of Pocket is $2,500 with $2,426.11 met and $73.89 remaining.  CoInsurance is 90% / 10%  No prior authorization or referral are required per Jums M.  Call reference # [id number].

## 2019-03-03 ENCOUNTER — Telehealth: Payer: Self-pay | Admitting: Podiatry

## 2019-03-03 NOTE — Telephone Encounter (Signed)
Pt left voicemail today stating that she is having surgery on 12.4.2020 and thought she had a boot at home and she cannot locate it . She is asking if she could come in to pick one up. She wears a size 8  Shoes. Please call pt and let her know what she needs to do.

## 2019-03-12 ENCOUNTER — Other Ambulatory Visit: Payer: Self-pay | Admitting: Podiatry

## 2019-03-12 MED ORDER — OXYCODONE-ACETAMINOPHEN 10-325 MG PO TABS: 1.0000 | ORAL_TABLET | Freq: Three times a day (TID) | ORAL | 0 refills | Status: AC | PRN

## 2019-03-12 MED ORDER — ONDANSETRON HCL 4 MG PO TABS: 4.0000 mg | ORAL_TABLET | Freq: Three times a day (TID) | ORAL | 0 refills | Status: DC | PRN

## 2019-03-12 MED ORDER — CEPHALEXIN 500 MG PO CAPS: 500.0000 mg | ORAL_CAPSULE | Freq: Three times a day (TID) | ORAL | 0 refills | Status: DC

## 2019-03-14 ENCOUNTER — Encounter: Payer: Self-pay | Admitting: Podiatry

## 2019-03-14 DIAGNOSIS — M2011 Hallux valgus (acquired), right foot: Secondary | ICD-10-CM | POA: Diagnosis not present

## 2019-03-14 DIAGNOSIS — M2041 Other hammer toe(s) (acquired), right foot: Secondary | ICD-10-CM | POA: Diagnosis not present

## 2019-03-19 ENCOUNTER — Other Ambulatory Visit: Payer: Self-pay

## 2019-03-19 ENCOUNTER — Ambulatory Visit (INDEPENDENT_AMBULATORY_CARE_PROVIDER_SITE_OTHER): Payer: Self-pay | Admitting: Podiatry

## 2019-03-19 ENCOUNTER — Encounter: Payer: Self-pay | Admitting: Podiatry

## 2019-03-19 ENCOUNTER — Ambulatory Visit (INDEPENDENT_AMBULATORY_CARE_PROVIDER_SITE_OTHER): Payer: Managed Care, Other (non HMO)

## 2019-03-19 DIAGNOSIS — Z9889 Other specified postprocedural states: Secondary | ICD-10-CM

## 2019-03-19 DIAGNOSIS — M2011 Hallux valgus (acquired), right foot: Secondary | ICD-10-CM

## 2019-03-19 MED ORDER — HYDROCODONE-ACETAMINOPHEN 10-325 MG PO TABS: 1.0000 | ORAL_TABLET | Freq: Three times a day (TID) | ORAL | 0 refills | Status: AC | PRN

## 2019-03-19 NOTE — Progress Notes (Signed)
She presents today for her first postop visit she is status post [place] bunionectomy and hammertoe repair #3 #4.  She states that it felt okay there is some soreness date of surgery 03/14/2019.  Objective: Vital signs are stable she is alert and oriented x3.  There is minimal edema no erythema cellulitis drainage or odor incision sites appear to be in good order.  K wires are in place.  Radiographs demonstrate a well place capital fragment with double screw fixation and hammertoe repair toes #3 and 4 of the right foot.  Assessment: Well-healing surgical foot.  Plan: Redressed today dressed a compressive dressing follow-up with her in about a week or so and may be consider removing sutures at that point.

## 2019-03-25 ENCOUNTER — Telehealth: Payer: Self-pay | Admitting: Family Medicine

## 2019-03-25 DIAGNOSIS — T3695XA Adverse effect of unspecified systemic antibiotic, initial encounter: Secondary | ICD-10-CM

## 2019-03-25 DIAGNOSIS — B379 Candidiasis, unspecified: Secondary | ICD-10-CM

## 2019-03-25 MED ORDER — FLUCONAZOLE 150 MG PO TABS: 150.0000 mg | ORAL_TABLET | Freq: Once | ORAL | 0 refills | Status: AC

## 2019-03-25 NOTE — Telephone Encounter (Signed)
Patient stated she has taken before with no issues. Please call in

## 2019-03-25 NOTE — Telephone Encounter (Signed)
I can send in fluconazole - has taken back in 2016.  Please confirm with her that she has taken without allergic reaction in the past as it is similar to monistat (miconazole) which caused a rash.  Once confirmed that she has taken previously with no issue, I will send in fluconazole.

## 2019-03-25 NOTE — Telephone Encounter (Signed)
Pt has surgery on her foot and was given anti biotics for it and now she has developed a yeast infection and would like a Pill (fluconazole) called in to the pharmacy / please advise

## 2019-03-25 NOTE — Telephone Encounter (Signed)
Do she need a virtual appointment for this. Last seen in September

## 2019-03-25 NOTE — Addendum Note (Signed)
Addended by: Hubbard Hartshorn on: 03/25/2019 02:31 PM   Modules accepted: Orders

## 2019-03-26 ENCOUNTER — Encounter: Payer: Self-pay | Admitting: Podiatry

## 2019-03-26 ENCOUNTER — Ambulatory Visit (INDEPENDENT_AMBULATORY_CARE_PROVIDER_SITE_OTHER): Payer: Managed Care, Other (non HMO) | Admitting: Podiatry

## 2019-03-26 ENCOUNTER — Other Ambulatory Visit: Payer: Self-pay

## 2019-03-26 DIAGNOSIS — Z9889 Other specified postprocedural states: Secondary | ICD-10-CM

## 2019-03-26 NOTE — Progress Notes (Signed)
She presents today date of surgery 03/14/2019 status post Liane Com[facility] bunionectomy hammertoe #3 and 4 states that is doing pretty good just hurts to walk on still.  Objective: Vital signs are stable alert and oriented x3.  Pulses are palpable.  Moderate edema.  Incisions have not quite healed all the way but are looking good she has good range of motion.  Assessment: Well-healing surgical foot.  Plan: We will leave her stitches in 1 more week redressed the foot today continue use of Cam walker follow-up with me in a week to remove stitches.

## 2019-03-28 ENCOUNTER — Other Ambulatory Visit: Payer: Self-pay | Admitting: Family Medicine

## 2019-03-28 DIAGNOSIS — T3695XA Adverse effect of unspecified systemic antibiotic, initial encounter: Secondary | ICD-10-CM

## 2019-03-28 DIAGNOSIS — B379 Candidiasis, unspecified: Secondary | ICD-10-CM

## 2019-03-28 NOTE — Telephone Encounter (Signed)
Requested medication (s) are due for refill today: no  Requested medication (s) are on the active medication list: yes  Last refill:  03/25/2019  Future visit scheduled: yes  Notes to clinic:  Medication not assigned to a protocol, review manually.   Requested Prescriptions  Pending Prescriptions Disp Refills   fluconazole (DIFLUCAN) 150 MG tablet [Pharmacy Med Name: FLUCONAZOLE 150 MG TABLET] 1 tablet 0    Sig: TAKE 1 TABLET BY MOUTH AS ONE DOSE      Off-Protocol Failed - 03/28/2019  8:35 AM      Failed - Medication not assigned to a protocol, review manually.      Passed - Valid encounter within last 12 months    Recent Outpatient Visits           3 months ago Annual physical exam   [place], FNP   4 months ago Periumbilical abdominal pain   [place], FNP   6 months ago Pain of left calf   [place], NP   1 year ago Mildly obese   [place], NP   1 year ago Preventative health care   [facility], Satira Anis, MD       Future Appointments             In 8 months Hubbard Hartshorn, [facility], [facility]

## 2019-03-28 NOTE — Telephone Encounter (Signed)
Pt calling to check status. Pt states that her yeast infections has not went away. Please advise

## 2019-03-30 NOTE — Telephone Encounter (Signed)
She will need an appointment if not improving after diflucan - need to do eval and swab.

## 2019-04-09 ENCOUNTER — Other Ambulatory Visit: Payer: Self-pay

## 2019-04-09 ENCOUNTER — Ambulatory Visit (INDEPENDENT_AMBULATORY_CARE_PROVIDER_SITE_OTHER): Payer: Managed Care, Other (non HMO)

## 2019-04-09 ENCOUNTER — Ambulatory Visit (INDEPENDENT_AMBULATORY_CARE_PROVIDER_SITE_OTHER): Payer: Managed Care, Other (non HMO) | Admitting: Podiatry

## 2019-04-09 DIAGNOSIS — Z9889 Other specified postprocedural states: Secondary | ICD-10-CM

## 2019-04-09 DIAGNOSIS — M2011 Hallux valgus (acquired), right foot: Secondary | ICD-10-CM

## 2019-04-09 NOTE — Progress Notes (Signed)
She presents today for postop visit date of surgery was March 14, 2019 status post [facility] bunionectomy hammertoe repair third and fourth toes of the right foot.  The she denies fever chills nausea vomiting muscle aches pains.  Objective: Pulses are palpable dry sterile dressing removed sutures are intact were removed no open lesions no open wounds.  K wires are intact the toes 3 and 4 the right foot hallux is rectus.  Assessment: Well-healing surgical foot.  Plan: Placed her in a compression anklet and a Darco shoe I will follow-up with her in about 2 weeks which time we hope to be able to remove the pins however x-rays will be necessary.

## 2019-04-23 ENCOUNTER — Encounter: Payer: Managed Care, Other (non HMO) | Admitting: Podiatry

## 2019-04-28 ENCOUNTER — Ambulatory Visit (INDEPENDENT_AMBULATORY_CARE_PROVIDER_SITE_OTHER): Payer: Managed Care, Other (non HMO)

## 2019-04-28 ENCOUNTER — Other Ambulatory Visit: Payer: Self-pay

## 2019-04-28 ENCOUNTER — Encounter: Payer: Self-pay | Admitting: Podiatry

## 2019-04-28 ENCOUNTER — Ambulatory Visit (INDEPENDENT_AMBULATORY_CARE_PROVIDER_SITE_OTHER): Payer: Managed Care, Other (non HMO) | Admitting: Podiatry

## 2019-04-28 DIAGNOSIS — M2011 Hallux valgus (acquired), right foot: Secondary | ICD-10-CM

## 2019-04-28 DIAGNOSIS — Z9889 Other specified postprocedural states: Secondary | ICD-10-CM

## 2019-04-28 NOTE — Progress Notes (Signed)
She presents today status post 6 weeks 03/14/2019 [place] bunionectomy hammertoe repair 3 and 4 of the right foot.  K wires are still at the end of the toes I am ready to get rid of these she said my foot is starting to look more normal again.  Objective: Vital signs are stable she is alert oriented x3.  There is no erythema minimal edema no cellulitis drainage or odor K wires to the third fourth toes of the right foot are intact.  Radiographs taken today demonstrate well healing osteotomies.  K wires are intact screws are intact.  Assessment: Well-healing surgical foot date of surgery 03/14/2019.  Plan: I will allow her to get back to her regular shoes today and slowly return to her activity level follow-up with her in a couple weeks.  Hopefully be able to release her to get back to work at that time

## 2019-05-26 ENCOUNTER — Other Ambulatory Visit: Payer: Self-pay

## 2019-05-26 ENCOUNTER — Ambulatory Visit (INDEPENDENT_AMBULATORY_CARE_PROVIDER_SITE_OTHER): Payer: Managed Care, Other (non HMO) | Admitting: Podiatry

## 2019-05-26 ENCOUNTER — Encounter: Payer: Self-pay | Admitting: Podiatry

## 2019-05-26 ENCOUNTER — Ambulatory Visit (INDEPENDENT_AMBULATORY_CARE_PROVIDER_SITE_OTHER): Payer: Managed Care, Other (non HMO)

## 2019-05-26 DIAGNOSIS — M2041 Other hammer toe(s) (acquired), right foot: Secondary | ICD-10-CM | POA: Diagnosis not present

## 2019-05-26 DIAGNOSIS — Z9889 Other specified postprocedural states: Secondary | ICD-10-CM

## 2019-05-26 DIAGNOSIS — M2011 Hallux valgus (acquired), right foot: Secondary | ICD-10-CM | POA: Diagnosis not present

## 2019-05-26 NOTE — Progress Notes (Signed)
She presents today for a postop visit date of surgery 03/14/2019 status post Eliberto Ivory bunionectomy right hammertoe repair #3 #4 of the right foot states that she still has some soreness on the lateral aspect of the foot when she walks.  Objective: Vital signs are stable she is alert oriented x3.  Pulses are palpable.  There is no erythema edema cellulitis drainage or odor she has good range of motion of her first metatarsophalangeal joint.  Her toes are a little curved from removing the pins but otherwise she is in good position.  Assessment: Well-healing surgical foot.  Plan: Follow-up with her in a month or so if necessary.  Placed padding beneath the fifth metatarsal of the right foot with tape.  Hopefully this will increase her heel-to-toe ambulation.

## 2019-06-26 ENCOUNTER — Other Ambulatory Visit: Payer: Self-pay | Admitting: Family Medicine

## 2019-07-07 ENCOUNTER — Encounter: Payer: Managed Care, Other (non HMO) | Admitting: Podiatry

## 2019-07-08 ENCOUNTER — Other Ambulatory Visit: Payer: Managed Care, Other (non HMO)

## 2019-07-09 ENCOUNTER — Other Ambulatory Visit: Payer: Managed Care, Other (non HMO)

## 2019-07-16 ENCOUNTER — Ambulatory Visit (INDEPENDENT_AMBULATORY_CARE_PROVIDER_SITE_OTHER): Payer: Managed Care, Other (non HMO) | Admitting: Podiatry

## 2019-07-16 ENCOUNTER — Encounter: Payer: Self-pay | Admitting: Podiatry

## 2019-07-16 ENCOUNTER — Other Ambulatory Visit: Payer: Self-pay

## 2019-07-16 ENCOUNTER — Ambulatory Visit (INDEPENDENT_AMBULATORY_CARE_PROVIDER_SITE_OTHER): Payer: Managed Care, Other (non HMO)

## 2019-07-16 VITALS — Temp 98.1°F

## 2019-07-16 DIAGNOSIS — Z9889 Other specified postprocedural states: Secondary | ICD-10-CM

## 2019-07-16 DIAGNOSIS — M2011 Hallux valgus (acquired), right foot: Secondary | ICD-10-CM | POA: Diagnosis not present

## 2019-07-16 DIAGNOSIS — M2041 Other hammer toe(s) (acquired), right foot: Secondary | ICD-10-CM

## 2019-07-16 NOTE — Progress Notes (Signed)
She presents today date of surgery 03/14/2019 status post Eliberto Ivory bunionectomy long-arm with double screw fixation hammertoe repair #3 #4 states that I still have pain on the bottom of the first metatarsophalangeal joint.  Objective: Vital signs are stable alert and oriented x3.  Pulses are palpable.  She has no erythema edema cellulitis drainage odor no reactive hyperkeratotic tissue.  She has mild tenderness on palpation of the tibial sesamoid and the lateral view of the calcaneus everything appears to be fused however she does demonstrate some mild hallux malleus that could be resulting in the pressure of the sesamoid.  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  Plan: At this point I highly recommend fusion of the hallux interphalangeal joint with removal of the sesamoid however she does not want to go through surgery.  I did discuss orthotics with her she does not want orthotics either so we talked about dancers pads and offloading so she would like to try this for a while if this works for her she will continue to do so otherwise we will discuss surgery in the future.

## 2019-08-25 ENCOUNTER — Telehealth: Payer: Self-pay | Admitting: *Deleted

## 2019-08-25 NOTE — Telephone Encounter (Signed)
"  I had surgery on my foot on December 5.  Dr. Al Corpus did my surgery.  I'm still fighting with the insurance to get some of the bills paid.  I chose a nerve block for my leg.  I didn't realize that was an extra type of procedure.  So, I'm going to submit an appeal.  They asked me to check with the doctor to see if there was any reason I had that.  When I went to the surgery, they basically said do you want this or that.  I thought it was relatively the same.  Anyway, if you would please give me a call back with some direction."

## 2019-08-26 NOTE — Telephone Encounter (Signed)
The nerve block that she is asking about helps prevent severe pain post operatively so not as much narcotic is used.  We prefer blocks to help manage the pain.

## 2019-08-29 NOTE — Telephone Encounter (Signed)
Left message to call back  

## 2019-09-01 ENCOUNTER — Ambulatory Visit: Payer: Managed Care, Other (non HMO) | Admitting: Internal Medicine

## 2019-09-01 ENCOUNTER — Other Ambulatory Visit: Payer: Self-pay

## 2019-09-01 ENCOUNTER — Encounter: Payer: Self-pay | Admitting: Internal Medicine

## 2019-09-01 VITALS — BP 122/80 | HR 86 | Temp 98.5°F | Resp 16 | Ht 64.0 in | Wt 193.6 lb

## 2019-09-01 DIAGNOSIS — M545 Low back pain, unspecified: Secondary | ICD-10-CM

## 2019-09-01 DIAGNOSIS — Z96642 Presence of left artificial hip joint: Secondary | ICD-10-CM | POA: Diagnosis not present

## 2019-09-01 MED ORDER — CYCLOBENZAPRINE HCL 5 MG PO TABS: 5.0000 mg | ORAL_TABLET | Freq: Three times a day (TID) | ORAL | 1 refills | Status: DC | PRN

## 2019-09-01 NOTE — Progress Notes (Signed)
Patient ID: Olivia Morgan, female    DOB: [birth year], 64 y.o.   MRN: [id number]  PCP: Jamelle Haring, MD  Chief Complaint  Patient presents with  . Hip Pain    left hip pain, was getting laundry out of the dryer and pain suddenly came on    Subjective:   Olivia Morgan is a 64 y.o. female, presents to clinic with CC of the following:  Chief Complaint  Patient presents with  . Hip Pain    left hip pain, was getting laundry out of the dryer and pain suddenly came on    HPI:  Patient is a 63 year old female patient of Orson Ape for follow-up today with left-sided low back pain  Patient was emptying laundry out of the dryer on Friday, and felt a pull, and had the sudden onset of pain in her left lower back.   The pain has persisted throughout the weekend and last night she had some difficulty sleeping due to the pain Pain felt in lower back on the left side, not over the spine at all, and more in a softball sized area than a focal area that she can put a finger on. She denies any left hip pain.  Denies any abdominal pains.  Some pain at rest and increased with movements No pain radiates down the legs No numbness, tingling or marked weakness in LE's, no foot drop No bowel or bladder dysfunction symptoms, no saddle anesthesia symptoms No fevers or other infectious symptoms of concern Has tried taking her Mobic -15 mg, heat topically to help with a heating pad and hot bath, also extra strength Tylenol.  No h/o major prior back injury/fracture/surgery, although has had multiple orthopedic procedures including a left hip replacement, 2 partial knee replacement and foot surgery. No cancer history No diabetes history  Tobacco-never smoker Osteoporosis Screening: DEXA scan in 2019 and was normal; taking vitamin D supplement OTC daily.   She is leaving for that short on Sunday for 9 days, and hoping to be better by then.  Patient Active Problem List    Diagnosis Date Noted  . Rectal bleeding   . 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  . History of total hip replacement 03/05/2017  . Allergic reaction 02/12/2017  . Dermatitis 02/12/2017  . Allergic rhinitis 04/16/2016  . Presence of right artificial knee joint 12/16/2015  . Obesity (BMI 30.0-34.9) 11/18/2015  . Vitamin D deficiency 11/11/2015  . Preventative health care 11/11/2015  . Mass of left lower leg 09/07/2015  . Adverse drug reaction 06/13/2015  . Greater trochanteric bursitis of left hip 06/05/2015  . Hammer toe of right foot 06/05/2015  . Neuropathy of both feet 06/05/2015  . 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 Thrombocytopenia (HCC) 04/05/2015  . Esophageal reflux 03/19/2015  . Need for shingles vaccine 03/14/2015  . Lower back pain 03/10/2015  . Dysphagia 03/10/2015  . Herpes simplex virus infection   . OA (osteoarthritis) of hip   . GERD (gastroesophageal reflux disease)   . Plantar fasciitis   . Depression   . Genital herpes   . Bilateral hearing loss   . Abnormal ECG 09/09/2014  . Shortness of breath 09/09/2014  . Hip pain 09/09/2012  . Other mechanical complication of other internal orthopedic device, implant, and graft 09/09/2012  . Medial collateral ligament sprain of knee 11/13/2011  . Other and unspecified derangement of medial meniscus 06/13/2011  . Primary  localized osteoarthrosis, lower leg 06/13/2011      Current Outpatient Medications:  .  acetaminophen (TYLENOL) 500 MG tablet, Take 500 mg by mouth every 6 (six) hours as needed., Disp: , Rfl:  .  buPROPion (WELLBUTRIN XL) 150 MG 24 hr tablet, TAKE 3 TABLETS BY MOUTH  DAILY, Disp: 270 tablet, Rfl: 3 .  Cholecalciferol (VITAMIN D-3 PO), Take 1,000 Units by mouth once a week. , Disp: , Rfl:  .  diphenhydrAMINE (BENADRYL) 25 MG tablet, Take 25 mg by mouth every 6 (six) hours as needed., Disp: , Rfl:  .  meloxicam (MOBIC) 15 MG tablet, Take 15 mg by  mouth daily., Disp: , Rfl:  .  RABEprazole (ACIPHEX) 20 MG tablet, TAKE 1 TABLET BY MOUTH  DAILY CAUTION PROLONGED USE MAY INCREASE RISK OF  PNEUMONIA COLITIS  OSTEOPOROSIS ANEMIA, Disp: 90 tablet, Rfl: 3 .  triamcinolone (NASACORT ALLERGY 24HR) 55 MCG/ACT AERO nasal inhaler, Place 2 sprays into the nose daily., Disp: , Rfl:  .  valACYclovir (VALTREX) 500 MG tablet, TAKE 1 TABLET BY MOUTH  DAILY, Disp: 90 tablet, Rfl: 1 .  VITAMIN E PO, Take by mouth once a week. , Disp: , Rfl:    Allergies  Allergen Reactions  . Levofloxacin Hives  . Morphine Nausea Only  . Celecoxib Other (See Comments)    SOB  . Celexa [Citalopram Hydrobromide]   . Cymbalta [Duloxetine Hcl] Hives  . Gabapentin Other (See Comments)    anger  . Levaquin [Levofloxacin In D5w] Hives  . Valium [Diazepam] Other (See Comments)    hallucinations  . Monistat [Miconazole] Rash  . Prednisone Anxiety     Past Surgical History:  Procedure Laterality Date  . CESAREAN SECTION    . COLONOSCOPY  2010  . COLONOSCOPY WITH PROPOFOL N/A 11/27/2018   Procedure: COLONOSCOPY WITH PROPOFOL;  Surgeon: Lin Landsman, MD;  Location: [place];  Service: Endoscopy;  Laterality: N/A;  . ENDOSCOPIC CONCHA BULLOSA RESECTION Left 05/23/2018   Procedure: ENDOSCOPIC CONCHA BULLOSA RESECTION;  Surgeon: Margaretha Sheffield, MD;  Location: [place];  Service: ENT;  Laterality: Left;  . FOOT SURGERY Right   . REPLACEMENT TOTAL KNEE  3/13   Dr[facility]Hallows at [facility]  . SEPTOPLASTY Bilateral 05/23/2018   Procedure: SEPTOPLASTY;  Surgeon: Margaretha Sheffield, MD;  Location: [place];  Service: ENT;  Laterality: Bilateral;  . TOTAL HIP ARTHROPLASTY Left 10/2008  . TURBINATE REDUCTION Bilateral 05/23/2018   Procedure: INFERIOR TURBINATE REDUCTION;  Surgeon: Margaretha Sheffield, MD;  Location: [place];  Service: ENT;  Laterality: Bilateral;  . UPPER GI ENDOSCOPY  1992     Family History  Problem Relation Age of  Onset  . Aneurysm Mother   . Stroke Mother   . Heart disease Mother   . Heart disease Father 14  . Hypertension Father   . Lung disease Father   . Heart disease Paternal Grandfather   . Panic disorder Brother   . Milk intolerance Daughter   . Polycystic ovary syndrome Daughter   . Obesity Brother   . Hyperlipidemia Brother   . Hypertension Brother   . Diabetes Brother   . Cancer Neg Hx   . COPD Neg Hx   . Breast cancer Neg Hx      Social History   Tobacco Use  . Smoking status: Never Smoker  . Smokeless tobacco: Never Used  Substance Use Topics  . Alcohol use: Yes    Comment: socially    With  staff assistance, above reviewed with the patient today.  ROS: As per HPI, otherwise no specific complaints on a limited and focused system review   No results found for this or any previous visit (from the past 72 hour(s)).   PHQ2/9: Depression screen [facility] 2/9 09/01/2019 12/17/2018 11/13/2018 09/23/2018 01/22/2018  Decreased Interest 0 0 0 0 0  Down, Depressed, Hopeless 0 0 0 0 0  PHQ - 2 Score 0 0 0 0 0  Altered sleeping 0 3 0 0 3  Tired, decreased energy 0 0 0 0 1  Change in appetite 0 0 0 0 1  Feeling bad or failure about yourself  0 0 0 0 0  Trouble concentrating 0 0 0 0 0  Moving slowly or fidgety/restless 0 0 0 0 0  Suicidal thoughts 0 0 0 0 0  PHQ-9 Score 0 3 0 0 5  Difficult doing work/chores Not difficult at all Not difficult at all Not difficult at all Not difficult at all Not difficult at all   PHQ-2/9 Result is neg  Fall Risk: Fall Risk  09/01/2019 12/17/2018 11/13/2018 09/23/2018 01/22/2018  Falls in the past year? 1 0 0 0 Yes  Number falls in past yr: 1 0 0 0 1  Injury with Fall? 1 0 0 - No  Follow up - Falls evaluation completed - - -      Objective:   Vitals:   09/01/19 1324  BP: 122/80  Pulse: 86  Resp: 16  Temp: 98.5 F (36.9 C)  TempSrc: Temporal  SpO2: (!) 86%  Weight: 193 lb 9.6 oz (87.8 kg)  Height: 5\' 4"  (1.626 m)    Body mass index is 33.23  kg/m.  Physical Exam   NAD, masked, pleasant HEENT - [place]/AT, sclera anicteric, PERRL, conj - non-inj'ed,  Neck - supple, no adenopathy,  Car - RRR without m/g/r Pulm- RR and effort normal at rest, CTA without wheeze or rales Abd - soft, NT, ND, no masses Back - no CVA tenderness,   Not markedly tender with palpation in left lower back, in the muscle bed region lateral to the lower lumbar spine   NTover spine proper, no marked spasm  No focal CVA tenderness No focal SI joint tenderness, with her discomfort felt in this region on the left and more superior to the SI joint proper as well No bruising or swelling             SLR negative, with some pain in lower back noted with testing             Mild pain with bringing left knee to chest when supine felt in the lower back Good strength in LE's on testing including with dorsi and plantar flexion of feet bilateral             Sensation intact to LT in bilteral LE's distal             DTR's 2+ and = in patella and achilles No tenderness with abduction and abduction of the left hip, and nontender in the left anterior hip region.  Nontender in the trochanteric bursa region on the left Neuro/psychiatric - affect was not flat, appropriate with conversation  Alert and oriented  Grossly non-focal - good strength on testing extremities, sensation intact to LT in distal extremities  Speech  normal   Results for orders placed or performed in visit on 01/03/19  Novel Coronavirus, NAA ([facility])   Specimen: Oropharyngeal(OP) collection in vial  transport medium   OROPHARYNGEA  TESTING  Result Value Ref Range   SARS-CoV-2, NAA Not Detected Not Detected       Assessment & Plan:   1. Acute left-sided low back pain without sciatica Educated, definitely feel more mechanical and likely muscle based with a strain history.  No radicular signs or symptoms of concern down the lower extremity, and feel very unlikely discogenic source noted X-ray discussed  - Not felt indicated acutely given above,   Relative rest until symptoms improving Contrast therapy reviewed with ice often recommended in the first 48 hours and then can use warm compresses, followed by gentle ROM (not ballistic and exercises reviewed), followed by ice after to help lessen inflammation,  Can continue her Mobic product once daily, also can use extra strength Tylenol as needed to help with discomfort Flexeril - 5 mg up to tid prn and warned may make drowsy and take predominantly at bedtime (not to drive if taking), can take 2 tablets at bedtime pending her response with 1.  She notes she has had Flexeril before and has done okay with that. Also can use a topical patch like a lidocaine 4% patch or a product with capsaicin which can be helpful with symptoms. - cyclobenzaprine (FLEXERIL) 5 MG tablet; Take 1 tablet (5 mg total) by mouth 3 (three) times daily as needed for muscle spasms. May increase to two tabs at bedtime pending response  Dispense: 30 tablet; Refill: 1  2. History of total hip replacement, left Feel comfortable there is no involvement of the left hip presently.    Patient is to follow-up if not improving or worsening despite the above.     Jamelle Haring, MD 09/01/19 1:30 PM

## 2019-09-01 NOTE — Patient Instructions (Signed)
Acute Back Pain, Adult Acute back pain is sudden and usually short-lived. It is often caused by an injury to the muscles and tissues in the back. The injury may result from:  A muscle or ligament getting overstretched or torn (strained). Ligaments are tissues that connect bones to each other. Lifting something improperly can cause a back strain.  Wear and tear (degeneration) of the spinal disks. Spinal disks are circular tissue that provides cushioning between the bones of the spine (vertebrae).  Twisting motions, such as while playing sports or doing yard work.  A hit to the back.  Arthritis. You may have a physical exam, lab tests, and imaging tests to find the cause of your pain. Acute back pain usually goes away with rest and home care. Follow these instructions at home: Managing pain, stiffness, and swelling  Take over-the-counter and prescription medicines only as told by your health care provider.  Your health care provider may recommend applying ice during the first 24-48 hours after your pain starts. To do this: ? Put ice in a plastic bag. ? Place a towel between your skin and the bag. ? Leave the ice on for 20 minutes, 2-3 times a day.  If directed, apply heat to the affected area as often as told by your health care provider. Use the heat source that your health care provider recommends, such as a moist heat pack or a heating pad. ? Place a towel between your skin and the heat source. ? Leave the heat on for 20-30 minutes. ? Remove the heat if your skin turns bright red. This is especially important if you are unable to feel pain, heat, or cold. You have a greater risk of getting burned. Activity   Do not stay in bed. Staying in bed for more than 1-2 days can delay your recovery.  Sit up and stand up straight. Avoid leaning forward when you sit, or hunching over when you stand. ? If you work at a desk, sit close to it so you do not need to lean over. Keep your chin tucked  in. Keep your neck drawn back, and keep your elbows bent at a right angle. Your arms should look like the letter "L." ? Sit high and close to the steering wheel when you drive. Add lower back (lumbar) support to your car seat, if needed.  Take short walks on even surfaces as soon as you are able. Try to increase the length of time you walk each day.  Do not sit, drive, or stand in one place for more than 30 minutes at a time. Sitting or standing for long periods of time can put stress on your back.  Do not drive or use heavy machinery while taking prescription pain medicine.  Use proper lifting techniques. When you bend and lift, use positions that put less stress on your back: ? Bend your knees. ? Keep the load close to your body. ? Avoid twisting.  Exercise regularly as told by your health care provider. Exercising helps your back heal faster and helps prevent back injuries by keeping muscles strong and flexible.  Work with a physical therapist to make a safe exercise program, as recommended by your health care provider. Do any exercises as told by your physical therapist. Lifestyle  Maintain a healthy weight. Extra weight puts stress on your back and makes it difficult to have good posture.  Avoid activities or situations that make you feel anxious or stressed. Stress and anxiety increase muscle   tension and can make back pain worse. Learn ways to manage anxiety and stress, such as through exercise. General instructions  Sleep on a firm mattress in a comfortable position. Try lying on your side with your knees slightly bent. If you lie on your back, put a pillow under your knees.  Follow your treatment plan as told by your health care provider. This may include: ? Cognitive or behavioral therapy. ? Acupuncture or massage therapy. ? Meditation or yoga. Contact a health care provider if:  You have pain that is not relieved with rest or medicine.  You have increasing pain going down  into your legs or buttocks.  Your pain does not improve after 2 weeks.  You have pain at night.  You lose weight without trying.  You have a fever or chills. Get help right away if:  You develop new bowel or bladder control problems.  You have unusual weakness or numbness in your arms or legs.  You develop nausea or vomiting.  You develop abdominal pain.  You feel faint. Summary  Acute back pain is sudden and usually short-lived.  Use proper lifting techniques. When you bend and lift, use positions that put less stress on your back.  Take over-the-counter and prescription medicines and apply heat or ice as directed by your health care provider. This information is not intended to replace advice given to you by your health care provider. Make sure you discuss any questions you have with your health care provider. Document Revised: 07/16/2018 Document Reviewed: 11/08/2016 Elsevier Patient Education  2020 [employer]  

## 2019-09-03 ENCOUNTER — Ambulatory Visit: Payer: Managed Care, Other (non HMO) | Admitting: Internal Medicine

## 2019-11-12 ENCOUNTER — Ambulatory Visit: Payer: Managed Care, Other (non HMO) | Admitting: Internal Medicine

## 2019-11-12 ENCOUNTER — Encounter: Payer: Self-pay | Admitting: Internal Medicine

## 2019-11-12 ENCOUNTER — Other Ambulatory Visit: Payer: Self-pay

## 2019-11-12 VITALS — BP 126/80 | HR 98 | Temp 98.3°F | Resp 16 | Ht 64.0 in | Wt 185.9 lb

## 2019-11-12 DIAGNOSIS — S39011A Strain of muscle, fascia and tendon of abdomen, initial encounter: Secondary | ICD-10-CM

## 2019-11-12 NOTE — Progress Notes (Signed)
Patient ID: Olivia Morgan, female    DOB: May 29, 1955, 64 y.o.   MRN: [id number]  PCP: Danelle Berry, PA-C  Chief Complaint  Patient presents with  . Pain over belly button    lifted a 40lb bag of salt and hurt herself about a week ago, painful now only when she coughs or sneezes    Subjective:   Olivia Morgan is a 64 y.o. female, presents to clinic with CC of the following:  Chief Complaint  Patient presents with  . Pain over belly button    lifted a 40lb bag of salt and hurt herself about a week ago, painful now only when she coughs or sneezes    HPI:  Patient is a 64 year old female patient of Danelle Berry My last visit with her was 09/01/2019 for low back pain. Presents today with pain above the umbilicus that is worsened with sneezing.  She noted about a week ago, she lifted 3 bags of salt which were about 40 pounds each, and struggled some with this due to their weight.  She did not notice marked pain after she did this. The next day, when she sneezed, she noted some pain in the upper abdomen above her bellybutton area, that was sharp and quite painful, but went away quickly after the sneeze.  She continued to have symptoms only with sneezing, not otherwise, and in the past couple of days, she has noted some soreness that has persisted after the sneeze for 30 to 60 minutes.  Then it goes away. She denies any bulge or swelling that she is noted, no bruising, has had no nausea or vomiting, no change in bowel habits with no chronic constipation or diarrhea, no dark or black stools or bleeding per rectum, she does have reflux at baseline but that has not worsened since this started. Presently, she has no discomfort at rest, or getting up and walking around, and she only notes pain when she sneezes.  She does take a daily NSAID, low back, and also takes a PPI regularly, AcipHex.  Patient Active Problem List   Diagnosis Date Noted  . Rectal bleeding   . Varicose veins of both  lower extremities with inflammation 10/30/2018  . History of total hip replacement 03/05/2017  . Allergic reaction 02/12/2017  . Dermatitis 02/12/2017  . Allergic rhinitis 04/16/2016  . Presence of right artificial knee joint 12/16/2015  . Obesity (BMI 30.0-34.9) 11/18/2015  . Vitamin D deficiency 11/11/2015  . Preventative health care 11/11/2015  . Mass of left lower leg 09/07/2015  . Adverse drug reaction 06/13/2015  . Greater trochanteric bursitis of left hip 06/05/2015  . Hammer toe of right foot 06/05/2015  . Neuropathy of both feet 06/05/2015  . 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 Thrombocytopenia (HCC) 04/05/2015  . Esophageal reflux 03/19/2015  . Need for shingles vaccine 03/14/2015  . Lower back pain 03/10/2015  . Dysphagia 03/10/2015  . Herpes simplex virus infection   . OA (osteoarthritis) of hip   . GERD (gastroesophageal reflux disease)   . Plantar fasciitis   . Depression   . Genital herpes   . Bilateral hearing loss   . Abnormal ECG 09/09/2014  . Shortness of breath 09/09/2014  . Hip pain 09/09/2012  . Other mechanical complication of other internal orthopedic device, implant, and graft 09/09/2012  . Medial collateral ligament sprain of knee 11/13/2011  . Other and unspecified derangement of medial meniscus 06/13/2011  . Primary localized osteoarthrosis,  lower leg 06/13/2011      Current Outpatient Medications:  .  buPROPion (WELLBUTRIN XL) 150 MG 24 hr tablet, TAKE 3 TABLETS BY MOUTH  DAILY, Disp: 270 tablet, Rfl: 3 .  Cholecalciferol (VITAMIN D-3 PO), Take 1,000 Units by mouth once a week. , Disp: , Rfl:  .  diphenhydrAMINE (BENADRYL) 25 MG tablet, Take 25 mg by mouth every 6 (six) hours as needed., Disp: , Rfl:  .  meloxicam (MOBIC) 15 MG tablet, Take 15 mg by mouth daily., Disp: , Rfl:  .  RABEprazole (ACIPHEX) 20 MG tablet, TAKE 1 TABLET BY MOUTH  DAILY CAUTION PROLONGED USE MAY INCREASE RISK OF  PNEUMONIA COLITIS  OSTEOPOROSIS ANEMIA,  Disp: 90 tablet, Rfl: 3 .  triamcinolone (NASACORT ALLERGY 24HR) 55 MCG/ACT AERO nasal inhaler, Place 2 sprays into the nose daily., Disp: , Rfl:  .  valACYclovir (VALTREX) 500 MG tablet, TAKE 1 TABLET BY MOUTH  DAILY, Disp: 90 tablet, Rfl: 1 .  VITAMIN E PO, Take by mouth once a week. , Disp: , Rfl:    Allergies  Allergen Reactions  . Levofloxacin Hives  . Morphine Nausea Only  . Celecoxib Other (See Comments)    SOB  . Celexa [Citalopram Hydrobromide]   . Cymbalta [Duloxetine Hcl] Hives  . Gabapentin Other (See Comments)    anger  . Levaquin [Levofloxacin In D5w] Hives  . Valium [Diazepam] Other (See Comments)    hallucinations  . Monistat [Miconazole] Rash  . Prednisone Anxiety     Past Surgical History:  Procedure Laterality Date  . CESAREAN SECTION    . COLONOSCOPY  2010  . COLONOSCOPY WITH PROPOFOL N/A 11/27/2018   Procedure: COLONOSCOPY WITH PROPOFOL;  Surgeon: Toney Reil, MD;  Location: [facility];  Service: Endoscopy;  Laterality: N/A;  . ENDOSCOPIC CONCHA BULLOSA RESECTION Left 05/23/2018   Procedure: ENDOSCOPIC CONCHA BULLOSA RESECTION;  Surgeon: Vernie Murders, MD;  Location: [facility];  Service: ENT;  Laterality: Left;  . FOOT SURGERY Right   . REPLACEMENT TOTAL KNEE  3/13   Dr.Hallows at [facility]  . SEPTOPLASTY Bilateral 05/23/2018   Procedure: SEPTOPLASTY;  Surgeon: Vernie Murders, MD;  Location: [facility];  Service: ENT;  Laterality: Bilateral;  . TOTAL HIP ARTHROPLASTY Left 10/2008  . TURBINATE REDUCTION Bilateral 05/23/2018   Procedure: INFERIOR TURBINATE REDUCTION;  Surgeon: Vernie Murders, MD;  Location: [facility];  Service: ENT;  Laterality: Bilateral;  . UPPER GI ENDOSCOPY  1992     Family History  Problem Relation Age of Onset  . Aneurysm Mother   . Stroke Mother   . Heart disease Mother   . Heart disease Father 46  . Hypertension Father   . Lung disease Father   . Heart disease Paternal  Grandfather   . Panic disorder Brother   . Milk intolerance Daughter   . Polycystic ovary syndrome Daughter   . Obesity Brother   . Hyperlipidemia Brother   . Hypertension Brother   . Diabetes Brother   . Cancer Neg Hx   . COPD Neg Hx   . Breast cancer Neg Hx      Social History   Tobacco Use  . Smoking status: Never Smoker  . Smokeless tobacco: Never Used  Substance Use Topics  . Alcohol use: Yes    Comment: socially    With staff assistance, above reviewed with the patient today.  ROS: As per HPI, otherwise no specific complaints on a limited and focused system review  No results found for this or any previous visit (from the past 72 hour(s)).   PHQ2/9: Depression screen [facility] 2/9 11/12/2019 09/01/2019 12/17/2018 11/13/2018 09/23/2018  Decreased Interest 0 0 0 0 0  Down, Depressed, Hopeless 0 0 0 0 0  PHQ - 2 Score 0 0 0 0 0  Altered sleeping 0 0 3 0 0  Tired, decreased energy 0 0 0 0 0  Change in appetite 0 0 0 0 0  Feeling bad or failure about yourself  0 0 0 0 0  Trouble concentrating 0 0 0 0 0  Moving slowly or fidgety/restless 0 0 0 0 0  Suicidal thoughts 0 0 0 0 0  PHQ-9 Score 0 0 3 0 0  Difficult doing work/chores - Not difficult at all Not difficult at all Not difficult at all Not difficult at all   PHQ-2/9 Result is neg   Fall Risk: Fall Risk  11/12/2019 09/01/2019 12/17/2018 11/13/2018 09/23/2018  Falls in the past year? 0 1 0 0 0  Number falls in past yr: 0 1 0 0 0  Injury with Fall? 0 1 0 0 -  Follow up - - Falls evaluation completed - -      Objective:   Vitals:   11/12/19 1310  BP: 126/80  Pulse: 98  Resp: 16  Temp: 98.3 F (36.8 C)  TempSrc: Temporal  SpO2: 96%  Weight: 185 lb 14.4 oz (84.3 kg)  Height: 5\' 4"  (1.626 m)    Body mass index is 31.91 kg/m.  Physical Exam   NAD, masked, pleasant, looks well HEENT - [place]/AT, sclera anicteric, Abd - soft, noted a very minimal discomfort with deep palpation midline above the umbilicus and slightly  more right of midline, although no marked pain with palpation, no rebound or guarding, no bulge or swelling, ND, BS+,  no masses, with doing a partial sit up, no increased pain or bulge or swelling noted. Back - no CVA tenderness Neuro/psychiatric - affect was not flat, appropriate with conversation  Alert with normal speech   Results for orders placed or performed in visit on 01/03/19  Novel Coronavirus, NAA ([facility])   Specimen: Oropharyngeal(OP) collection in vial transport medium   OROPHARYNGEA  TESTING  Result Value Ref Range   SARS-CoV-2, NAA Not Detected Not Detected       Assessment & Plan:   1. Strain of abdominal muscle, initial encounter Discussed with patient that this is likely an abdominal muscle strain, doubt any tear concern with no evidence of hernia clinically.  Has no bulge or swelling. Educated on this, Can apply cold topically when soreness is present, Also can use Tylenol type products as needed in the short-term, would not take further NSAIDs as needed with her history. Trying to avoid increasing intra-abdominal pressures including avoiding Valsalva type maneuvers, avoiding any type of lifting activities in the short-term, hard to avoid sneezes although noted would be helpful, and continue to closely monitor.  If she does develop swelling, bulging, or more severe pains, she needs to be seen again for follow-up, or if she has any concerns with change in bowel habits also noted. She has tried to avoid any lifting activities in the recent past which needs to continue.          01/05/19, MD 11/12/19 1:16 PM

## 2019-11-26 ENCOUNTER — Other Ambulatory Visit: Payer: Self-pay | Admitting: Internal Medicine

## 2019-11-26 ENCOUNTER — Other Ambulatory Visit: Payer: Self-pay | Admitting: Family Medicine

## 2019-11-27 ENCOUNTER — Other Ambulatory Visit: Payer: Self-pay

## 2019-11-27 DIAGNOSIS — Z1231 Encounter for screening mammogram for malignant neoplasm of breast: Secondary | ICD-10-CM

## 2019-12-10 ENCOUNTER — Other Ambulatory Visit: Payer: Self-pay

## 2019-12-10 MED ORDER — VALACYCLOVIR HCL 500 MG PO TABS: 500.0000 mg | ORAL_TABLET | Freq: Every day | ORAL | 1 refills | Status: DC

## 2019-12-18 ENCOUNTER — Encounter: Payer: Managed Care, Other (non HMO) | Admitting: Family Medicine

## 2019-12-19 ENCOUNTER — Ambulatory Visit
Admission: RE | Admit: 2019-12-19 | Discharge: 2019-12-19 | Disposition: A | Discharge status: Home / Self Care | Payer: Managed Care, Other (non HMO) | Source: Ambulatory Visit | Attending: Family Medicine | Admitting: Family Medicine

## 2019-12-19 DIAGNOSIS — Z1231 Encounter for screening mammogram for malignant neoplasm of breast: Secondary | ICD-10-CM | POA: Diagnosis present

## 2019-12-23 ENCOUNTER — Other Ambulatory Visit: Payer: Self-pay | Admitting: Family Medicine

## 2019-12-23 DIAGNOSIS — R928 Other abnormal and inconclusive findings on diagnostic imaging of breast: Secondary | ICD-10-CM

## 2019-12-26 NOTE — Progress Notes (Signed)
Patient: Olivia Morgan, Female    DOB: 1956-01-04, 64 y.o.   MRN: [id number] Olivia Berry, PA-C Visit Date: 12/29/2019  Today's Provider: Danelle Berry, PA-C   Chief Complaint  Patient presents with  . Annual Exam   Subjective:   Annual physical exam:  Olivia Morgan is a 64 y.o. female who presents today for complete physical exam:  Exercise/Activity:  Not formally exercising, works from home, inside a lot Diet/nutrition:    Energy manager - stress eating Sleep:  Not sleeping well since 2010  Hate covid, hate wearing masks, hates working from home She feels like shes working all the time    USPSTF grade A and B recommendations - reviewed and addressed today  Depression:  Phq 9 completed today by patient, was reviewed by me with patient in the room PHQ score is a little mildly positive, pt feels depressed/angry/anxious Wellbutrin 150 mg XL takes 1-2 tabs per day written as 3 per day   PHQ 2/9 Scores 12/29/2019 11/12/2019 09/01/2019 12/17/2018  PHQ - 2 Score 1 0 0 0  PHQ- 9 Score 5 0 0 3   Depression screen [facility] 2/9 12/29/2019 11/12/2019 09/01/2019 12/17/2018 11/13/2018  Decreased Interest 0 0 0 0 0  Down, Depressed, Hopeless 1 0 0 0 0  PHQ - 2 Score 1 0 0 0 0  Altered sleeping 3 0 0 3 0  Tired, decreased energy 1 0 0 0 0  Change in appetite 0 0 0 0 0  Feeling bad or failure about yourself  0 0 0 0 0  Trouble concentrating 0 0 0 0 0  Moving slowly or fidgety/restless 0 0 0 0 0  Suicidal thoughts 0 0 0 0 0  PHQ-9 Score 5 0 0 3 0  Difficult doing work/chores Not difficult at all - Not difficult at all Not difficult at all Not difficult at all  Some recent data might be hidden    Alcohol screening:   Office Visit from 12/29/2019 in [facility]  AUDIT-C Score 0      Immunizations and Health Maintenance: Health Maintenance  Topic Date Due  . Hepatitis C Screening  Never done  . HIV Screening  Never done  . PNA vac Low Risk Adult (1 of 2 - PCV13) Never  done  . DEXA SCAN  12/06/2019  . INFLUENZA VACCINE  07/08/2020 (Originally 11/09/2019)  . MAMMOGRAM  12/18/2020  . TETANUS/TDAP  04/10/2021  . COLONOSCOPY  11/26/2028  . COVID-19 Vaccine  Completed     Hep C Screening: ordered  STD testing and prevention (HIV/chl/gon/syphilis):  see above, no additional testing desired by pt today /HIV ordered  Intimate partner violence:  Single now  Sexual History/Pain during Intercourse: Divorced - not currently sexually active  Menstrual History/LMP/Abnormal Bleeding: went through menopause around 49 No AUB/spotting  No LMP recorded. Patient is postmenopausal.  Incontinence Symptoms:   Breast cancer:  Did mammogram was abnormal needed additional imaging Breast exam done today, no sx or concerns freom  Last Mammogram: *see HM list above   Cervical cancer screening: completed Pt does not have family hx of cancers - breast, ovarian, uterine, colon:     Osteoporosis:   Discussion on osteoporosis per age, including high calcium and vitamin D supplementation, weight bearing exercises Pt is currently supplementing with daily calcium/Vit D a few times a week Last Bone scan 11/2017  Skin cancer:  Dermatology Dr. Cheree Ditto- recent biopsy  Hx of skin CA -  NO  Discussed atypical lesions   Colorectal cancer:   Colonoscopy is completed  Discussed concerning signs and sx of CRC, pt denies melena, hematochezia, change in bowel patterns or caliber  Lung cancer:   Low Dose CT Chest recommended if Age 31-80 years, 30 pack-year currently smoking OR have quit w/in 15years. Patient does not qualify.    Social History   Tobacco Use  . Smoking status: Never Smoker  . Smokeless tobacco: Never Used  Vaping Use  . Vaping Use: Never used  Substance Use Topics  . Alcohol use: Yes    Comment: socially  . Drug use: No       [facility] Visit from 12/29/2019 in [facility]  AUDIT-C Score 0      Family History  Problem Relation Age of  Onset  . Aneurysm Mother   . Stroke Mother   . Heart disease Mother   . Heart disease Father 45  . Hypertension Father   . Lung disease Father   . Heart disease Paternal Grandfather   . Panic disorder Brother   . Milk intolerance Daughter   . Polycystic ovary syndrome Daughter   . Obesity Brother   . Hyperlipidemia Brother   . Hypertension Brother   . Diabetes Brother   . Cancer Neg Hx   . COPD Neg Hx   . Breast cancer Neg Hx      Blood pressure/Hypertension: BP Readings from Last 3 Encounters:  12/29/19 120/82  11/12/19 126/80  09/01/19 122/80    Weight/Obesity: Wt Readings from Last 3 Encounters:  12/29/19 186 lb 12.8 oz (84.7 kg)  11/12/19 185 lb 14.4 oz (84.3 kg)  09/01/19 193 lb 9.6 oz (87.8 kg)   BMI Readings from Last 3 Encounters:  12/29/19 32.06 kg/m  11/12/19 31.91 kg/m  09/01/19 33.23 kg/m     Lipids:  Lab Results  Component Value Date   CHOL 153 11/22/2017   CHOL 163 11/08/2016   CHOL 162 11/11/2015   Lab Results  Component Value Date   HDL 71 11/22/2017   HDL 85 11/08/2016   HDL 69 11/11/2015   Lab Results  Component Value Date   LDLCALC 68 11/22/2017   LDLCALC 62 11/08/2016   LDLCALC 66 11/11/2015   Lab Results  Component Value Date   TRIG 70 11/22/2017   TRIG 79 11/08/2016   TRIG 135 11/11/2015   Lab Results  Component Value Date   CHOLHDL 2.2 11/22/2017   CHOLHDL 1.9 11/08/2016   CHOLHDL 2.3 11/11/2015   No results found for: LDLDIRECT Based on the results of lipid panel his/her cardiovascular risk factor ( using [facility] )  in the next 10 years is: The 10-year ASCVD risk score Olivia Morgan., et al., 2013) is: 3.8%   Values used to calculate the score:     Age: 75 years     Sex: Female     Is Non-Hispanic African American: No     Diabetic: No     Tobacco smoker: No     Systolic Blood Pressure: 120 mmHg     Is BP treated: No     HDL Cholesterol: 71 mg/dL     Total Cholesterol: 153 mg/dL Glucose:  Glucose  Date  Value Ref Range Status  11/13/2018 111 (H) 65 - 99 mg/dL Final  84/13/2440 86 65 - 99 mg/dL Final  02/04/2535 90 65 - 99 mg/dL Final   Hypertension: BP Readings from Last 3 Encounters:  12/29/19 120/82  11/12/19 126/80  09/01/19 122/80   Obesity: Wt Readings from Last 3 Encounters:  12/29/19 186 lb 12.8 oz (84.7 kg)  11/12/19 185 lb 14.4 oz (84.3 kg)  09/01/19 193 lb 9.6 oz (87.8 kg)   BMI Readings from Last 3 Encounters:  12/29/19 32.06 kg/m  11/12/19 31.91 kg/m  09/01/19 33.23 kg/m      Advanced Care Planning:  A voluntary discussion about advance care planning including the explanation and discussion of advance directives.    Social History      She        Social History   Socioeconomic History  . Marital status: Divorced    Spouse name: Not on file  . Number of children: 1  . Years of education: Not on file  . Highest education level: Not on file  Occupational History  . Not on file  Tobacco Use  . Smoking status: Never Smoker  . Smokeless tobacco: Never Used  Vaping Use  . Vaping Use: Never used  Substance and Sexual Activity  . Alcohol use: Yes    Comment: socially  . Drug use: No  . Sexual activity: Yes    Birth control/protection: Post-menopausal  Other Topics Concern  . Not on file  Social History Narrative  . Not on file   Social Determinants of Health   Financial Resource Strain:   . Difficulty of Paying Living Expenses: Not on file  Food Insecurity:   . Worried About Programme researcher, broadcasting/film/video in the Last Year: Not on file  . Ran Out of Food in the Last Year: Not on file  Transportation Needs:   . Lack of Transportation (Medical): Not on file  . Lack of Transportation (Non-Medical): Not on file  Physical Activity:   . Days of Exercise per Week: Not on file  . Minutes of Exercise per Session: Not on file  Stress:   . Feeling of Stress : Not on file  Social Connections:   . Frequency of Communication with Friends and Family: Not on file    . Frequency of Social Gatherings with Friends and Family: Not on file  . Attends Religious Services: Not on file  . Active Member of Clubs or Organizations: Not on file  . Attends Banker Meetings: Not on file  . Marital Status: Not on file    Family History        Family History  Problem Relation Age of Onset  . Aneurysm Mother   . Stroke Mother   . Heart disease Mother   . Heart disease Father 76  . Hypertension Father   . Lung disease Father   . Heart disease Paternal Grandfather   . Panic disorder Brother   . Milk intolerance Daughter   . Polycystic ovary syndrome Daughter   . Obesity Brother   . Hyperlipidemia Brother   . Hypertension Brother   . Diabetes Brother   . Cancer Neg Hx   . COPD Neg Hx   . Breast cancer Neg Hx     Patient Active Problem List   Diagnosis Date Noted  . Rectal bleeding   . 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  . History of total hip replacement 03/05/2017  . Allergic reaction 02/12/2017  . Dermatitis 02/12/2017  . Allergic rhinitis 04/16/2016  . Presence of right artificial knee joint 12/16/2015  . Obesity (BMI 30.0-34.9) 11/18/2015  . Vitamin D deficiency 11/11/2015  . Preventative health care 11/11/2015  . Mass of left lower leg  09/07/2015  . Adverse drug reaction 06/13/2015  . Greater trochanteric bursitis of left hip 06/05/2015  . Hammer toe of right foot 06/05/2015  . Neuropathy of both feet 06/05/2015  . 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 Thrombocytopenia (HCC) 04/05/2015  . Esophageal reflux 03/19/2015  . Need for shingles vaccine 03/14/2015  . Lower back pain 03/10/2015  . Dysphagia 03/10/2015  . Herpes simplex virus infection   . OA (osteoarthritis) of hip   . GERD (gastroesophageal reflux disease)   . Plantar fasciitis   . Depression   . Genital herpes   . Bilateral hearing loss   . Abnormal ECG 09/09/2014  . Shortness of breath 09/09/2014  . Hip  pain 09/09/2012  . Other mechanical complication of other internal orthopedic device, implant, and graft 09/09/2012  . Medial collateral ligament sprain of knee 11/13/2011  . Other and unspecified derangement of medial meniscus 06/13/2011  . Primary localized osteoarthrosis, lower leg 06/13/2011    Past Surgical History:  Procedure Laterality Date  . CESAREAN SECTION    . COLONOSCOPY  2010  . COLONOSCOPY WITH PROPOFOL N/A 11/27/2018   Procedure: COLONOSCOPY WITH PROPOFOL;  Surgeon: Toney Reil, MD;  Location: [facility];  Service: Endoscopy;  Laterality: N/A;  . ENDOSCOPIC CONCHA BULLOSA RESECTION Left 05/23/2018   Procedure: ENDOSCOPIC CONCHA BULLOSA RESECTION;  Surgeon: Vernie Murders, MD;  Location: [facility];  Service: ENT;  Laterality: Left;  . FOOT SURGERY Right   . REPLACEMENT TOTAL KNEE  3/13   Dr[facility]Hallows at [facility]  . SEPTOPLASTY Bilateral 05/23/2018   Procedure: SEPTOPLASTY;  Surgeon: Vernie Murders, MD;  Location: [facility];  Service: ENT;  Laterality: Bilateral;  . TOTAL HIP ARTHROPLASTY Left 10/2008  . TURBINATE REDUCTION Bilateral 05/23/2018   Procedure: INFERIOR TURBINATE REDUCTION;  Surgeon: Vernie Murders, MD;  Location: [facility];  Service: ENT;  Laterality: Bilateral;  . UPPER GI ENDOSCOPY  1992     Current Outpatient Medications:  .  buPROPion (WELLBUTRIN XL) 150 MG 24 hr tablet, TAKE 3 TABLETS BY MOUTH  DAILY, Disp: 270 tablet, Rfl: 3 .  Cholecalciferol (VITAMIN D-3 PO), Take 1,000 Units by mouth once a week. , Disp: , Rfl:  .  diphenhydrAMINE (BENADRYL) 25 MG tablet, Take 25 mg by mouth every 6 (six) hours as needed., Disp: , Rfl:  .  meloxicam (MOBIC) 15 MG tablet, Take 15 mg by mouth daily., Disp: , Rfl:  .  RABEprazole (ACIPHEX) 20 MG tablet, TAKE 1 TABLET BY MOUTH  DAILY CAUTION PROLONGED USE MAY INCREASE RISK OF  PNEUMONIA COLITIS  OSTEOPOROSIS ANEMIA, Disp: 90 tablet, Rfl: 3 .  triamcinolone (NASACORT ALLERGY  24HR) 55 MCG/ACT AERO nasal inhaler, Place 2 sprays into the nose daily., Disp: , Rfl:  .  valACYclovir (VALTREX) 500 MG tablet, Take 1 tablet (500 mg total) by mouth daily., Disp: 90 tablet, Rfl: 1 .  VITAMIN E PO, Take by mouth once a week. , Disp: , Rfl:   Allergies  Allergen Reactions  . Levofloxacin Hives  . Morphine Nausea Only  . Celecoxib Other (See Comments)    SOB  . Celexa [Citalopram Hydrobromide]   . Cymbalta [Duloxetine Hcl] Hives  . Gabapentin Other (See Comments)    anger  . Levaquin [Levofloxacin In D5w] Hives  . Valium [Diazepam] Other (See Comments)    hallucinations  . Monistat [Miconazole] Rash  . Prednisone Anxiety    Patient Care Team: Olivia Berry, PA-C as PCP - General (  Family Medicine) Lemar Livings, Merrily Pew, MD as Consulting Physician (General Surgery) Lada, Janit Bern, MD as Referring Physician [facility] Medicine) Hallows, Rhett K, MD (Orthopedic Surgery)  Review of Systems  Constitutional: Negative.  Negative for activity change, appetite change, fatigue and unexpected weight change.  HENT: Negative.   Eyes: Negative.   Respiratory: Negative.  Negative for shortness of breath.   Cardiovascular: Negative.  Negative for chest pain, palpitations and leg swelling.  Gastrointestinal: Negative.  Negative for abdominal pain and blood in stool.  Endocrine: Negative.   Genitourinary: Negative.   Musculoskeletal: Negative.  Negative for arthralgias, gait problem, joint swelling and myalgias.  Skin: Negative.  Negative for pallor and rash.  Allergic/Immunologic: Negative.   Neurological: Negative.  Negative for syncope and weakness.  Hematological: Negative.   Psychiatric/Behavioral: Negative for suicidal ideas.  All other systems reviewed and are negative.    I personally reviewed active problem list, medication list, allergies, family history, social history, health maintenance, notes from last encounter, lab results, imaging with the patient/caregiver  today.        Objective:   Vitals:  Vitals:   12/29/19 1026  BP: 120/82  Pulse: 76  Resp: 16  Temp: 98 F (36.7 C)  TempSrc: Oral  SpO2: 95%  Weight: 186 lb 12.8 oz (84.7 kg)  Height:  (1.626 m)    Body mass index is 32.06 kg/m.  Physical Exam Vitals and nursing note reviewed.  Constitutional:      General: She is not in acute distress.    Appearance: Normal appearance. She is well-developed. She is obese. She is not ill-appearing, toxic-appearing or diaphoretic.     Interventions: Face mask in place.  HENT:     Head: Normocephalic and atraumatic.     Right Ear: External ear normal.     Left Ear: External ear normal.  Eyes:     General: Lids are normal. No scleral icterus.       Right eye: No discharge.        Left eye: No discharge.     Conjunctiva/sclera: Conjunctivae normal.  Neck:     Thyroid: No thyroid mass, thyromegaly or thyroid tenderness.     Trachea: Phonation normal. No tracheal deviation.  Cardiovascular:     Rate and Rhythm: Normal rate and regular rhythm.     Pulses: Normal pulses.          Radial pulses are 2+ on the right side and 2+ on the left side.       Posterior tibial pulses are 2+ on the right side and 2+ on the left side.     Heart sounds: Normal heart sounds. No murmur heard.  No friction rub. No gallop.   Pulmonary:     Effort: Pulmonary effort is normal. No respiratory distress.     Breath sounds: Normal breath sounds. No stridor. No wheezing, rhonchi or rales.  Chest:     Chest wall: No tenderness.     Breasts:        Right: Normal. No swelling, bleeding, inverted nipple, mass, nipple discharge, skin change or tenderness.        Left: Normal. No swelling, bleeding, inverted nipple, mass, nipple discharge, skin change or tenderness.  Abdominal:     General: Bowel sounds are normal. There is no distension.     Palpations: Abdomen is soft.  Musculoskeletal:     Right lower leg: No edema.     Left lower leg: No edema.   Lymphadenopathy:  Upper Body:     Right upper body: No supraclavicular, axillary or pectoral adenopathy.     Left upper body: No supraclavicular, axillary or pectoral adenopathy.  Skin:    General: Skin is warm and dry.     Coloration: Skin is not jaundiced or pale.     Findings: No rash.  Neurological:     Mental Status: She is alert.     Motor: No abnormal muscle tone.     Gait: Gait normal.  Psychiatric:        Mood and Affect: Mood normal.        Speech: Speech normal.        Behavior: Behavior normal.       Fall Risk: Fall Risk  12/29/2019 11/12/2019 09/01/2019 12/17/2018 11/13/2018  Falls in the past year? 0 0 1 0 0  Number falls in past yr: 0 0 1 0 0  Injury with Fall? 0 0 1 0 0  Follow up - - - Falls evaluation completed -    Functional Status Survey: Is the patient deaf or have difficulty hearing?: Yes Does the patient have difficulty seeing, even when wearing glasses/contacts?: No Does the patient have difficulty concentrating, remembering, or making decisions?: No Does the patient have difficulty walking or climbing stairs?: No Does the patient have difficulty dressing or bathing?: No Does the patient have difficulty doing errands alone such as visiting a doctor's office or shopping?: No   Assessment & Plan:    CPE completed today  . USPSTF grade A and B recommendations reviewed with patient; age-appropriate recommendations, preventive care, screening tests, etc discussed and encouraged; healthy living encouraged; see AVS for patient education given to patient  . Discussed importance of 150 minutes of physical activity weekly, AHA exercise recommendations given to pt in AVS/handout  . Discussed importance of healthy diet:  eating lean meats and proteins, avoiding trans fats and saturated fats, avoid simple sugars and excessive carbs in diet, eat 6 servings of fruit/vegetables daily and drink plenty of water and avoid sweet beverages.    . Recommended pt to do  annual eye exam and routine dental exams/cleanings  . Depression, alcohol, fall screening completed as documented above and per flowsheets  . Reviewed Health Maintenance: Health Maintenance  Topic Date Due  . Hepatitis C Screening  Never done  . HIV Screening  Never done  . PNA vac Low Risk Adult (1 of 2 - PCV13) Never done  . DEXA SCAN  12/06/2019  . INFLUENZA VACCINE  07/08/2020 (Originally 11/09/2019)  . MAMMOGRAM  12/18/2020  . TETANUS/TDAP  04/10/2021  . COLONOSCOPY  11/26/2028  . COVID-19 Vaccine  Completed    . Immunizations: Immunization History  Administered Date(s) Administered  . Influenza-Unspecified 02/09/2015  . Moderna SARS-COVID-2 Vaccination 07/05/2019, 08/02/2019  . Tdap 04/11/2011  . Zoster Recombinat (Shingrix) 05/16/2019, 09/29/2019        ICD-10-CM   1. Annual physical exam  Z00.00 Hepatitis C Antibody    HIV antibody (with reflex)    Lipid panel    CBC w/Diff/Platelet    Comprehensive metabolic panel  2. Screening for HIV without presence of risk factors  Z11.4 HIV antibody (with reflex)  3. Encounter for hepatitis C screening test for low risk patient  Z11.59 Hepatitis C Antibody  4. Estrogen deficiency  E28.39   5. Postmenopausal estrogen deficiency  Z78.0   6. Need for vaccination with 13-polyvalent pneumococcal conjugate vaccine  Z23 Pneumococcal conjugate vaccine 13-valent IM  7. Abnormal mammogram  of right breast  R92.8 MM Digital Diagnostic Bilat    US BREAST COMPLETE UNI RIGHT INC AXILLA   Mild asymmetry noted on exam today but no concerning findings no lymphadenopathy or no masses or breast lumps  8. Vitamin D deficiency  E55.9 VITAMIN D 25 Hydroxy (Vit-D Deficiency, Fractures)  9. Current mild episode of major depressive disorder, unspecified whether recurrent (HCC)  F32.0 buPROPion (WELLBUTRIN XL) 150 MG 24 hr tablet   PHQ positive she has been taking a variable dose of Wellbutrin?  Sent in her refill and encouraged her to take a consistent  dose and follow-up  10. Class 1 obesity with serious comorbidity and body mass index (BMI) of 32.0 to 32.9 in adult, unspecified obesity type  E66.9    Z68.32    With elevated cholesterol encourage calorie reduction increase activity offered resources and referrals today     Olivia BerryLeisa Tahnee Cifuentes, Cordelia Poche-C 12/29/19 10:49 AM  [facility]

## 2019-12-29 ENCOUNTER — Ambulatory Visit (INDEPENDENT_AMBULATORY_CARE_PROVIDER_SITE_OTHER): Payer: Managed Care, Other (non HMO) | Admitting: Family Medicine

## 2019-12-29 ENCOUNTER — Encounter: Payer: Self-pay | Admitting: Family Medicine

## 2019-12-29 ENCOUNTER — Other Ambulatory Visit: Payer: Self-pay

## 2019-12-29 VITALS — BP 120/82 | HR 76 | Temp 98.0°F | Resp 16 | Ht 64.0 in | Wt 186.8 lb

## 2019-12-29 DIAGNOSIS — Z114 Encounter for screening for human immunodeficiency virus [HIV]: Secondary | ICD-10-CM | POA: Diagnosis not present

## 2019-12-29 DIAGNOSIS — F320 Major depressive disorder, single episode, mild: Secondary | ICD-10-CM

## 2019-12-29 DIAGNOSIS — E559 Vitamin D deficiency, unspecified: Secondary | ICD-10-CM

## 2019-12-29 DIAGNOSIS — Z6832 Body mass index (BMI) 32.0-32.9, adult: Secondary | ICD-10-CM

## 2019-12-29 DIAGNOSIS — Z1159 Encounter for screening for other viral diseases: Secondary | ICD-10-CM | POA: Diagnosis not present

## 2019-12-29 DIAGNOSIS — E2839 Other primary ovarian failure: Secondary | ICD-10-CM | POA: Diagnosis not present

## 2019-12-29 DIAGNOSIS — Z23 Encounter for immunization: Secondary | ICD-10-CM

## 2019-12-29 DIAGNOSIS — Z0000 Encounter for general adult medical examination without abnormal findings: Secondary | ICD-10-CM | POA: Diagnosis not present

## 2019-12-29 DIAGNOSIS — R928 Other abnormal and inconclusive findings on diagnostic imaging of breast: Secondary | ICD-10-CM

## 2019-12-29 DIAGNOSIS — E669 Obesity, unspecified: Secondary | ICD-10-CM

## 2019-12-29 DIAGNOSIS — Z780 Asymptomatic menopausal state: Secondary | ICD-10-CM

## 2019-12-29 MED ORDER — BUPROPION HCL ER (XL) 150 MG PO TB24: 450.0000 mg | ORAL_TABLET | Freq: Every day | ORAL | 3 refills | Status: DC

## 2019-12-29 NOTE — Patient Instructions (Addendum)
Health Maintenance  Topic Date Due  .  Hepatitis C: One time screening is recommended by Center for Disease Control  (CDC) for  adults born from [birth year] through 1966.   Never done  . HIV Screening  Never done  . Pneumonia vaccines (1 of 2 - PCV13) Never done  . DEXA scan (bone density measurement)  12/06/2019  . Flu Shot  07/08/2020*  . Mammogram  12/18/2020  . Tetanus Vaccine  04/10/2021  . Colon Cancer Screening  11/26/2028  . COVID-19 Vaccine  Completed  *Topic was postponed. The date shown is not the original due date.      Preventive Care 23 Years and Older, Female Preventive care refers to lifestyle choices and visits with your health care provider that can promote health and wellness. This includes:  A yearly physical exam. This is also called an annual well check.  Regular dental and eye exams.  Immunizations.  Screening for certain conditions.  Healthy lifestyle choices, such as diet and exercise. What can I expect for my preventive care visit? Physical exam Your health care provider will check:  Height and weight. These may be used to calculate body mass index (BMI), which is a measurement that tells if you are at a healthy weight.  Heart rate and blood pressure.  Your skin for abnormal spots. Counseling Your health care provider may ask you questions about:  Alcohol, tobacco, and drug use.  Emotional well-being.  Home and relationship well-being.  Sexual activity.  Eating habits.  History of falls.  Memory and ability to understand (cognition).  Work and work Statistician.  Pregnancy and menstrual history. What immunizations do I need?  Influenza (flu) vaccine  This is recommended every year. Tetanus, diphtheria, and pertussis (Tdap) vaccine  You may need a Td booster every 10 years. Varicella (chickenpox) vaccine  You may need this vaccine if you have not already been vaccinated. Zoster (shingles) vaccine  You may need this after age  4. Pneumococcal conjugate (PCV13) vaccine  One dose is recommended after age 48. Pneumococcal polysaccharide (PPSV23) vaccine  One dose is recommended after age 63. Measles, mumps, and rubella (MMR) vaccine  You may need at least one dose of MMR if you were born in 1957 or later. You may also need a second dose. Meningococcal conjugate (MenACWY) vaccine  You may need this if you have certain conditions. Hepatitis A vaccine  You may need this if you have certain conditions or if you travel or work in places where you may be exposed to hepatitis A. Hepatitis B vaccine  You may need this if you have certain conditions or if you travel or work in places where you may be exposed to hepatitis B. Haemophilus influenzae type b (Hib) vaccine  You may need this if you have certain conditions. You may receive vaccines as individual doses or as more than one vaccine together in one shot (combination vaccines). Talk with your health care provider about the risks and benefits of combination vaccines. What tests do I need? Blood tests  Lipid and cholesterol levels. These may be checked every 5 years, or more frequently depending on your overall health.  Hepatitis C test.  Hepatitis B test. Screening  Lung cancer screening. You may have this screening every year starting at age 20 if you have a 30-pack-year history of smoking and currently smoke or have quit within the past 15 years.  Colorectal cancer screening. All adults should have this screening starting at age 52 and  continuing until age 19. Your health care provider may recommend screening at age 34 if you are at increased risk. You will have tests every 1-10 years, depending on your results and the type of screening test.  Diabetes screening. This is done by checking your blood sugar (glucose) after you have not eaten for a while (fasting). You may have this done every 1-3 years.  Mammogram. This may be done every 1-2 years. Talk with  your health care provider about how often you should have regular mammograms.  BRCA-related cancer screening. This may be done if you have a family history of breast, ovarian, tubal, or peritoneal cancers. Other tests  Sexually transmitted disease (STD) testing.  Bone density scan. This is done to screen for osteoporosis. You may have this done starting at age 59. Follow these instructions at home: Eating and drinking  Eat a diet that includes fresh fruits and vegetables, whole grains, lean protein, and low-fat dairy products. Limit your intake of foods with high amounts of sugar, saturated fats, and salt.  Take vitamin and mineral supplements as recommended by your health care provider.  Do not drink alcohol if your health care provider tells you not to drink.  If you drink alcohol: ? Limit how much you have to 0-1 drink a day. ? Be aware of how much alcohol is in your drink. In the [place] one drink equals one 12 oz bottle of beer (355 mL), one 5 oz glass of wine (148 mL), or one 1 oz glass of hard liquor (44 mL). Lifestyle  Take daily care of your teeth and gums.  Stay active. Exercise for at least 30 minutes on 5 or more days each week.  Do not use any products that contain nicotine or tobacco, such as cigarettes, e-cigarettes, and chewing tobacco. If you need help quitting, ask your health care provider.  If you are sexually active, practice safe sex. Use a condom or other form of protection in order to prevent STIs (sexually transmitted infections).  Talk with your health care provider about taking a low-dose aspirin or statin. What's next?  Go to your health care provider once a year for a well check visit.  Ask your health care provider how often you should have your eyes and teeth checked.  Stay up to date on all vaccines. This information is not intended to replace advice given to you by your health care provider. Make sure you discuss any questions you have with your  health care provider. Document Revised: 03/21/2018 Document Reviewed: 03/21/2018 Elsevier Patient Education  2020 [place].     Preventing Osteoporosis, Adult Osteoporosis is a condition that causes the bones to lose density. This means that the bones become thinner, and the normal spaces in bone tissue become larger. Low bone density can make the bones weak and cause them to break more easily. Osteoporosis cannot always be prevented, but you can take steps to lower your risk of developing this condition. How can this condition affect me? If you develop osteoporosis, you will be more likely to break bones in your wrist, spine, or hip. Even a minor accident or injury can be enough to break weak bones. The bones will also be slower to heal. Osteoporosis can cause other problems as well, such as a stooped posture or trouble with movement. Osteoporosis can occur with aging. As you get older, you may lose bone tissue more quickly, or it may be replaced more slowly. Osteoporosis is more likely to develop  if you have poor nutrition or do not get enough calcium or vitamin D. Other lifestyle factors can also play a role. By eating a well-balanced diet and making lifestyle changes, you can help keep your bones strong and healthy, lowering your chances of developing osteoporosis. What can increase my risk? The following factors may make you more likely to develop osteoporosis:  Having a family history of the condition.  Having poor nutrition or not getting enough calcium or vitamin D.  Using certain medicines, such as steroid medicines or antiseizure medicines.  Being any of the following: ? 35 years of age or older. ? Female. ? A woman who has gone through menopause (is postmenopausal). ? White (Caucasian) or of Asian descent.  Smoking or having a history of smoking.  Not being physically active (being sedentary).  Having a small body frame. What actions can I take to prevent this?  Get  enough calcium   Make sure you get enough calcium every day. Calcium is the most important mineral for bone health. Most people can get enough calcium from their diet, but supplements may be recommended for people who are at risk for osteoporosis. Follow these guidelines: ? If you are age 32 or younger, aim to get 1,000 mg of calcium every day. ? If you are older than age 53, aim to get 1,200 mg of calcium every day.  Good sources of calcium include: ? Dairy products, such as low-fat or nonfat milk, cheese, and yogurt. ? Dark green leafy vegetables, such as bok choy and broccoli. ? Foods that have had calcium added to them (calcium-fortified foods), such as orange juice, cereal, bread, soy beverages, and tofu products. ? Nuts, such as almonds.  Check nutrition labels to see how much calcium is in a food or drink. Get enough vitamin D  Try to get enough vitamin D every day. Vitamin D is the most essential vitamin for bone health. It helps the body absorb calcium. Follow these guidelines for how much vitamin D to get from food: ? If you are age 61 or younger, aim to get at least 600 international units (IU) every day. Your health care provider may suggest more. ? If you are older than age 27, aim to get at least 800 international units every day. Your health care provider may suggest more.  Good sources of vitamin D in your diet include: ? Egg yolks. ? Oily fish, such as salmon, sardines, and tuna. ? Milk and cereal fortified with vitamin D.  Your body also makes vitamin D when you are out in the sun. Exposing the bare skin on your face, arms, legs, or back to the sun for no more than 30 minutes a day, 2 times a week is more than enough. Beyond that, make sure you use sunblock to protect your skin from sunburn, which increases your risk for skin cancer. Exercise  Stay active and get exercise every day.  Ask your health care provider what types of exercise are best for you. Weight-bearing  and strength-building activities are important for building and maintaining healthy bones. Some examples of these types of activities include: ? Walking and hiking. ? Jogging and running. ? Dancing. ? Gym exercises. ? Lifting weights. ? Tennis and racquetball. ? Climbing stairs. ? Aerobics. Make other lifestyle changes  Do not use any products that contain nicotine or tobacco, such as cigarettes, e-cigarettes, and chewing tobacco. If you need help quitting, ask your health care provider.  Lose weight if you  are overweight.  If you drink alcohol: ? Limit how much you use to:  0-1 drink a day for nonpregnant women.  0-2 drinks a day for men. ? Be aware of how much alcohol is in your drink. In the [place]., one drink equals one 12 oz bottle of beer (355 mL), one 5 oz glass of wine (148 mL), or one 1 oz glass of hard liquor (44 mL). Where to find support If you need help making changes to prevent osteoporosis, talk with your health care provider. You can ask for a referral to a diet and nutrition specialist (dietitian) and a physical therapist. Where to find more information Learn more about osteoporosis from:  NIH Osteoporosis and Related [place]: www.bones.SouthExposed.es  [place]. Office on [employer] Health: [place]s.tn  [place]: EquipmentWeekly.com.ee Summary  Osteoporosis is a condition that causes weak bones that are more likely to break.  Eat a healthy diet, making sure you get enough calcium and vitamin D, and stay active by getting regular exercise to help prevent osteoporosis.  Other ways to reduce your risk of osteoporosis include maintaining a healthy weight and avoiding alcohol and products that contain nicotine or tobacco. This information is not intended to replace advice given to you by your health care provider. Make sure you discuss any questions you have with your health care provider. Document Revised: 10/25/2018 Document  Reviewed: 10/25/2018 Elsevier Patient Education  [place].

## 2019-12-31 ENCOUNTER — Other Ambulatory Visit: Payer: Self-pay

## 2019-12-31 ENCOUNTER — Ambulatory Visit
Admission: RE | Admit: 2019-12-31 | Discharge: 2019-12-31 | Disposition: A | Discharge status: Home / Self Care | Payer: Managed Care, Other (non HMO) | Source: Ambulatory Visit | Attending: Family Medicine | Admitting: Family Medicine

## 2019-12-31 DIAGNOSIS — R928 Other abnormal and inconclusive findings on diagnostic imaging of breast: Secondary | ICD-10-CM

## 2020-01-10 LAB — CBC WITH DIFFERENTIAL/PLATELET
Basophils Absolute: 0 10*3/uL (ref 0.0–0.2)
Basos: 1 %
EOS (ABSOLUTE): 0.1 10*3/uL (ref 0.0–0.4)
Eos: 3 %
Hematocrit: 46.2 % (ref 34.0–46.6)
Hemoglobin: 15.3 g/dL (ref 11.1–15.9)
Immature Grans (Abs): 0 10*3/uL (ref 0.0–0.1)
Immature Granulocytes: 0 %
Lymphocytes Absolute: 1.1 10*3/uL (ref 0.7–3.1)
Lymphs: 22 %
MCH: 31.4 pg (ref 26.6–33.0)
MCHC: 33.1 g/dL (ref 31.5–35.7)
MCV: 95 fL (ref 79–97)
Monocytes Absolute: 0.4 10*3/uL (ref 0.1–0.9)
Monocytes: 8 %
Neutrophils Absolute: 3.1 10*3/uL (ref 1.4–7.0)
Neutrophils: 66 %
Platelets: 151 10*3/uL (ref 150–450)
RBC: 4.87 x10E6/uL (ref 3.77–5.28)
RDW: 12.9 % (ref 11.7–15.4)
WBC: 4.7 10*3/uL (ref 3.4–10.8)

## 2020-01-10 LAB — COMPREHENSIVE METABOLIC PANEL
ALT: 17 IU/L (ref 0–32)
AST: 14 IU/L (ref 0–40)
Albumin/Globulin Ratio: 2.1 (ref 1.2–2.2)
Albumin: 4.6 g/dL (ref 3.8–4.8)
Alkaline Phosphatase: 77 IU/L (ref 44–121)
BUN/Creatinine Ratio: 15 (ref 12–28)
BUN: 15 mg/dL (ref 8–27)
Bilirubin Total: 0.4 mg/dL (ref 0.0–1.2)
CO2: 25 mmol/L (ref 20–29)
Calcium: 9.7 mg/dL (ref 8.7–10.3)
Chloride: 99 mmol/L (ref 96–106)
Creatinine, Ser: 1.03 mg/dL — ABNORMAL HIGH (ref 0.57–1.00)
GFR calc Af Amer: 66 mL/min/{1.73_m2} (ref >59)
GFR calc non Af Amer: 57 mL/min/{1.73_m2} — ABNORMAL LOW (ref >59)
Globulin, Total: 2.2 g/dL (ref 1.5–4.5)
Glucose: 95 mg/dL (ref 65–99)
Potassium: 4 mmol/L (ref 3.5–5.2)
Sodium: 138 mmol/L (ref 134–144)
Total Protein: 6.8 g/dL (ref 6.0–8.5)

## 2020-01-10 LAB — VITAMIN D 25 HYDROXY (VIT D DEFICIENCY, FRACTURES): Vit D, 25-Hydroxy: 34.1 ng/mL (ref 30.0–100.0)

## 2020-01-10 LAB — LIPID PANEL
Chol/HDL Ratio: 2.6 ratio (ref 0.0–4.4)
Cholesterol, Total: 187 mg/dL (ref 100–199)
HDL: 72 mg/dL (ref >39)
LDL Chol Calc (NIH): 98 mg/dL (ref 0–99)
Triglycerides: 95 mg/dL (ref 0–149)
VLDL Cholesterol Cal: 17 mg/dL (ref 5–40)

## 2020-01-10 LAB — HEPATITIS C ANTIBODY: Hep C Virus Ab: <0.1 s/co ratio (ref 0.0–0.9)

## 2020-01-10 LAB — HIV ANTIBODY (ROUTINE TESTING W REFLEX): HIV Screen 4th Generation wRfx: NONREACTIVE

## 2020-04-15 ENCOUNTER — Other Ambulatory Visit: Payer: Managed Care, Other (non HMO)

## 2020-04-15 DIAGNOSIS — Z20822 Contact with and (suspected) exposure to covid-19: Secondary | ICD-10-CM

## 2020-04-17 LAB — NOVEL CORONAVIRUS, NAA: SARS-CoV-2, NAA: NOT DETECTED

## 2020-04-17 LAB — SARS-COV-2, NAA 2 DAY TAT

## 2020-04-28 ENCOUNTER — Ambulatory Visit: Payer: Self-pay

## 2020-04-28 NOTE — Telephone Encounter (Signed)
Patient called stating that she has pain to left shoulder that travels to her elbow.  She states it started sometime between Christmas and  The new new year. The pain is made worse by movement of the shoulder. She was out shoveling yesterday and states that it bothered her all night. She rates pain at 6-7 at worst.  She denies chest pain jaw pain. No SOB no swelling in the joint. No redness. No known hx of injury. Per protocol [facility]  She was told she may need xray other things but she does not like UC.   Reason for Disposition . [1] MODERATE pain (e.g., interferes with normal activities) AND [2] present > 3 days  Answer Assessment - Initial Assessment Questions 1. ONSET: "When did the pain start?"     Between christmas and new year 2. LOCATION: "Where is the pain located?"     Left should travels left arm to elbow 3. PAIN: "How bad is the pain?" (Scale 1-10; or mild, moderate, severe)   - MILD (1-3): doesn't interfere with normal activities   - MODERATE (4-7): interferes with normal activities (e.g., work or school) or awakens from sleep   - SEVERE (8-10): excruciating pain, unable to do any normal activities, unable to move arm at all due to pain     6-7 at worst 4. WORK OR EXERCISE: "Has there been any recent work or exercise that involved this part of the body?"     Recently have shoveled 5. CAUSE: "What do you think is causing the shoulder pain?"     Unsure have not injured it  6. OTHER SYMPTOMS: "Do you have any other symptoms?" (e.g., neck pain, swelling, rash, fever, numbness, weakness)     none 7. PREGNANCY: "Is there any chance you are pregnant?" "When was your last menstrual period?"     N/A  Protocols used: SHOULDER PAIN-A-AH

## 2020-05-05 ENCOUNTER — Other Ambulatory Visit: Payer: Self-pay | Admitting: Family Medicine

## 2020-06-08 ENCOUNTER — Other Ambulatory Visit: Payer: Self-pay

## 2020-06-08 ENCOUNTER — Encounter: Payer: Self-pay | Admitting: Family Medicine

## 2020-06-08 ENCOUNTER — Ambulatory Visit (INDEPENDENT_AMBULATORY_CARE_PROVIDER_SITE_OTHER): Payer: Medicare Other | Admitting: Family Medicine

## 2020-06-08 VITALS — BP 110/80 | HR 74 | Temp 97.6°F | Resp 16 | Ht 64.0 in | Wt 195.3 lb

## 2020-06-08 DIAGNOSIS — F320 Major depressive disorder, single episode, mild: Secondary | ICD-10-CM | POA: Diagnosis not present

## 2020-06-08 DIAGNOSIS — M25512 Pain in left shoulder: Secondary | ICD-10-CM | POA: Insufficient documentation

## 2020-06-08 DIAGNOSIS — E669 Obesity, unspecified: Secondary | ICD-10-CM | POA: Diagnosis not present

## 2020-06-08 DIAGNOSIS — E6609 Other obesity due to excess calories: Secondary | ICD-10-CM | POA: Diagnosis not present

## 2020-06-08 DIAGNOSIS — Z6830 Body mass index (BMI) 30.0-30.9, adult: Secondary | ICD-10-CM

## 2020-06-08 HISTORY — DX: Pain in left shoulder: M25.512

## 2020-06-08 MED ORDER — VALACYCLOVIR HCL 500 MG PO TABS
500.0000 mg | ORAL_TABLET | Freq: Every day | ORAL | 3 refills | Status: DC
Start: 2020-06-08 — End: 2021-02-02

## 2020-06-08 MED ORDER — BUPROPION HCL ER (XL) 150 MG PO TB24: 300.0000 mg | ORAL_TABLET | Freq: Every day | ORAL | 3 refills | Status: DC

## 2020-06-08 MED ORDER — RABEPRAZOLE SODIUM 20 MG PO TBEC
DELAYED_RELEASE_TABLET | ORAL | 3 refills | Status: DC
Start: 2020-06-08 — End: 2020-11-25

## 2020-06-08 NOTE — Patient Instructions (Signed)
It was great to see you!  Our plans for today:  - We are getting an xray of your shoulder and will release these results to your MyChart.  - You can continue to take meloxicam for pain relief and continue activity as tolerated, avoiding activity that exacerbates your pain.  - Let us know if you don't hear about an appointment with the nutritionist in the next few weeks. See below for tips on healthy eating and exercise. - Make an appointment with [facility] for followup.   Take care and seek immediate care sooner if you develop any concerns.   Dr. Ky Barban  Here is an example of what a healthy plate looks like:    ? Make half your plate fruits and vegetables.     ? Focus on whole fruits.     ? Vary your veggies.  ? Make half your grains whole grains. -     ? Look for the word "whole" at the beginning of the ingredients list    ? Some whole-grain ingredients include whole oats, whole-wheat flour,        whole-grain corn, whole-grain brown rice, and whole rye.  ? Move to low-fat and fat-free milk or yogurt.  ? Vary your protein routine. - Meat, fish, poultry (chicken, [place]), eggs, beans (kidney, pinto), dairy.  ? Drink and eat less sodium, saturated fat, and added sugars.  Look for opportunities to move your body throughout your day:  Never lie down when you can sit; never sit when you can stand; never stand when you can pace.  Moving your body throughout the day is just as important as the 30 or 60 minutes of exercise at the gym!  Get social Get active with your friends instead of going out to eat. Go for a hike, walk around the mall, or play an exercise-themed video game.   Move more at work Fit more activity into the workday. Stand during phone calls, use a printer farther from your desk, and get up to stretch each hour.    Do something new Develop a new skill to kick-start your motivation. Sign up for a class to learn how to [employer], surf, do tai chi, or play a sport.     Keep cool in the pool Don't like to sweat? Hit the local community pool for a swim, water polo, or water aerobics class to stay cool while exercising.    Stay on track Use a fitness tracker (FITBIT, Fitness Pal mobile app) to track your activity and provide motivation to reach your goals.

## 2020-06-08 NOTE — Assessment & Plan Note (Signed)
Referral to nutrition placed per patient request. Recommend continued efforts with diet and exercise to achieve weight loss. F/u with PCP.

## 2020-06-08 NOTE — Progress Notes (Signed)
    SUBJECTIVE:   CHIEF COMPLAINT / HPI:   ARM PAIN - feels like it pops with movement.  Duration: months Location: left upper arm Mechanism of injury: unknown Onset: gradual Quality:  Ill-defined Frequency: intermittent Radiation: yes, starts in shoulder and goes down upper arm. Does not go past elbow. Aggravating factors: lifting and movement  Alleviating factors: rest  Status: stable Treatments attempted: meloxicam, flexeril, ibuprofen, heat and APAP  Relief with NSAIDs?:  no Swelling: no Redness: no  Warmth: no Trauma: no Chest pain: no  Shortness of breath: no  Fever: no Decreased sensation: no Paresthesias: no Weakness: no  R handed.  Obesity - previously discussed nutrition referral but had declined, is now amenable. Planning to schedule upcoming appt with PCP for followup.   OBJECTIVE:   BP 110/80   Pulse 74   Temp 97.6 F (36.4 C) (Oral)   Resp 16   Ht 5\' 4"  (1.626 m)   Wt 195 lb 4.8 oz (88.6 kg)   SpO2 98%   BMI 33.52 kg/m   Gen: well appearing, in NAD MSK: Inspection: no asymmetry, rashes, lesions Palpation: nonTTP over AC joint, bicep muscle bely ROM: full AROM Strength: 5/5 UE strength with flexion, extension. Stability: no joint laxity Special Tests: +Hawkins. Negative Speeds, Yergasons, Apley scratch.  Neurovascular: intact   ASSESSMENT/PLAN:   Acute pain of left shoulder Likely 2/2 impingement at [facility] joint given exam findings and location of pain, also probable OA contributing given extensive OA in other joints. No findings to concern for infection, gout, rheumatic process. Will obtain shoulder XR to evaluate further. Continue to use meloxicam for pain relief, activity as tolerated. Consider steroid injection pending XR results.   Obesity (BMI 30.0-34.9) Referral to nutrition placed per patient request. Recommend continued efforts with diet and exercise to achieve weight loss. F/u with PCP.    08-12-1992, DO

## 2020-06-08 NOTE — Assessment & Plan Note (Signed)
Likely 2/2 impingement at [facility] joint given exam findings and location of pain, also probable OA contributing given extensive OA in other joints. No findings to concern for infection, gout, rheumatic process. Will obtain shoulder XR to evaluate further. Continue to use meloxicam for pain relief, activity as tolerated. Consider steroid injection pending XR results.

## 2020-06-17 ENCOUNTER — Other Ambulatory Visit: Payer: Self-pay

## 2020-06-17 ENCOUNTER — Ambulatory Visit
Admission: RE | Admit: 2020-06-17 | Discharge: 2020-06-17 | Disposition: A | Discharge status: Home / Self Care | Payer: Medicare Other | Source: Ambulatory Visit | Attending: Family Medicine | Admitting: Family Medicine

## 2020-06-17 DIAGNOSIS — M25512 Pain in left shoulder: Secondary | ICD-10-CM | POA: Diagnosis not present

## 2020-06-21 ENCOUNTER — Encounter: Payer: Self-pay | Admitting: Family Medicine

## 2020-06-21 ENCOUNTER — Other Ambulatory Visit: Payer: Self-pay

## 2020-06-21 ENCOUNTER — Ambulatory Visit (INDEPENDENT_AMBULATORY_CARE_PROVIDER_SITE_OTHER): Payer: Medicare Other | Admitting: Family Medicine

## 2020-06-21 VITALS — BP 118/64 | HR 98 | Temp 97.8°F | Resp 16 | Wt 196.1 lb

## 2020-06-21 DIAGNOSIS — M25512 Pain in left shoulder: Secondary | ICD-10-CM

## 2020-06-21 MED ORDER — TIZANIDINE HCL 2 MG PO TABS: 2.0000 mg | ORAL_TABLET | Freq: Every day | ORAL | 0 refills | Status: DC | PRN

## 2020-06-21 NOTE — Progress Notes (Signed)
    SUBJECTIVE:   CHIEF COMPLAINT / HPI:   ARM PAIN - here for followup. Arm pain still the same. - XR shoulder negative. - couldn't tell relief from meloxicam.  - took flexeril sparingly at night, maybe helped a little. - pain hasn't changed.   Duration: months Location: left upper arm Mechanism of injury: unknown Onset: gradual Quality:  Ill-defined, feels like it pops with movement Frequency: intermittent Radiation: starts in shoulder and goes down arm, does not go past elbow Aggravating factors:lifting, movement  Alleviating factors: rest, holding area with opposite arm Status: stable Treatments attempted: rest, heat, APAP and ibuprofen  Relief with NSAIDs?:  no Swelling: no Redness: no  Warmth: no Trauma: no Chest pain: no  Shortness of breath: no  Fever: no Decreased sensation: no Paresthesias: no Weakness: no    OBJECTIVE:   BP 118/64   Pulse 98   Temp 97.8 F (36.6 C)   Resp 16   Wt 196 lb 1.6 oz (89 kg)   SpO2 98%   BMI 33.66 kg/m   Gen: well appearing, in NAD Card: Reg rate Lungs: Comfortable WOB on RA Ext: WWP, no edema MSK: full AROM. See previous note for full exam.   ASSESSMENT/PLAN:   Acute pain of left shoulder Seen today for follow up, no better with meloxicam. XR nondiagnostic. Will provide short refill of tizanidine given minor relief. Will refer to sports medicine for further evaluation and likely dynamic ultrasound.    Caro Laroche, DO

## 2020-06-21 NOTE — Assessment & Plan Note (Signed)
Seen today for follow up, no better with meloxicam. XR nondiagnostic. Will provide short refill of tizanidine given minor relief. Will refer to sports medicine for further evaluation and likely dynamic ultrasound.

## 2020-06-21 NOTE — Patient Instructions (Signed)
It was great to see you!  Our plans for today:  - We are referring you to Sports Medicine for your arm pain. Let us know if you don't hear about an appointment in the next few weeks.  - I will reach out to our referral coordinator regarding your nutrition referral.  Take care and seek immediate care sooner if you develop any concerns.   Dr. Linwood Dibbles

## 2020-07-10 IMAGING — MG MM DIGITAL SCREENING BILAT W/ TOMO W/ CAD
8 series · 8 of 24 positions shown · non-contrast
Comparison: Previous exam(s).

CLINICAL DATA: Screening.

EXAM:
DIGITAL SCREENING BILATERAL MAMMOGRAM WITH TOMO AND CAD

[R CC synth-2D]
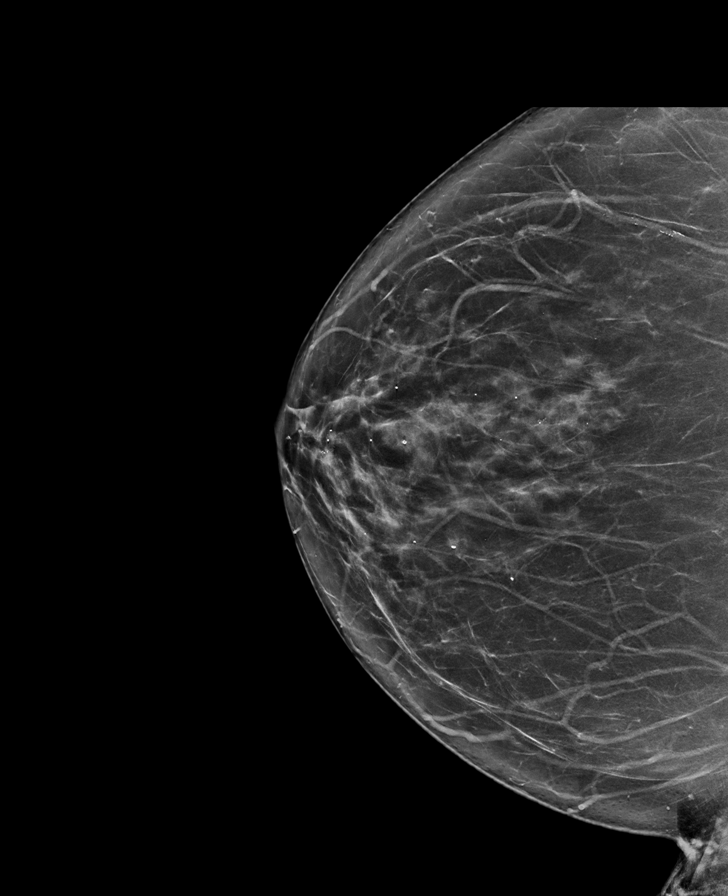

[L MLO synth-2D]
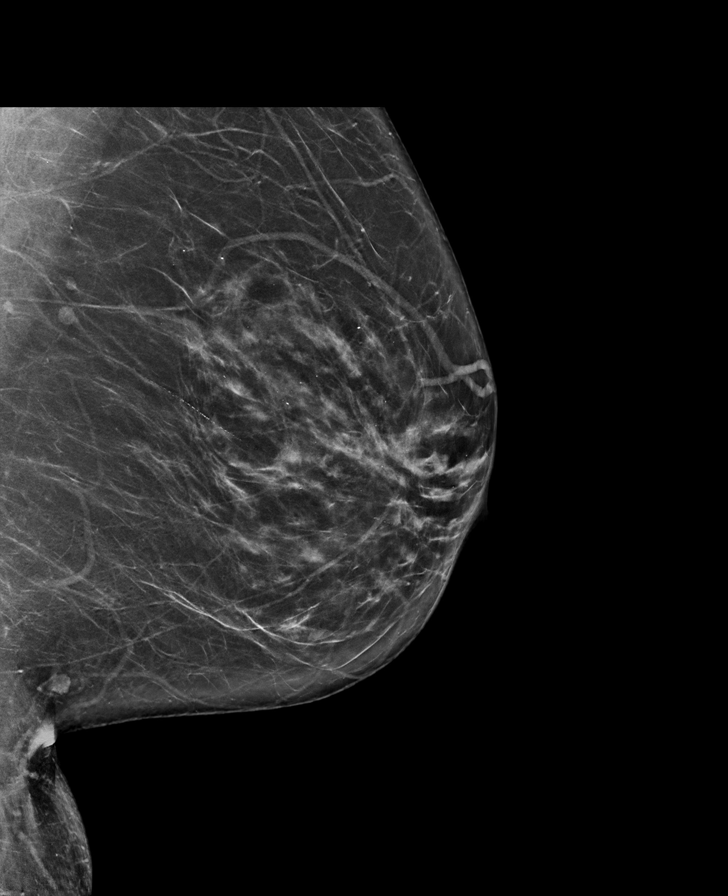

[L CC synth-2D]
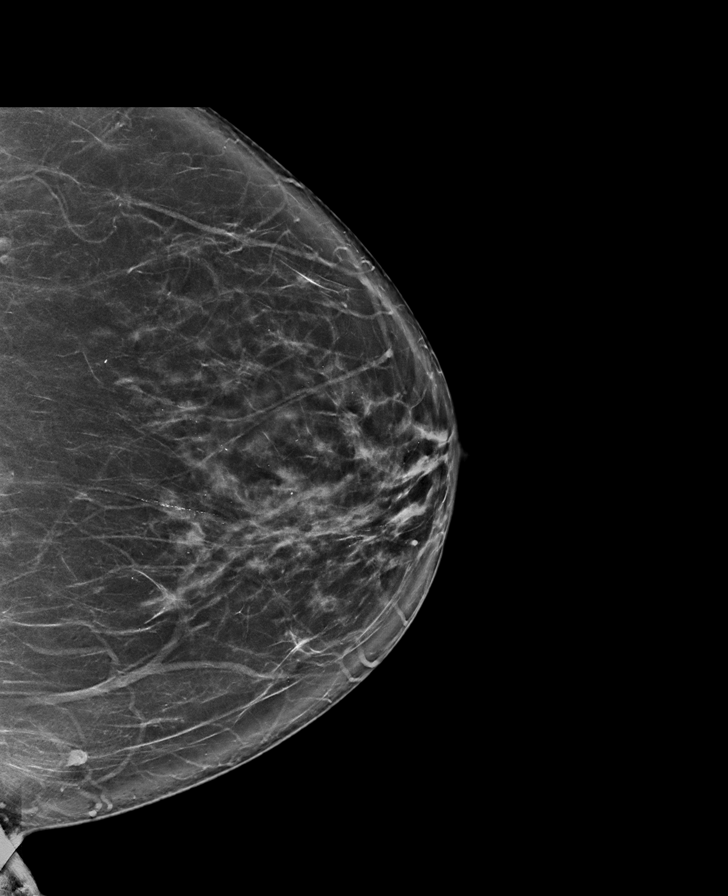

[R MLO synth-2D]
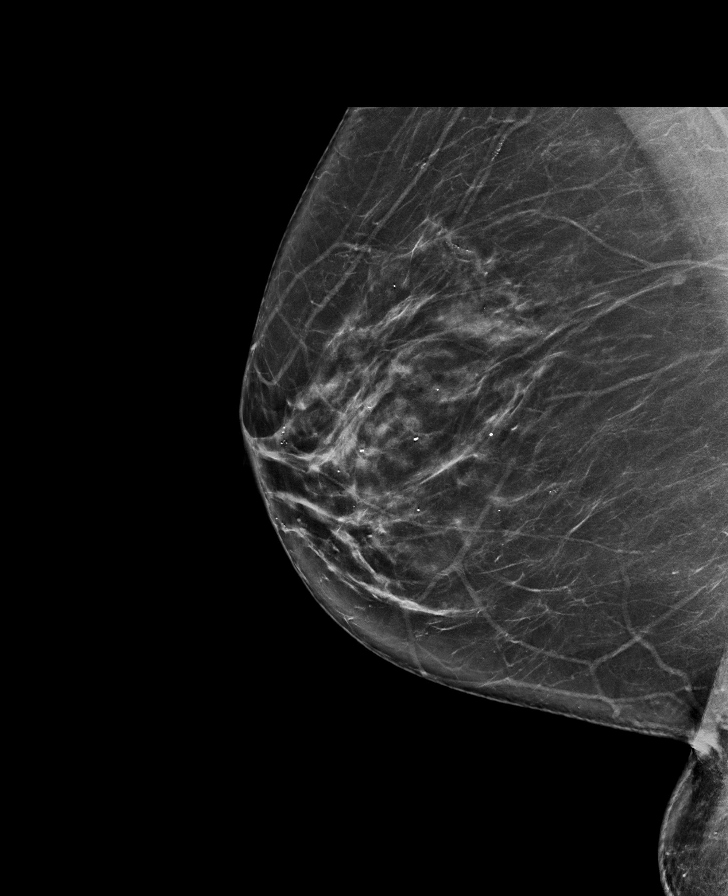

[L MLO tomo · tomo slice 42/83.0]
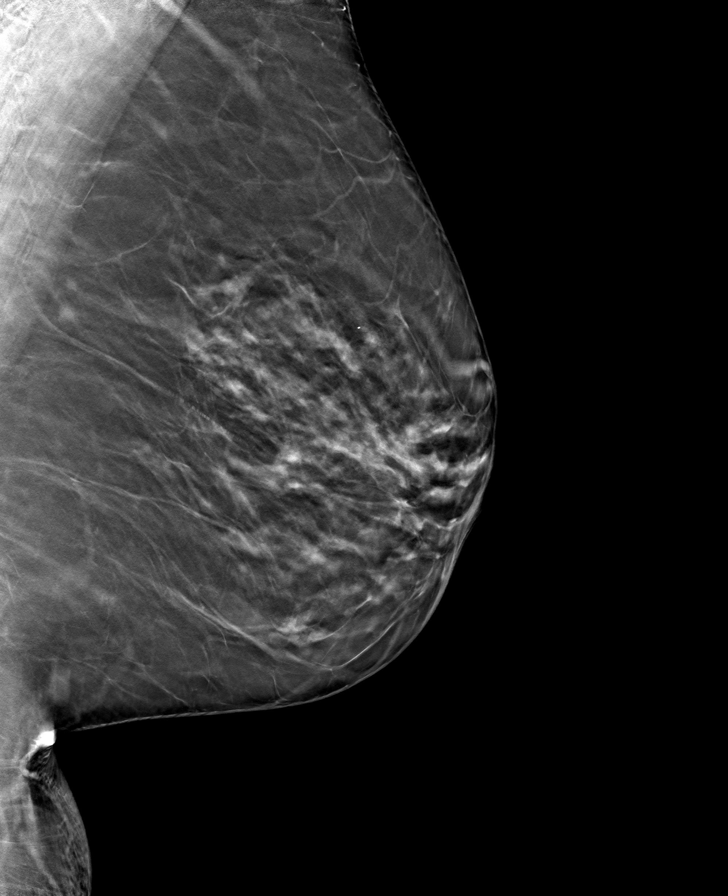

[R MLO tomo · tomo slice 43/85.0]
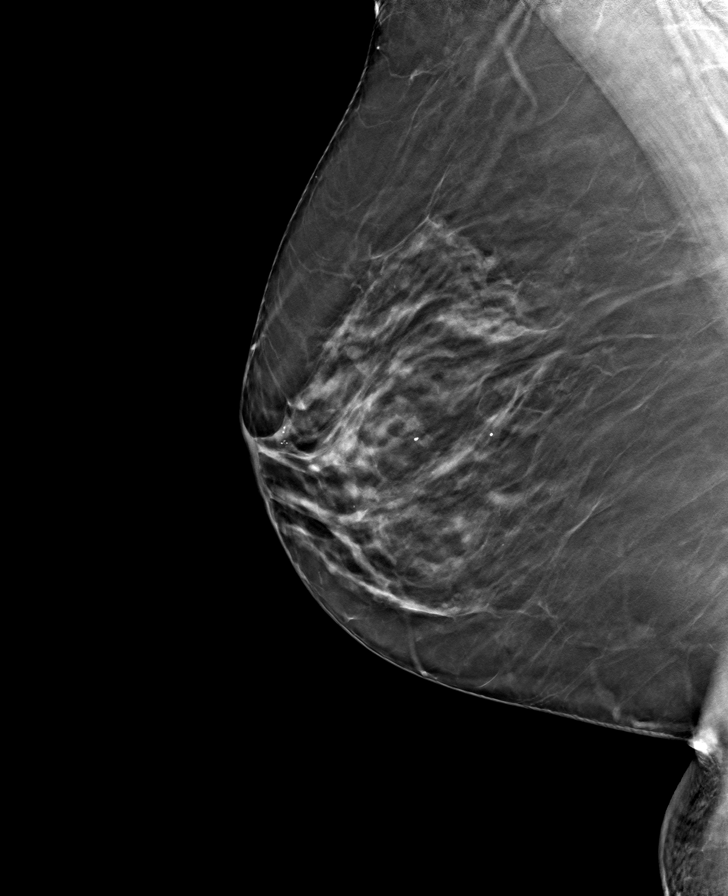

[L CC tomo · tomo slice 42/83.0]
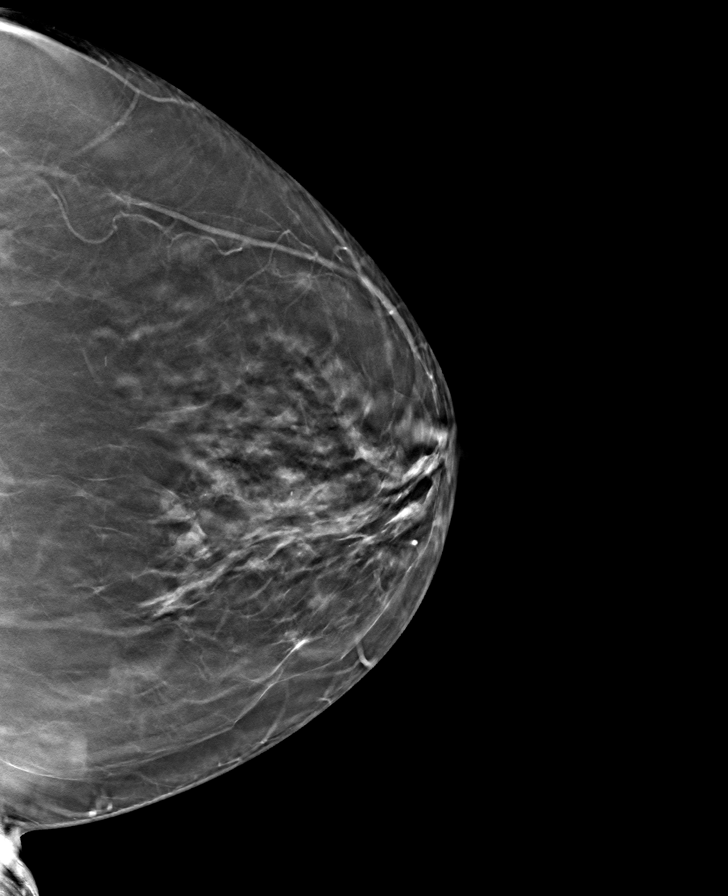

[R CC tomo · tomo slice 43/84.0]
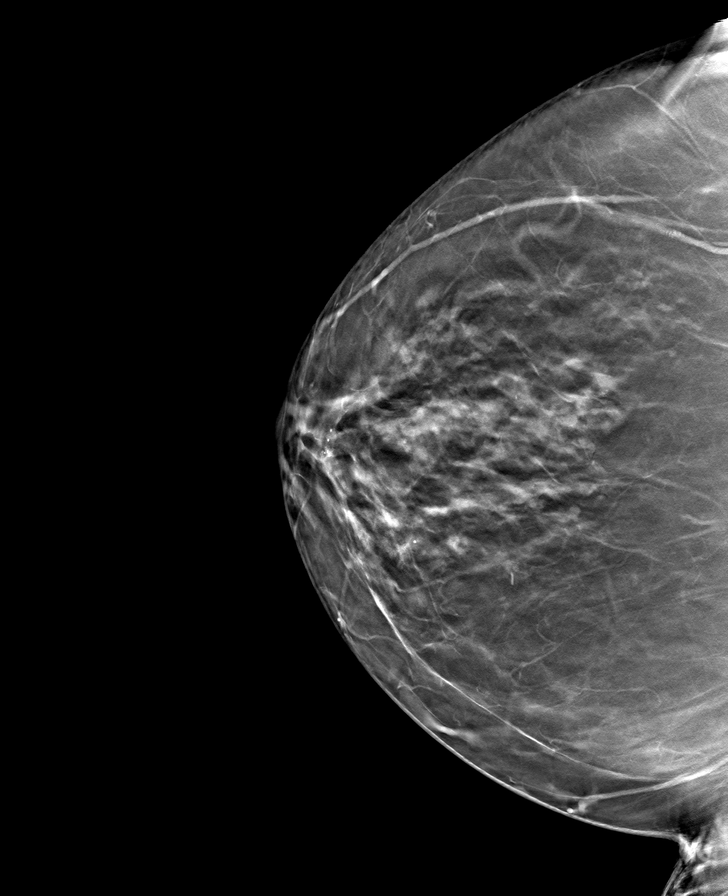

[8 of 24 positions shown; findings below may reference images not displayed]

ACR Breast Density Category b: There are scattered areas of
fibroglandular density.
FINDINGS: There are no findings suspicious for malignancy. Images were
processed with CAD.
IMPRESSION: No mammographic evidence of malignancy. A result letter of this
screening mammogram will be mailed directly to the patient.

RECOMMENDATION:
Screening mammogram in one year. (Code:[id number])

BI-RADS CATEGORY  1: Negative.

## 2020-07-22 ENCOUNTER — Encounter: Payer: Self-pay | Admitting: Family Medicine

## 2020-07-22 ENCOUNTER — Other Ambulatory Visit: Payer: Self-pay

## 2020-07-22 ENCOUNTER — Telehealth (INDEPENDENT_AMBULATORY_CARE_PROVIDER_SITE_OTHER): Payer: Medicare Other | Admitting: Family Medicine

## 2020-07-22 DIAGNOSIS — J3089 Other allergic rhinitis: Secondary | ICD-10-CM | POA: Diagnosis not present

## 2020-07-22 DIAGNOSIS — G43009 Migraine without aura, not intractable, without status migrainosus: Secondary | ICD-10-CM

## 2020-07-22 DIAGNOSIS — R059 Cough, unspecified: Secondary | ICD-10-CM

## 2020-07-22 MED ORDER — GUAIFENESIN-CODEINE 100-10 MG/5ML PO SOLN: 5.0000 mL | Freq: Three times a day (TID) | ORAL | 0 refills | Status: DC | PRN

## 2020-07-22 MED ORDER — RIZATRIPTAN BENZOATE 5 MG PO TABS: 5.0000 mg | ORAL_TABLET | ORAL | 0 refills | Status: DC | PRN

## 2020-07-22 NOTE — Progress Notes (Signed)
Virtual Visit Note  I connected with patient on 07/22/20 at 1120 by telephone due to unable to work Epic video visit and verified that I am speaking with the correct person using two identifiers. Olivia Morgan is currently located at home and no family members are currently with them during visit. The provider, Azalee Course Shemaiah Round, FNP is located in their office at time of visit.  I discussed the limitations, risks, security and privacy concerns of performing an evaluation and management service by telephone and the availability of in person appointments. I also discussed with the patient that there may be a patient responsible charge related to this service. The patient expressed understanding and agreed to proceed.   I provided 20 minutes of non-face-to-face time during this encounter.  Chief Complaint  Patient presents with  . Headache    Fever 102, cough, diarrhea     HPI Started with coughing? Didn't feel well Tuesday Wednesday started feeling exhausted Took a home COVID test and it was negative Last night Tmax 102 Having a hard time sleeping due to cough Any food is worried will make her nauseas and give her motion sickness Having a terrible headache as well Has taken allergy medicine and OTC cough medicine Taking Tylenol for the headaches Headache is in bilateral temples Has had migraines in the past  Allergies  Allergen Reactions  . Levofloxacin Hives  . Morphine Nausea Only  . Celecoxib Other (See Comments)    SOB  . Celexa [Citalopram Hydrobromide]   . Cymbalta [Duloxetine Hcl] Hives  . Gabapentin Other (See Comments)    anger  . Levaquin [Levofloxacin In D5w] Hives  . Valium [Diazepam] Other (See Comments)    hallucinations  . Monistat [Miconazole] Rash  . Prednisone Anxiety    Prior to Admission medications   Medication Sig Start Date End Date Taking? Authorizing Provider  buPROPion (WELLBUTRIN XL) 150 MG 24 hr tablet Take 2 tablets (300 mg total) by  mouth daily. 06/08/20  Yes Caro Laroche, DO  Cholecalciferol (VITAMIN D-3 PO) Take 1,000 Units by mouth once a week.    Yes [provider]  diphenhydrAMINE (BENADRYL) 25 MG tablet Take 25 mg by mouth every 6 (six) hours as needed.   Yes [provider]  meloxicam (MOBIC) 15 MG tablet Take 15 mg by mouth daily. 02/06/19  Yes [provider]  RABEprazole (ACIPHEX) 20 MG tablet TAKE 1 TABLET BY MOUTH  DAILY CAUTION PROLONGED USE MAY INCREASE RISK OF  PNEUMONIA COLITIS  OSTEOPOROSIS ANEMIA 06/08/20  Yes Rumball, Darl Householder, DO  tiZANidine (ZANAFLEX) 2 MG tablet Take 1 tablet (2 mg total) by mouth daily as needed for muscle spasms. 06/21/20  Yes Caro Laroche, DO  triamcinolone (NASACORT) 55 MCG/ACT AERO nasal inhaler Place 2 sprays into the nose daily.   Yes [provider]  valACYclovir (VALTREX) 500 MG tablet Take 1 tablet (500 mg total) by mouth daily. 06/08/20  Yes Caro Laroche, DO  VITAMIN E PO Take by mouth once a week.    Yes [provider]    Past Medical History:  Diagnosis Date  . Bilateral hearing loss    wears hearing aids  . Depression   . Genital herpes   . GERD (gastroesophageal reflux disease)   . Herpes simplex virus infection   . Murmur   . Neuropathy of both feet 06/05/2015  . OA (osteoarthritis) of hip    left  . Osteopenia of lumbar spine 2010  .  Plantar fasciitis   . Stomatitis     Past Surgical History:  Procedure Laterality Date  . CESAREAN SECTION    . COLONOSCOPY  2010  . COLONOSCOPY WITH PROPOFOL N/A 11/27/2018   Procedure: COLONOSCOPY WITH PROPOFOL;  Surgeon: Toney Reil, MD;  Location: [facility];  Service: Endoscopy;  Laterality: N/A;  . ENDOSCOPIC CONCHA BULLOSA RESECTION Left 05/23/2018   Procedure: ENDOSCOPIC CONCHA BULLOSA RESECTION;  Surgeon: Vernie Murders, MD;  Location: [facility];  Service: ENT;  Laterality: Left;  . FOOT SURGERY Right   . 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 SEPTOPLASTY Bilateral 05/23/2018   Procedure: SEPTOPLASTY;  Surgeon: Vernie Murders, MD;  Location: [facility];  Service: ENT;  Laterality: Bilateral;  . TOTAL HIP ARTHROPLASTY Left 10/2008  . TURBINATE REDUCTION Bilateral 05/23/2018   Procedure: INFERIOR TURBINATE REDUCTION;  Surgeon: Vernie Murders, MD;  Location: [facility];  Service: ENT;  Laterality: Bilateral;  . UPPER GI ENDOSCOPY  1992    Social History   Tobacco Use  . Smoking status: Never Smoker  . Smokeless tobacco: Never Used  Substance Use Topics  . Alcohol use: Yes    Comment: socially    Family History  Problem Relation Age of Onset  . Aneurysm Mother   . Stroke Mother   . Heart disease Mother   . Heart disease Father 23  . Hypertension Father   . Lung disease Father   . Heart disease Paternal Grandfather   . Panic disorder Brother   . Milk intolerance Daughter   . Polycystic ovary syndrome Daughter   . Obesity Brother   . Hyperlipidemia Brother   . Hypertension Brother   . Diabetes Brother   . Cancer Neg Hx   . COPD Neg Hx   . Breast cancer Neg Hx     Review of Systems  Constitutional: Positive for fever and malaise/fatigue.  HENT: Positive for congestion and sinus pain. Negative for sore throat.   Respiratory: Positive for cough. Negative for shortness of breath.   Cardiovascular: Negative for chest pain.  Gastrointestinal: Positive for diarrhea and nausea. Negative for constipation and vomiting.  Neurological: Positive for headaches.  Psychiatric/Behavioral: The patient has insomnia.     Objective  Constitutional:      General: Not in acute distress. Pulmonary:     Effort: Pulmonary effort is normal. No respiratory distress.  Neurological:     Mental Status: Alert and oriented to person, place, and time.  Psychiatric:        Mood and Affect: Mood normal.        Behavior: Behavior normal.     ASSESSMENT and PLAN  Problem List Items  Addressed This Visit      Respiratory   Allergic rhinitis - Primary (Chronic)    Other Visit Diagnoses    Migraine without aura and without status migrainosus, not intractable       Relevant Medications   rizatriptan (MAXALT) 5 MG tablet   Cough       Relevant Medications   guaiFENesin-codeine 100-10 MG/5ML syrup      Plan . Continue home allergy medications . Sending cough syrup with codeine for at night . Sending maxalt for headache . RTC/ED precautions provided . Continue to treat symptoms with conservative OTC treatments  Return if symptoms worsen or fail to improve.    The above assessment and management plan was discussed with the patient. The patient verbalized understanding of  and has agreed to the management plan. Patient is aware to call the clinic if symptoms persist or worsen. Patient is aware when to return to the clinic for a follow-up visit. Patient educated on when it is appropriate to go to the emergency department.     Olivia Carls Mame Twombly, FNP-BC [facility]

## 2020-07-22 NOTE — Patient Instructions (Signed)

## 2020-08-02 DIAGNOSIS — M67814 Other specified disorders of tendon, left shoulder: Secondary | ICD-10-CM | POA: Diagnosis not present

## 2020-08-02 DIAGNOSIS — R52 Pain, unspecified: Secondary | ICD-10-CM | POA: Diagnosis not present

## 2020-09-01 DIAGNOSIS — M1712 Unilateral primary osteoarthritis, left knee: Secondary | ICD-10-CM | POA: Diagnosis not present

## 2020-09-01 DIAGNOSIS — M7522 Bicipital tendinitis, left shoulder: Secondary | ICD-10-CM | POA: Diagnosis not present

## 2020-09-20 DIAGNOSIS — M25512 Pain in left shoulder: Secondary | ICD-10-CM | POA: Diagnosis not present

## 2020-09-29 DIAGNOSIS — M25512 Pain in left shoulder: Secondary | ICD-10-CM | POA: Diagnosis not present

## 2020-11-24 ENCOUNTER — Ambulatory Visit: Payer: Self-pay | Admitting: *Deleted

## 2020-11-24 NOTE — Telephone Encounter (Signed)
Pain for several days. Patient state she thinks her acid reflux is acting up, pain with deep breath under R breast Reason for Disposition  Taking a deep breath makes pain worse  Answer Assessment - Initial Assessment Questions 1. LOCATION: "Where does it hurt?"       Under R breath 2. RADIATION: "Does the pain go anywhere else?" (e.g., into neck, jaw, arms, back)     Stomach and under R breast 3. ONSET: "When did the chest pain begin?" (Minutes, hours or days)      Several days 4. PATTERN "Does the pain come and go, or has it been constant since it started?"  "Does it get worse with exertion?"      Constant- yes- does get worse with exertion 5. DURATION: "How long does it last" (e.g., seconds, minutes, hours)     Hours- only goes away when still 6. SEVERITY: "How bad is the pain?"  (e.g., Scale 1-10; mild, moderate, or severe)    - MILD (1-3): doesn't interfere with normal activities     - MODERATE (4-7): interferes with normal activities or awakens from sleep    - SEVERE (8-10): excruciating pain, unable to do any normal activities       moderate 7. CARDIAC RISK FACTORS: "Do you have any history of heart problems or risk factors for heart disease?" (e.g., angina, prior heart attack; diabetes, high blood pressure, high cholesterol, smoker, or strong family history of heart disease)     No- family history 8. PULMONARY RISK FACTORS: "Do you have any history of lung disease?"  (e.g., blood clots in lung, asthma, emphysema, birth control pills)     no 9. CAUSE: "What do you think is causing the chest pain?"     Unsure- acid reflux/IBS- possible pulled muscle 10. OTHER SYMPTOMS: "Do you have any other symptoms?" (e.g., dizziness, nausea, vomiting, sweating, fever, difficulty breathing, cough)       Pain with breathing, sweating with exertion 11. PREGNANCY: "Is there any chance you are pregnant?" "When was your last menstrual period?"       N/a  Protocols used: Chest Pain-A-AH

## 2020-11-24 NOTE — Telephone Encounter (Signed)
Since refuse ER per protocol and we are booked give her 1st available

## 2020-11-24 NOTE — Telephone Encounter (Signed)
Pt has an appt on 11/25/20 with Sheliah Mends

## 2020-11-24 NOTE — Telephone Encounter (Signed)
Patient is calling to report she is having chest pain R breast and pain with breathing on that side. Patient states she does have acid reflux and feels she may have  increased symptoms. Advised per protocol- ED- patient fefuses. Patient states she wants to be seen in office- note sent for review.

## 2020-11-25 ENCOUNTER — Encounter: Payer: Self-pay | Admitting: Family Medicine

## 2020-11-25 ENCOUNTER — Ambulatory Visit (INDEPENDENT_AMBULATORY_CARE_PROVIDER_SITE_OTHER): Payer: Medicare Other | Admitting: Family Medicine

## 2020-11-25 ENCOUNTER — Other Ambulatory Visit: Payer: Self-pay

## 2020-11-25 VITALS — BP 124/78 | HR 91 | Temp 98.0°F | Resp 16 | Ht 64.0 in | Wt 189.7 lb

## 2020-11-25 DIAGNOSIS — R1010 Upper abdominal pain, unspecified: Secondary | ICD-10-CM

## 2020-11-25 DIAGNOSIS — K219 Gastro-esophageal reflux disease without esophagitis: Secondary | ICD-10-CM

## 2020-11-25 DIAGNOSIS — K589 Irritable bowel syndrome without diarrhea: Secondary | ICD-10-CM

## 2020-11-25 MED ORDER — RABEPRAZOLE SODIUM 20 MG PO TBEC: 20.0000 mg | DELAYED_RELEASE_TABLET | Freq: Two times a day (BID) | ORAL | 0 refills | Status: DC

## 2020-11-25 MED ORDER — RABEPRAZOLE SODIUM 20 MG PO TBEC: 20.0000 mg | DELAYED_RELEASE_TABLET | Freq: Every day | ORAL | 3 refills | Status: DC

## 2020-11-25 NOTE — Patient Instructions (Signed)
Take aciphex twice a day and you can also add pepcid over the counter twice a day as needed to help settle the stomach Push clear fluids, avoid spicy and greasy foods.  Info below about possible causes of your discomfort   Cholecystitis  Cholecystitis is irritation and swelling (inflammation) of the gallbladder. The gallbladder is an organ that is shaped like a pear. It is under the liver on the right side of the body. This organ stores bile. Bile helps the body break down (digest) the fats in food. This condition can occur all of a sudden. It needs to be treated. What are the causes? This condition may be caused by stones or lumps that form in the gallbladder (gallstones). Gallstones can block the tube (duct) that carries bile out of your gallbladder. Other causes are: Damage to the gallbladder due to less blood flow. Germs in the bile ducts. Scars or kinks in the bile ducts. Abnormal growths (tumors) in the liver, pancreas, or gallbladder. What increases the risk? You are more likely to develop this condition if: You have sickle cell disease. You take birth control pills.  You use estrogen. You have alcoholic liver disease. You have liver cirrhosis. You are being fed through a vein. You are very ill. You do not eat or drink for a long time. This is also called "fasting." You are overweight (obese). You lose weight too fast. You are pregnant. You have high levels of fat in the blood (triglycerides). You have irritation and swelling of the pancreas (pancreatitis). What are the signs or symptoms? Symptoms of this condition include: Pain in the belly (abdomen). Pain is often in the upper right area of the belly. Tenderness or bloating in the belly. Feeling sick to your stomach (nauseous). Throwing up (vomiting). Fever. Chills. How is this diagnosed? This condition may be diagnosed with a medical history and exam. You may also have other tests, such as: Imaging tests. This may  include: Ultrasound. CT scan of the belly. Nuclear scan. This is also called a HIDA scan. This scan lets your doctor see the bile as it moves in your body. MRI. Blood tests. These are done to check: Your blood count. The white blood cell count may be higher than normal. How well your liver works. How is this treated? This condition may be treated with: Surgery to take out your gallbladder. Antibiotic medicines to treat illnesses caused by germs. Going without food for some time. Giving fluids through an IV tube. Medicines to treat pain or throwing up. Follow these instructions at home: If you had surgery, follow instructions from your doctor about how to care for yourself after you go home. Medicines  Take over-the-counter and prescription medicines only as told by your doctor. If you were prescribed an antibiotic medicine, take it as told by your doctor. Do not stop taking it even if you start to feel better.  General instructions Follow instructions from your doctor about what to eat or drink. Do not eat or drink anything that makes you sick again. Do not lift anything that is heavier than 10 lb (4.5 kg) until your doctor says that it is safe. Do not use any products that contain nicotine or tobacco, such as cigarettes and e-cigarettes. If you need help quitting, ask your doctor. Keep all follow-up visits as told by your doctor. This is important. Contact a doctor if: You have pain and your medicine does not help. You have a fever. Get help right away if: Your  pain moves to: Another part of your belly. Your back. Your symptoms do not go away. You have new symptoms. Summary Cholecystitis is swelling and irritation of the gallbladder. This condition may be caused by stones or lumps that form in the gallbladder (gallstones). Common symptoms are pain in the belly. You may feel sick to your stomach and start throwing up. You may also have a fever and chills. This condition may be  treated with surgery to take out the gallbladder. It may also be treated with medicines, fasting, and fluids through an IV tube. Follow what you are told about eating and drinking. Do not eat things that make you sick again. This information is not intended to replace advice given to you by your health care provider. Make sure you discuss any questions you have with your healthcare provider. Document Revised: 08/03/2017 Document Reviewed: 08/03/2017 Elsevier Patient Education  2022 [employer]  Food Choices for Gastroesophageal Reflux Disease, Adult When you have gastroesophageal reflux disease (GERD), the foods you eat and your eating habits are very important. Choosing the right foods can help ease your discomfort. Think about working with a food expert (dietitian) to help you make good choices. What are tips for following this plan? Reading food labels Look for foods that are low in saturated fat. Foods that may help with your symptoms include: Foods that have less than 5% of daily value (DV) of fat. Foods that have 0 grams of trans fat. Cooking Do not fry your food. Cook your food by baking, steaming, grilling, or broiling. These are all methods that do not need a lot of fat for cooking. To add flavor, try to use herbs that are low in spice and acidity. Meal planning  Choose healthy foods that are low in fat, such as: Fruits and vegetables. Whole grains. Low-fat dairy products. Lean meats, fish, and poultry. Eat small meals often instead of eating 3 large meals each day. Eat your meals slowly in a place where you are relaxed. Avoid bending over or lying down until 2-3 hours after eating. Limit high-fat foods such as fatty meats or fried foods. Limit your intake of fatty foods, such as oils, butter, and shortening. Avoid the following as told by your doctor: Foods that cause symptoms. These may be different for different people. Keep a food diary to keep track of foods that cause  symptoms. Alcohol. Drinking a lot of liquid with meals. Eating meals during the 2-3 hours before bed.  Lifestyle Stay at a healthy weight. Ask your doctor what weight is healthy for you. If you need to lose weight, work with your doctor to do so safely. Exercise for at least 30 minutes on 5 or more days each week, or as told by your doctor. Wear loose-fitting clothes. Do not smoke or use any products that contain nicotine or tobacco. If you need help quitting, ask your doctor. Sleep with the head of your bed higher than your feet. Use a wedge under the mattress or blocks under the bed frame to raise the head of the bed. Chew sugar-free gum after meals. What foods should eat?  Eat a healthy, well-balanced diet of fruits, vegetables, whole grains, low-fatdairy products, lean meats, fish, and poultry. Each person is different. Foods that may cause symptoms in one person may not cause any symptoms inanother person. Work with your doctor to find foods that are safe for you. The items listed above may not be a complete list of what you can eat  and drink. Contact a food expert for more options. What foods should I avoid? Limiting some of these foods may help in managing the symptoms of GERD. Everyone is different. Talk with a food expert or your doctor to help you findthe exact foods to avoid, if any. Fruits Any fruits prepared with added fat. Any fruits that cause symptoms. For some people, this may include citrus fruits, such as oranges, grapefruit, pineapple,and lemons. Vegetables Deep-fried vegetables. [place] fries. Any vegetables prepared with added fat. Any vegetables that cause symptoms. For some people, this may include tomatoesand tomato products, chili peppers, onions and garlic, and horseradish. Grains Pastries or quick breads with added fat. Meats and other proteins High-fat meats, such as fatty beef or pork, hot dogs, ribs, ham, sausage, salami, and bacon. Fried meat or protein,  including fried fish and friedchicken. Nuts and nut butters, in large amounts. Dairy Whole milk and chocolate milk. Sour cream. Cream. Ice cream. Cream cheese.Milkshakes. Fats and oils Butter. Margarine. Shortening. Ghee. Beverages Coffee and tea, with or without caffeine. Carbonated beverages. Sodas. Energy drinks. Fruit juice made with acidic fruits, such as orange or grapefruit.Tomato juice. Alcoholic drinks. Sweets and desserts Chocolate and cocoa. Donuts. Seasonings and condiments Pepper. Peppermint and spearmint. Added salt. Any condiments, herbs, or seasonings that cause symptoms. For some people, this may include curry, hotsauce, or vinegar-based salad dressings. The items listed above may not be a complete list of what you should not eat and drink. Contact a food expert for more options. Questions to ask your doctor Diet and lifestyle changes are often the first steps that are taken to manage symptoms of GERD. If diet and lifestyle changes do not help, talk with yourdoctor about taking medicines. Where to find more information International Foundation for Gastrointestinal Disorders: aboutgerd.org Summary When you have GERD, food and lifestyle choices are very important in easing your symptoms. Eat small meals often instead of 3 large meals a day. Eat your meals slowly and in a place where you are relaxed. Avoid bending over or lying down until 2-3 hours after eating. Limit high-fat foods such as fatty meats or fried foods. This information is not intended to replace advice given to you by your health care provider. Make sure you discuss any questions you have with your healthcare provider. Document Revised: 10/06/2019 Document Reviewed: 10/06/2019 Elsevier Patient Education  2022 Elsevier Inc.  Peptic Ulcer  A peptic ulcer is a painful sore in the lining of your stomach or the firstpart of your small intestine. What are the causes? Common causes of this condition include: An  infection. Using certain pain medicines too often or too much. What increases the risk? You are more likely to get this condition if you: Smoke. Have a family history of ulcer disease. Drink alcohol. Have been hospitalized in an intensive care unit (ICU). What are the signs or symptoms? Symptoms include: Burning pain in the area between the chest and the belly button. The pain may: Not go away (be persistent). Be worse when your stomach is empty. Be worse at night. Heartburn. Feeling sick to your stomach (nauseous) and throwing up (vomiting). Bloating. If the ulcer results in bleeding, it can cause you to: Have poop (stool) that is black and looks like tar. Throw up bright red blood. Throw up material that looks like coffee grounds. How is this treated? Treatment for this condition may include: Stopping things that can cause the ulcer, such as: Smoking. Using pain medicines. Medicines to reduce stomach acid. Antibiotic  medicines if the ulcer is caused by an infection. A procedure that is done using a small, flexible tube that has a camera at the end (upper endoscopy). This may be done if you have a bleeding ulcer. Surgery. This may be needed if: You have a lot of bleeding. The ulcer caused a hole somewhere in the digestive system. Follow these instructions at home: Do not drink alcohol if your doctor tells you not to drink. Limit how much caffeine you take in. Do not use any products that contain nicotine or tobacco, such as cigarettes, e-cigarettes, and chewing tobacco. If you need help quitting, ask your doctor. Take over-the-counter and prescription medicines only as told by your doctor. Do not stop or change your medicines unless you talk with your doctor about it first. Do not take aspirin, ibuprofen, or other NSAIDs unless your doctor told you to do so. Keep all follow-up visits as told by your doctor. This is important. Contact a doctor if: You do not get better in 7  days after you start treatment. You keep having an upset stomach (indigestion) or heartburn. Get help right away if: You have sudden, sharp pain in your belly (abdomen). You have belly pain that does not go away. You have bloody poop (stool) or black, tarry poop. You throw up blood. It may look like coffee grounds. You feel light-headed or feel like you may pass out (faint). You get weak. You get sweaty or feel sticky and cold to the touch (clammy). Summary Symptoms of a peptic ulcer include burning pain in the area between the chest and the belly button. Take medicines only as told by your doctor. Limit how much alcohol and caffeine you have. Keep all follow-up visits as told by your doctor. This information is not intended to replace advice given to you by your health care provider. Make sure you discuss any questions you have with your healthcare provider. Document Revised: 10/02/2017 Document Reviewed: 10/02/2017 Elsevier Patient Education  2022 [employer].

## 2020-11-25 NOTE — Progress Notes (Signed)
Patient ID: Olivia Morgan, female    DOB: 1955-06-25, 65 y.o.   MRN: [id number]  PCP: Danelle Berry, PA-C  Chief Complaint  Patient presents with   Abdominal Pain    Upper abdominal pain and pain on right side under neath breast    Subjective:   Olivia Morgan is a 65 y.o. female, presents to clinic with CC of the following:  Abdominal Pain This is a new problem. The current episode started in the past 7 days. The onset quality is undetermined. The problem occurs constantly. The problem has been unchanged. The pain is located in the epigastric region and RUQ. The pain is moderate. The quality of the pain is aching and dull. The abdominal pain does not radiate. Pertinent negatives include no anorexia, arthralgias, belching, constipation, diarrhea, dysuria, fever, flatus, frequency, headaches, hematochezia, hematuria, melena, myalgias, nausea, vomiting or weight loss. The pain is aggravated by movement, certain positions, deep breathing and palpation. The pain is relieved by Nothing. She has tried nothing (on her daily PPI) for the symptoms. Prior workup: prior EGD and routine colonoscopy. Her past medical history is significant for GERD and irritable bowel syndrome. There is no history of abdominal surgery, colon cancer, Crohn's disease, gallstones, pancreatitis, PUD or ulcerative colitis.   Pt had episode of watery diarrhea about 1.5 weeks ago, very sudden and loose, that resolved in a few days and was similar to past IBS issues, but she then developed RUQ epigastric pain and bloating that seems constant with dgeneralized fatigue.  No N, V, stools returned to normal formed once a day, she denies any blood in stool, urinary sx, vaginal sx.  Only slightly relieves with laying on her left side. No change with or without eating On aciphex long term, EGD done many years ago by Dr. Lemar Livings, colonoscopy done 2 years ago by [facility]  Pt denies any sick contacts, fever, chills, sweats,  rash  Patient Active Problem List   Diagnosis Date Noted   Acute pain of left shoulder 06/08/2020   Rectal bleeding    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   History of total hip replacement 03/05/2017   Allergic reaction 02/12/2017   Dermatitis 02/12/2017   Allergic rhinitis 04/16/2016   Presence of right artificial knee joint 12/16/2015   Obesity (BMI 30.0-34.9) 11/18/2015   Vitamin D deficiency 11/11/2015   Preventative health care 11/11/2015   Mass of left lower leg 09/07/2015   Adverse drug reaction 06/13/2015   Greater trochanteric bursitis of left hip 06/05/2015   Hammer toe of right foot 06/05/2015   Neuropathy of both feet 06/05/2015   Chronic kidney disease, 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/05/2015   Esophageal reflux 03/19/2015   Need for shingles vaccine 03/14/2015   Lower back pain 03/10/2015   Dysphagia 03/10/2015   Herpes simplex virus infection    OA (osteoarthritis) of hip    GERD (gastroesophageal reflux disease)    Plantar fasciitis    Depression    Genital herpes    Bilateral hearing loss    Abnormal ECG 09/09/2014   Shortness of breath 09/09/2014   Hip pain 09/09/2012   Other mechanical complication of other internal orthopedic device, implant, and graft 09/09/2012   Medial collateral ligament sprain of knee 11/13/2011   Other and unspecified derangement of medial meniscus 06/13/2011   Primary localized osteoarthrosis, lower leg 06/13/2011      Current Outpatient Medications:    buPROPion (WELLBUTRIN XL) 150 MG 24 hr  tablet, Take 2 tablets (300 mg total) by mouth daily., Disp: 180 tablet, Rfl: 3   Cholecalciferol (VITAMIN D-3 PO), Take 1,000 Units by mouth once a week. , Disp: , Rfl:    diphenhydrAMINE (BENADRYL) 25 MG tablet, Take 25 mg by mouth every 6 (six) hours as needed., Disp: , Rfl:    guaiFENesin-codeine 100-10 MG/5ML syrup, Take 5 mLs by mouth 3 (three) times daily as needed for cough., Disp: 118 mL, Rfl: 0   meloxicam  (MOBIC) 15 MG tablet, Take 15 mg by mouth daily., Disp: , Rfl:    RABEprazole (ACIPHEX) 20 MG tablet, TAKE 1 TABLET BY MOUTH  DAILY CAUTION PROLONGED USE MAY INCREASE RISK OF  PNEUMONIA COLITIS  OSTEOPOROSIS ANEMIA, Disp: 90 tablet, Rfl: 3   rizatriptan (MAXALT) 5 MG tablet, Take 1 tablet (5 mg total) by mouth as needed for migraine. May repeat in 2 hours if needed, Disp: 10 tablet, Rfl: 0   tiZANidine (ZANAFLEX) 2 MG tablet, Take 1 tablet (2 mg total) by mouth daily as needed for muscle spasms., Disp: 15 tablet, Rfl: 0   triamcinolone (NASACORT) 55 MCG/ACT AERO nasal inhaler, Place 2 sprays into the nose daily., Disp: , Rfl:    valACYclovir (VALTREX) 500 MG tablet, Take 1 tablet (500 mg total) by mouth daily., Disp: 90 tablet, Rfl: 3   VITAMIN E PO, Take by mouth once a week. , Disp: , Rfl:    Allergies  Allergen Reactions   Levofloxacin Hives   Morphine Nausea Only   Celecoxib Other (See Comments)    SOB   Celexa [Citalopram Hydrobromide]    Cymbalta [Duloxetine Hcl] Hives   Gabapentin Other (See Comments)    anger   Levaquin [Levofloxacin In D5w] Hives   Valium [Diazepam] Other (See Comments)    hallucinations   Monistat [Miconazole] Rash   Prednisone Anxiety     Social History   Tobacco Use   Smoking status: Never   Smokeless tobacco: Never  Vaping Use   Vaping Use: Never used  Substance Use Topics   Alcohol use: Yes    Comment: socially   Drug use: No      Chart Review Today: I personally reviewed active problem list, medication list, allergies, family history, social history, health maintenance, notes from last encounter, lab results, imaging with the patient/caregiver today.   Review of Systems  Constitutional: Negative.  Negative for fever and weight loss.  HENT: Negative.    Eyes: Negative.   Respiratory: Negative.    Cardiovascular: Negative.   Gastrointestinal:  Positive for abdominal pain. Negative for anorexia, constipation, diarrhea, flatus,  hematochezia, melena, nausea and vomiting.  Endocrine: Negative.   Genitourinary: Negative.  Negative for dysuria, frequency and hematuria.  Musculoskeletal: Negative.  Negative for arthralgias and myalgias.  Skin: Negative.   Allergic/Immunologic: Negative.   Neurological: Negative.  Negative for headaches.  Hematological: Negative.   Psychiatric/Behavioral: Negative.    All other systems reviewed and are negative.     Objective:   Vitals:   11/25/20 1005  BP: 124/78  Pulse: 91  Resp: 16  Temp: 98 F (36.7 C)  SpO2: 98%  Weight: 189 lb 11.2 oz (86 kg)  Height: 5\' 4"  (1.626 m)    Body mass index is 32.56 kg/m.  Physical Exam Vitals and nursing note reviewed.  Constitutional:      General: She is not in acute distress.    Appearance: She is well-developed and well-groomed. She is obese. She is not ill-appearing, toxic-appearing or  diaphoretic.     Interventions: Face mask in place.  HENT:     Head: Normocephalic and atraumatic.  Eyes:     General:        Right eye: No discharge.        Left eye: No discharge.     Conjunctiva/sclera: Conjunctivae normal.  Cardiovascular:     Rate and Rhythm: Normal rate and regular rhythm.     Pulses: Normal pulses.     Heart sounds: Normal heart sounds. No murmur heard.   No friction rub. No gallop.  Pulmonary:     Effort: Pulmonary effort is normal. No respiratory distress.     Breath sounds: Normal breath sounds. No stridor. No wheezing, rhonchi or rales.  Chest:     Chest wall: No tenderness.     Comments: No chest wall ttp Abdominal:     General: Bowel sounds are normal. There is no distension.     Palpations: Abdomen is soft. There is no hepatomegaly, splenomegaly or mass.     Tenderness: There is abdominal tenderness in the right upper quadrant and epigastric area. There is no right CVA tenderness, left CVA tenderness, guarding or rebound. Negative signs include Murphy's sign, Rovsing's sign and McBurney's sign.      Comments: Soft obese abd  Skin:    General: Skin is warm and dry.     Coloration: Skin is not jaundiced or pale.     Findings: No lesion or rash.  Neurological:     Mental Status: She is alert. Mental status is at baseline.     Gait: Gait normal.  Psychiatric:        Mood and Affect: Mood normal.        Behavior: Behavior normal. Behavior is cooperative.     Results for orders placed or performed in visit on 04/15/20  Novel Coronavirus, NAA ([facility])   Specimen: Nasopharyngeal(NP) swabs in vial transport medium   Nasopharynge  Result Value Ref Range   SARS-CoV-2, NAA Not Detected Not Detected  SARS-COV-2, NAA 2 DAY TAT   Nasopharynge  Result Value Ref Range   SARS-CoV-2, NAA 2 DAY TAT Performed        Assessment & Plan:     ICD-10-CM   1. Pain of upper abdomen  R10.10 Comprehensive metabolic panel    CBC with Differential/Platelet    Lipase    Urine Culture    POCT urinalysis dipstick    US ABDOMEN LIMITED RUQ (LIVER/GB)    Urinalysis, Routine w reflex microscopic   suspect upper GI etiology, increase PPI dose to BID, add pepcid BID PRN, careful with diet, push fluids, labs to r/o UTI, elevated LFT/lipase, high WBC    2. Gastroesophageal reflux disease, unspecified whether esophagitis present  K21.9    on long term PPI, if needed f/up with Byrnett or GI for further eval?    3. Irritable bowel syndrome, unspecified type  K58.9    current pain started after IBS type symptoms and flare - may have possibly been GI virus?     VSS pt well appearing -mild abd ttp to epigastric area and RUQ but neg Murphy's, tolerating PO's, no weight loss, denies blood in stool Expect improvement with increased meds in the next 2 weeks and can do RUQ [place] or have specialist f/up if needed  Return for 2-4 week f/up as needed if not improving.    Danelle Berry, PA-C 11/25/20 10:18 AM

## 2020-11-26 LAB — URINALYSIS, ROUTINE W REFLEX MICROSCOPIC
Bilirubin, UA: NEGATIVE
Glucose, UA: NEGATIVE
Leukocytes,UA: NEGATIVE
Nitrite, UA: NEGATIVE
RBC, UA: NEGATIVE
Specific Gravity, UA: >=1.03 — AB (ref 1.005–1.030)
Urobilinogen, Ur: 1 mg/dL (ref 0.2–1.0)
pH, UA: 6 (ref 5.0–7.5)

## 2020-11-26 LAB — CBC WITH DIFFERENTIAL/PLATELET
Basophils Absolute: 0 10*3/uL (ref 0.0–0.2)
Basos: 0 %
EOS (ABSOLUTE): 0.1 10*3/uL (ref 0.0–0.4)
Eos: 2 %
Hematocrit: 40.2 % (ref 34.0–46.6)
Hemoglobin: 13.6 g/dL (ref 11.1–15.9)
Immature Grans (Abs): 0 10*3/uL (ref 0.0–0.1)
Immature Granulocytes: 0 %
Lymphocytes Absolute: 0.8 10*3/uL (ref 0.7–3.1)
Lymphs: 11 %
MCH: 30.7 pg (ref 26.6–33.0)
MCHC: 33.8 g/dL (ref 31.5–35.7)
MCV: 91 fL (ref 79–97)
Monocytes Absolute: 0.7 10*3/uL (ref 0.1–0.9)
Monocytes: 9 %
Neutrophils Absolute: 5.5 10*3/uL (ref 1.4–7.0)
Neutrophils: 78 %
Platelets: 142 10*3/uL — ABNORMAL LOW (ref 150–450)
RBC: 4.43 x10E6/uL (ref 3.77–5.28)
RDW: 13.4 % (ref 11.7–15.4)
WBC: 7.1 10*3/uL (ref 3.4–10.8)

## 2020-11-26 LAB — COMPREHENSIVE METABOLIC PANEL
ALT: 14 IU/L (ref 0–32)
AST: 13 IU/L (ref 0–40)
Albumin/Globulin Ratio: 2 (ref 1.2–2.2)
Albumin: 4.2 g/dL (ref 3.8–4.8)
Alkaline Phosphatase: 79 IU/L (ref 44–121)
BUN/Creatinine Ratio: 17 (ref 12–28)
BUN: 14 mg/dL (ref 8–27)
Bilirubin Total: 0.5 mg/dL (ref 0.0–1.2)
CO2: 24 mmol/L (ref 20–29)
Calcium: 9.4 mg/dL (ref 8.7–10.3)
Chloride: 100 mmol/L (ref 96–106)
Creatinine, Ser: 0.81 mg/dL (ref 0.57–1.00)
Globulin, Total: 2.1 g/dL (ref 1.5–4.5)
Glucose: 79 mg/dL (ref 65–99)
Potassium: 4.4 mmol/L (ref 3.5–5.2)
Sodium: 140 mmol/L (ref 134–144)
Total Protein: 6.3 g/dL (ref 6.0–8.5)
eGFR: 80 mL/min/{1.73_m2} (ref >59)

## 2020-11-26 LAB — LIPASE: Lipase: 23 U/L (ref 14–72)

## 2020-11-26 LAB — MICROSCOPIC EXAMINATION
Bacteria, UA: NONE SEEN
Casts: NONE SEEN /lpf

## 2020-11-27 LAB — URINE CULTURE

## 2020-11-30 ENCOUNTER — Other Ambulatory Visit: Payer: Self-pay | Admitting: Family Medicine

## 2020-11-30 DIAGNOSIS — Z1231 Encounter for screening mammogram for malignant neoplasm of breast: Secondary | ICD-10-CM

## 2020-12-06 ENCOUNTER — Telehealth: Payer: Self-pay

## 2020-12-06 NOTE — Telephone Encounter (Signed)
Leisa no longer in town pt will need an appt to address concern.

## 2020-12-06 NOTE — Telephone Encounter (Signed)
Copied from CRM [id number]. Topic: General - Other >> Dec 06, 2020 10:27 AM Marylen Ponto wrote: Reason for CRM: Pt would like to know if a Rx for a yeast infection could be sent to [id number] - [place], [place] - [address] Pt requests call back

## 2020-12-07 DIAGNOSIS — N760 Acute vaginitis: Secondary | ICD-10-CM | POA: Diagnosis not present

## 2020-12-07 NOTE — Telephone Encounter (Signed)
Lvm to let the patient know that her provider is out of the office this week and the nurse advised that the patient would need to get an appt with one of the other providers to address this concern.

## 2020-12-09 ENCOUNTER — Ambulatory Visit: Payer: Medicare Other | Admitting: Unknown Physician Specialty

## 2021-01-11 ENCOUNTER — Other Ambulatory Visit: Payer: Self-pay

## 2021-01-11 ENCOUNTER — Ambulatory Visit
Admission: RE | Admit: 2021-01-11 | Discharge: 2021-01-11 | Disposition: A | Discharge status: Home / Self Care | Payer: Medicare Other | Source: Ambulatory Visit | Attending: Family Medicine | Admitting: Family Medicine

## 2021-01-11 DIAGNOSIS — Z1231 Encounter for screening mammogram for malignant neoplasm of breast: Secondary | ICD-10-CM | POA: Insufficient documentation

## 2021-02-02 ENCOUNTER — Encounter: Payer: Self-pay | Admitting: Podiatry

## 2021-02-02 ENCOUNTER — Other Ambulatory Visit: Payer: Self-pay

## 2021-02-02 ENCOUNTER — Ambulatory Visit: Payer: Medicare Other | Admitting: Podiatry

## 2021-02-02 ENCOUNTER — Ambulatory Visit (INDEPENDENT_AMBULATORY_CARE_PROVIDER_SITE_OTHER): Payer: Medicare Other

## 2021-02-02 DIAGNOSIS — M778 Other enthesopathies, not elsewhere classified: Secondary | ICD-10-CM

## 2021-02-02 DIAGNOSIS — M722 Plantar fascial fibromatosis: Secondary | ICD-10-CM

## 2021-02-02 MED ORDER — DEXAMETHASONE SODIUM PHOSPHATE 120 MG/30ML IJ SOLN
2.0000 mg | Freq: Once | INTRAMUSCULAR | Status: AC
  Administered 2021-02-02: 09:00:00 2 mg via INTRA_ARTICULAR

## 2021-02-02 MED ORDER — TRIAMCINOLONE ACETONIDE 40 MG/ML IJ SUSP
20.0000 mg | Freq: Once | INTRAMUSCULAR | Status: AC
  Administered 2021-02-02: 09:00:00 20 mg

## 2021-02-02 NOTE — Progress Notes (Signed)
She presents today after having not seen her for years so with a chief complaint of pain to the lateral aspect of her left foot.  She states is been aching for about a week she declines any injury.  Mornings are particularly bad she is tried meloxicam arch supports in her shoes and Tylenol all to no avail.  Objective: Vital signs are stable alert and oriented x3.  Pulses are palpable.  She has pain on palpation medial calcaneal tubercle of the left heel.  With pain on palpation to the fourth fifth tarsometatarsal joint area secondary to lateral compensatory syndrome.  Radiographs taken today demonstrate soft tissue increase in density plantar fascial pain insertion site no acute findings otherwise noted.  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  Plan: I injected the left heel today 20 mg Kenalog 5 mg Marcaine injected the capsular area fourth fifth TMT joint with 2 mg of dexamethasone local anesthetic.  Tolerated procedure well without complications discussed appropriate shoe gear stretching exercise ice therapy.

## 2021-02-02 NOTE — Patient Instructions (Signed)

## 2021-02-22 ENCOUNTER — Encounter: Payer: Medicare Other | Admitting: Family Medicine

## 2021-02-23 ENCOUNTER — Encounter: Payer: Self-pay | Admitting: Family Medicine

## 2021-02-23 ENCOUNTER — Other Ambulatory Visit: Payer: Self-pay

## 2021-02-23 ENCOUNTER — Ambulatory Visit (INDEPENDENT_AMBULATORY_CARE_PROVIDER_SITE_OTHER): Payer: Medicare Other | Admitting: Family Medicine

## 2021-02-23 VITALS — BP 132/80 | HR 96 | Temp 97.7°F | Resp 16 | Ht 64.0 in | Wt 190.1 lb

## 2021-02-23 DIAGNOSIS — E669 Obesity, unspecified: Secondary | ICD-10-CM | POA: Diagnosis not present

## 2021-02-23 DIAGNOSIS — Z23 Encounter for immunization: Secondary | ICD-10-CM

## 2021-02-23 DIAGNOSIS — A6000 Herpesviral infection of urogenital system, unspecified: Secondary | ICD-10-CM | POA: Diagnosis not present

## 2021-02-23 DIAGNOSIS — K219 Gastro-esophageal reflux disease without esophagitis: Secondary | ICD-10-CM

## 2021-02-23 DIAGNOSIS — E2839 Other primary ovarian failure: Secondary | ICD-10-CM | POA: Diagnosis not present

## 2021-02-23 DIAGNOSIS — Z0000 Encounter for general adult medical examination without abnormal findings: Secondary | ICD-10-CM

## 2021-02-23 DIAGNOSIS — F32A Depression, unspecified: Secondary | ICD-10-CM

## 2021-02-23 DIAGNOSIS — N182 Chronic kidney disease, stage 2 (mild): Secondary | ICD-10-CM

## 2021-02-23 NOTE — Assessment & Plan Note (Signed)
Contributing to orthopedic pain. Recommend weight loss through diet and exercise.

## 2021-02-23 NOTE — Assessment & Plan Note (Signed)
On valtrex daily for suppression. Discussed likely does not need to be on suppressive therapy, only had few outbreaks in the past, not immunocompromised, but patient elects to continue.

## 2021-02-23 NOTE — Patient Instructions (Signed)
It was great to see you!  Our plans for today:  - We ordered a bone density scan, someone will call you with this appointment.  - You received your pneumonia shot today.  - We are checking some labs today, we will release these results to your MyChart.  Take care and seek immediate care sooner if you develop any concerns.   Dr. Linwood Dibbles

## 2021-02-23 NOTE — Progress Notes (Signed)
BP 132/80   Pulse 96   Temp 97.7 F (36.5 C) (Oral)   Resp 16   Ht 5' 4"  (1.626 m)   Wt 190 lb 1.6 oz (86.2 kg)   SpO2 99%   BMI 32.63 kg/m    Subjective:    Patient ID: Olivia Morgan, female    DOB: April 13, 1955, 65 y.o.   MRN: [id number]  HPI: Olivia Morgan is a 65 y.o. female presenting on 02/23/2021 for comprehensive medical examination. Current medical complaints include:none  Depression - Medications: wellbutrin - Taking: good compliance - Counseling: not currently - Previous hospitalizations: no - Symptoms: occasionally feeling down - Current stressors: no  GERD - Meds: rabeprazole daily, gaviscon  - Symptoms:  Nighttime coughing about once per week.  - denies dysphagia has not lost weight denies melena, hematochezia, hematemesis, and coffee ground emesis.   Depression Screen done today and results listed below:  Depression screen [facility] 2/9 02/23/2021 11/25/2020 07/22/2020 06/21/2020 06/08/2020  Decreased Interest 0 1 0 0 0  Down, Depressed, Hopeless 0 0 0 0 1  PHQ - 2 Score 0 1 0 0 1  Altered sleeping 0 0 0 0 0  Tired, decreased energy 0 0 0 0 0  Change in appetite 0 0 0 0 1  Feeling bad or failure about yourself  0 0 0 0 0  Trouble concentrating 0 0 0 0 0  Moving slowly or fidgety/restless 0 0 0 0 0  Suicidal thoughts 0 0 0 0 0  PHQ-9 Score 0 1 0 0 2  Difficult doing work/chores Not difficult at all Not difficult at all Not difficult at all Not difficult at all Not difficult at all  Some recent data might be hidden    The patient does not have a history of falls. I did not complete a risk assessment for falls. A plan of care for falls was not documented.   Past Medical History:  Past Medical History:  Diagnosis Date   Acute pain of left shoulder 06/08/2020   Allergic reaction 02/12/2017   Bilateral hearing loss    wears hearing aids   Depression    Genital herpes    GERD (gastroesophageal reflux disease)    Herpes simplex virus infection    Murmur     Neuropathy of both feet 06/05/2015   OA (osteoarthritis) of hip    left   Osteopenia of lumbar spine 2010   Plantar fasciitis    Stomatitis     Surgical History:  Past Surgical History:  Procedure Laterality Date   CESAREAN SECTION     COLONOSCOPY  2010   COLONOSCOPY WITH PROPOFOL N/A 11/27/2018   Procedure: COLONOSCOPY WITH PROPOFOL;  Surgeon: Lin Landsman, MD;  Location: [place];  Service: Endoscopy;  Laterality: N/A;   ENDOSCOPIC CONCHA BULLOSA RESECTION Left 05/23/2018   Procedure: ENDOSCOPIC CONCHA BULLOSA RESECTION;  Surgeon: Margaretha Sheffield, MD;  Location: [place];  Service: ENT;  Laterality: Left;   FOOT SURGERY Right    REPLACEMENT TOTAL KNEE  3/13   Dr.Hallows at [place] Bilateral 05/23/2018   Procedure: SEPTOPLASTY;  Surgeon: Margaretha Sheffield, MD;  Location: [place];  Service: ENT;  Laterality: Bilateral;   TOTAL HIP ARTHROPLASTY Left 10/2008   TURBINATE REDUCTION Bilateral 05/23/2018   Procedure: INFERIOR TURBINATE REDUCTION;  Surgeon: Margaretha Sheffield, MD;  Location: [place];  Service: ENT;  Laterality: Bilateral;   [place]  Medications:  Current Outpatient Medications on File Prior to Visit  Medication Sig   buPROPion (WELLBUTRIN XL) 150 MG 24 hr tablet Take 2 tablets (300 mg total) by mouth daily.   diphenhydrAMINE (BENADRYL) 25 MG tablet Take 25 mg by mouth every 6 (six) hours as needed.   meloxicam (MOBIC) 15 MG tablet Take 15 mg by mouth daily.   rizatriptan (MAXALT) 5 MG tablet Take 1 tablet (5 mg total) by mouth as needed for migraine. May repeat in 2 hours if needed   valACYclovir (VALTREX) 500 MG tablet Take 500 mg by mouth daily.   RABEprazole (ACIPHEX) 20 MG tablet Take 1 tablet (20 mg total) by mouth 2 (two) times daily for 14 days.   No current facility-administered medications on file prior to visit.    Allergies:  Allergies  Allergen Reactions   Levofloxacin  Hives   Morphine Nausea Only   Celecoxib Other (See Comments)    SOB   Celexa [Citalopram Hydrobromide]    Cymbalta [Duloxetine Hcl] Hives   Gabapentin Other (See Comments)    anger   Levaquin [Levofloxacin In D5w] Hives   Valium [Diazepam] Other (See Comments)    hallucinations   Monistat [Miconazole] Rash   Prednisone Anxiety    Social History:  Social History   Socioeconomic History   Marital status: Divorced    Spouse name: Not on file   Number of children: 1   Years of education: Not on file   Highest education level: Not on file  Occupational History   Not on file  Tobacco Use   Smoking status: Never   Smokeless tobacco: Never  Vaping Use   Vaping Use: Never used  Substance and Sexual Activity   Alcohol use: Yes    Comment: socially   Drug use: No   Sexual activity: Yes    Birth control/protection: Post-menopausal  Other Topics Concern   Not on file  Social History Narrative   Not on file   Social Determinants of Health   Financial Resource Strain: Low Risk    Difficulty of Paying Living Expenses: Not hard at all  Food Insecurity: No Food Insecurity   Worried About Charity fundraiser in the Last Year: Never true   Ran Out of Food in the Last Year: Never true  Transportation Needs: No Transportation Needs   Lack of Transportation (Medical): No   Lack of Transportation (Non-Medical): No  Physical Activity: Insufficiently Active   Days of Exercise per Week: 2 days   Minutes of Exercise per Session: 30 min  Stress: Stress Concern Present   Feeling of Stress : To some extent  Social Connections: Unknown   Frequency of Communication with Friends and Family: Twice a week   Frequency of Social Gatherings with Friends and Family: Twice a week   Attends Religious Services: Never   Marine scientist or Organizations: No   Attends Music therapist: Never   Marital Status: Patient refused  Human resources officer Violence: Not At Risk   Fear of  Current or Ex-Partner: No   Emotionally Abused: No   Physically Abused: No   Sexually Abused: No   Social History   Tobacco Use  Smoking Status Never  Smokeless Tobacco Never   Social History   Substance and Sexual Activity  Alcohol Use Yes   Comment: socially    Family History:  Family History  Problem Relation Age of Onset   Aneurysm Mother    Stroke Mother  Heart disease Mother    Heart disease Father 34   Hypertension Father    Lung disease Father    Heart disease Paternal Grandfather    Panic disorder Brother    Milk intolerance Daughter    Polycystic ovary syndrome Daughter    Obesity Brother    Hyperlipidemia Brother    Hypertension Brother    Diabetes Brother    Cancer Neg Hx    COPD Neg Hx    Breast cancer Neg Hx     Past medical history, surgical history, medications, allergies, family history and social history reviewed with patient today and changes made to appropriate areas of the chart.      Objective:    BP 132/80   Pulse 96   Temp 97.7 F (36.5 C) (Oral)   Resp 16   Ht 5' 4"  (1.626 m)   Wt 190 lb 1.6 oz (86.2 kg)   SpO2 99%   BMI 32.63 kg/m   Wt Readings from Last 3 Encounters:  02/23/21 190 lb 1.6 oz (86.2 kg)  11/25/20 189 lb 11.2 oz (86 kg)  06/21/20 196 lb 1.6 oz (89 kg)    Physical Exam Constitutional:      Appearance: She is obese.  HENT:     Head: Normocephalic.     Right Ear: External ear normal.     Left Ear: External ear normal.  Eyes:     Extraocular Movements: Extraocular movements intact.  Cardiovascular:     Rate and Rhythm: Normal rate and regular rhythm.     Heart sounds: Normal heart sounds. No murmur heard. Pulmonary:     Effort: Pulmonary effort is normal. No respiratory distress.     Breath sounds: Normal breath sounds.  Abdominal:     General: Bowel sounds are normal.     Palpations: Abdomen is soft.     Tenderness: There is no abdominal tenderness.  Musculoskeletal:        General: Normal range of  motion.     Right lower leg: No edema.     Left lower leg: No edema.  Skin:    General: Skin is warm and dry.  Neurological:     Mental Status: She is alert and oriented to person, place, and time. Mental status is at baseline.  Psychiatric:        Mood and Affect: Mood normal.        Behavior: Behavior normal.    Results for orders placed or performed in visit on 11/25/20  Urine Culture   Specimen: Urine   UR  Result Value Ref Range   Urine Culture, Routine Final report    Organism ID, Bacteria Comment   Microscopic Examination  Result Value Ref Range   WBC, UA 0-5 0 - 5 /hpf   RBC 0-2 0 - 2 /hpf   Epithelial Cells (non renal) 0-10 0 - 10 /hpf   Casts None seen None seen /lpf   Bacteria, UA None seen None seen/Few  Comprehensive metabolic panel  Result Value Ref Range   Glucose 79 65 - 99 mg/dL   BUN 14 8 - 27 mg/dL   Creatinine, Ser 0.81 0.57 - 1.00 mg/dL   eGFR 80 >59 mL/min/1.73   BUN/Creatinine Ratio 17 12 - 28   Sodium 140 134 - 144 mmol/L   Potassium 4.4 3.5 - 5.2 mmol/L   Chloride 100 96 - 106 mmol/L   CO2 24 20 - 29 mmol/L   Calcium 9.4 8.7 - 10.3  mg/dL   Total Protein 6.3 6.0 - 8.5 g/dL   Albumin 4.2 3.8 - 4.8 g/dL   Globulin, Total 2.1 1.5 - 4.5 g/dL   Albumin/Globulin Ratio 2.0 1.2 - 2.2   Bilirubin Total 0.5 0.0 - 1.2 mg/dL   Alkaline Phosphatase 79 44 - 121 IU/L   AST 13 0 - 40 IU/L   ALT 14 0 - 32 IU/L  CBC with Differential/Platelet  Result Value Ref Range   WBC 7.1 3.4 - 10.8 x10E3/uL   RBC 4.43 3.77 - 5.28 x10E6/uL   Hemoglobin 13.6 11.1 - 15.9 g/dL   Hematocrit 40.2 34.0 - 46.6 %   MCV 91 79 - 97 fL   MCH 30.7 26.6 - 33.0 pg   MCHC 33.8 31.5 - 35.7 g/dL   RDW 13.4 11.7 - 15.4 %   Platelets 142 (L) 150 - 450 x10E3/uL   Neutrophils 78 Not Estab. %   Lymphs 11 Not Estab. %   Monocytes 9 Not Estab. %   Eos 2 Not Estab. %   Basos 0 Not Estab. %   Neutrophils Absolute 5.5 1.4 - 7.0 x10E3/uL   Lymphocytes Absolute 0.8 0.7 - 3.1 x10E3/uL    Monocytes Absolute 0.7 0.1 - 0.9 x10E3/uL   EOS (ABSOLUTE) 0.1 0.0 - 0.4 x10E3/uL   Basophils Absolute 0.0 0.0 - 0.2 x10E3/uL   Immature Granulocytes 0 Not Estab. %   Immature Grans (Abs) 0.0 0.0 - 0.1 x10E3/uL  Lipase  Result Value Ref Range   Lipase 23 14 - 72 U/L  Urinalysis, Routine w reflex microscopic  Result Value Ref Range   Specific Gravity, UA      >=1.030 (A) 1.005 - 1.030   pH, UA 6.0 5.0 - 7.5   Color, UA Yellow Yellow   Appearance Ur Clear Clear   Leukocytes,UA Negative Negative   Protein,UA 1+ (A) Negative/Trace   Glucose, UA Negative Negative   Ketones, UA Trace (A) Negative   RBC, UA Negative Negative   Bilirubin, UA Negative Negative   Urobilinogen, Ur 1.0 0.2 - 1.0 mg/dL   Nitrite, UA Negative Negative   Microscopic Examination See below:       Assessment & Plan:   Problem List Items Addressed This Visit       Digestive   GERD (gastroesophageal reflux disease)    Doing well on current regimen, no changes made today.        Genitourinary   Genital herpes    On valtrex daily for suppression. Discussed likely does not need to be on suppressive therapy, only had few outbreaks in the past, not immunocompromised, but patient elects to continue.       Relevant Medications   valACYclovir (VALTREX) 500 MG tablet   Chronic kidney disease, stage II (mild)    Recent labs wnl.        Other   Depression    Doing well on current regimen, no changes made today.      Obesity (BMI 30.0-34.9)    Contributing to orthopedic pain. Recommend weight loss through diet and exercise.       Other Visit Diagnoses     Estrogen deficiency    -  Primary   Relevant Orders   DG Bone Density   Class 1 obesity without serious comorbidity in adult, unspecified BMI, unspecified obesity type       Relevant Orders   Lipid panel   Hemoglobin A1c   Need for pneumococcal vaccination  Relevant Orders   Pneumococcal polysaccharide vaccine 23-valent greater than or  equal to 2yo subcutaneous/IM (Completed)        Follow up plan: No follow-ups on file.   LABORATORY TESTING:  - Pap smear: not applicable  IMMUNIZATIONS:   - Tdap: Tetanus vaccination status reviewed: last tetanus booster within 10 years. - Influenza: Refused - Pneumococcal: Administered today - HPV: Not applicable - Shingrix vaccine: Up to date - COVID vaccine: has received 2 doses of mRNA vaccine  SCREENING: -Mammogram: Up to date  - Colonoscopy: Up to date  - Bone Density: Ordered today  - Lung Cancer Screening: Not applicable   Hep C Screening: UTD Sexual History: currently sexually active with one partner Menstrual History/LMP/Abnormal Bleeding: postmenopausal Incontinence Symptoms:   Osteoporosis: Discussed high calcium and vitamin D supplementation, weight bearing exercises  PATIENT COUNSELING:   Advised to take 1 mg of folate supplement per day if capable of pregnancy.   Sexuality: Discussed sexually transmitted diseases, partner selection, use of condoms, avoidance of unintended pregnancy  and contraceptive alternatives.   Advised to avoid cigarette smoking.  I discussed with the patient that most people either abstain from alcohol or drink within safe limits (<=14/week and <=4 drinks/occasion for males, <=7/weeks and <= 3 drinks/occasion for females) and that the risk for alcohol disorders and other health effects rises proportionally with the number of drinks per week and how often a drinker exceeds daily limits.  Discussed cessation/primary prevention of drug use and availability of treatment for abuse.   Diet: Encouraged to adjust caloric intake to maintain  or achieve ideal body weight, to reduce intake of dietary saturated fat and total fat, to limit sodium intake by avoiding high sodium foods and not adding table salt, and to maintain adequate dietary potassium and calcium preferably from fresh fruits, vegetables, and low-fat dairy products.    stressed  the importance of regular exercise  Injury prevention: Discussed safety belts, safety helmets, smoke detector, smoking near bedding or upholstery.   Dental health: Discussed importance of regular tooth brushing, flossing, and dental visits.    NEXT PREVENTATIVE PHYSICAL DUE IN 1 YEAR. No follow-ups on file.

## 2021-02-23 NOTE — Assessment & Plan Note (Signed)
Doing well on current regimen, no changes made today. 

## 2021-02-23 NOTE — Assessment & Plan Note (Signed)
Recent labs wnl.

## 2021-03-02 DIAGNOSIS — E669 Obesity, unspecified: Secondary | ICD-10-CM | POA: Diagnosis not present

## 2021-03-03 LAB — LIPID PANEL
Chol/HDL Ratio: 2.6 ratio (ref 0.0–4.4)
Cholesterol, Total: 171 mg/dL (ref 100–199)
HDL: 67 mg/dL (ref >39)
LDL Chol Calc (NIH): 87 mg/dL (ref 0–99)
Triglycerides: 91 mg/dL (ref 0–149)
VLDL Cholesterol Cal: 17 mg/dL (ref 5–40)

## 2021-03-03 LAB — HEMOGLOBIN A1C
Est. average glucose Bld gHb Est-mCnc: 117 mg/dL
Hgb A1c MFr Bld: 5.7 % — ABNORMAL HIGH (ref 4.8–5.6)

## 2021-03-14 ENCOUNTER — Ambulatory Visit: Payer: Managed Care, Other (non HMO) | Admitting: Podiatry

## 2021-04-15 ENCOUNTER — Telehealth: Payer: Medicare Other | Admitting: Emergency Medicine

## 2021-04-15 ENCOUNTER — Ambulatory Visit: Payer: Self-pay | Admitting: *Deleted

## 2021-04-15 DIAGNOSIS — R051 Acute cough: Secondary | ICD-10-CM | POA: Diagnosis not present

## 2021-04-15 MED ORDER — BENZONATATE 100 MG PO CAPS: 100.0000 mg | ORAL_CAPSULE | Freq: Two times a day (BID) | ORAL | 0 refills | Status: DC | PRN

## 2021-04-15 MED ORDER — DOXYCYCLINE HYCLATE 100 MG PO CAPS: 100.0000 mg | ORAL_CAPSULE | Freq: Two times a day (BID) | ORAL | 0 refills | Status: DC

## 2021-04-15 NOTE — Telephone Encounter (Signed)
Pt called saying she has been having cough and congestion for several weeks.  She ask for an appt and none were avail until middle of the month   CB#  [phone]       Attempted to reach pt. Left VM to call back to discuss symptoms.

## 2021-04-15 NOTE — Progress Notes (Signed)
Virtual Visit Consent   Olivia Morgan, you are scheduled for a virtual visit with a [facility] provider today.     Just as with appointments in the office, your consent must be obtained to participate.  Your consent will be active for this visit and any virtual visit you may have with one of our providers in the next 365 days.     If you have a MyChart account, a copy of this consent can be sent to you electronically.  All virtual visits are billed to your insurance company just like a traditional visit in the office.    As this is a virtual visit, video technology does not allow for your provider to perform a traditional examination.  This may limit your provider's ability to fully assess your condition.  If your provider identifies any concerns that need to be evaluated in person or the need to arrange testing (such as labs, EKG, etc.), we will make arrangements to do so.     Although advances in technology are sophisticated, we cannot ensure that it will always work on either your end or our end.  If the connection with a video visit is poor, the visit may have to be switched to a telephone visit.  With either a video or telephone visit, we are not always able to ensure that we have a secure connection.     I need to obtain your verbal consent now.   Are you willing to proceed with your visit today?    Olivia Morgan has provided verbal consent on 04/15/2021 for a virtual visit video.   Montine Circle, PA-C   Date: 04/15/2021 2:37 PM   Virtual Visit via Video Note   I, Montine Circle, connected with  Olivia Morgan  ([id number], 1955/11/21) on 04/15/21 at  2:30 PM EST by a video-enabled telemedicine application and verified that I am speaking with the correct person using two identifiers.  Location: Patient: Virtual Visit Location Patient: Home Provider: Virtual Visit Location Provider: Home Office   I discussed the limitations of evaluation and management by telemedicine and  the availability of in person appointments. The patient expressed understanding and agreed to proceed.    History of Present Illness: Olivia Morgan is a 66 y.o. who identifies as a female who was assigned female at birth, and is being seen today for CC of cough, congestion, and sore throat.  Improved and then significantly worsened.  Onset was Christmas day.  Now coughing up green phlegm.  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   History of total hip replacement 03/05/2017   Dermatitis 02/12/2017   Allergic rhinitis 04/16/2016   Presence of right artificial knee joint 12/16/2015   Obesity (BMI 30.0-34.9) 11/18/2015   Vitamin D deficiency 11/11/2015   Mass of left lower leg 09/07/2015   Adverse drug reaction 06/13/2015   Greater trochanteric bursitis of left hip 06/05/2015   Hammer toe of right foot 06/05/2015   Neuropathy of both feet 06/05/2015   Chronic kidney disease, 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/05/2015   Lower back pain 03/10/2015   OA (osteoarthritis) of hip    GERD (gastroesophageal reflux disease)    Plantar fasciitis    Depression    Genital herpes    Bilateral hearing loss    Abnormal ECG 09/09/2014  Hip pain 09/09/2012   Other mechanical complication of other internal orthopedic device, implant, and graft 09/09/2012   Medial collateral ligament sprain of knee 11/13/2011   Other and unspecified derangement of medial meniscus 06/13/2011   Primary localized osteoarthrosis, lower leg 06/13/2011    Allergies:  Allergies  Allergen Reactions   Levofloxacin Hives   Morphine Nausea Only   Celecoxib Other (See Comments)    SOB   Celexa [Citalopram Hydrobromide]    Cymbalta [Duloxetine Hcl] Hives   Gabapentin Other (See Comments)    anger   Levaquin [Levofloxacin In D5w] Hives   Valium [Diazepam] Other (See  Comments)    hallucinations   Monistat [Miconazole] Rash   Prednisone Anxiety   Medications:  Current Outpatient Medications:    buPROPion (WELLBUTRIN XL) 150 MG 24 hr tablet, Take 2 tablets (300 mg total) by mouth daily., Disp: 180 tablet, Rfl: 3   diphenhydrAMINE (BENADRYL) 25 MG tablet, Take 25 mg by mouth every 6 (six) hours as needed., Disp: , Rfl:    meloxicam (MOBIC) 15 MG tablet, Take 15 mg by mouth daily., Disp: , Rfl:    RABEprazole (ACIPHEX) 20 MG tablet, Take 1 tablet (20 mg total) by mouth 2 (two) times daily for 14 days., Disp: 28 tablet, Rfl: 0   rizatriptan (MAXALT) 5 MG tablet, Take 1 tablet (5 mg total) by mouth as needed for migraine. May repeat in 2 hours if needed, Disp: 10 tablet, Rfl: 0   valACYclovir (VALTREX) 500 MG tablet, Take 500 mg by mouth daily., Disp: , Rfl:   Observations/Objective: Patient is well-developed, well-nourished in no acute distress.  Resting comfortably at home.  Head is normocephalic, atraumatic.  No labored breathing.  Speech is clear and coherent with logical content.  Patient is alert and oriented at baseline.    Assessment and Plan: 1. Acute cough  -trial doxy and tessalon    Follow Up Instructions: I discussed the assessment and treatment plan with the patient. The patient was provided an opportunity to ask questions and all were answered. The patient agreed with the plan and demonstrated an understanding of the instructions.  A copy of instructions were sent to the patient via MyChart unless otherwise noted below.     The patient was advised to call back or seek an in-person evaluation if the symptoms worsen or if the condition fails to improve as anticipated.  Time:  I spent 14 minutes with the patient via telehealth technology discussing the above problems/concerns.    Montine Circle, PA-C

## 2021-04-15 NOTE — Telephone Encounter (Signed)
° °  Chief Complaint: Cough Symptoms: Cough, sore throat, mild shortness of breath Frequency: Started at Christmas Pertinent Negatives: Patient denies fever Disposition: [] ED /[] Urgent Care (no appt availability in office) / [] Appointment(In office/virtual)/ [x]  [facility]/ [] Home Care/ [] Refused Recommended Disposition /[] [facility]/ []  Follow-up with PCP Additional Notes: Pt. Will do My Chart e-visit.   Reason for Disposition  [1] MILD difficulty breathing (e.g., minimal/no SOB at rest, SOB with walking, pulse <100) AND [2] still present when not coughing  Answer Assessment - Initial Assessment Questions 1. ONSET: "When did the cough begin?"      Christmas 2. SEVERITY: "How bad is the cough today?"      Moderate 3. SPUTUM: "Describe the color of your sputum" (none, dry cough; clear, white, yellow, green)     Green, thick 4. HEMOPTYSIS: "Are you coughing up any blood?" If so ask: "How much?" (flecks, streaks, tablespoons, etc.)     No 5. DIFFICULTY BREATHING: "Are you having difficulty breathing?" If Yes, ask: "How bad is it?" (e.g., mild, moderate, severe)    - MILD: No SOB at rest, mild SOB with walking, speaks normally in sentences, can lie down, no retractions, pulse < 100.    - MODERATE: SOB at rest, SOB with minimal exertion and prefers to sit, cannot lie down flat, speaks in phrases, mild retractions, audible wheezing, pulse 100-120.    - SEVERE: Very SOB at rest, speaks in single words, struggling to breathe, sitting hunched forward, retractions, pulse > 120      Mild 6. FEVER: "Do you have a fever?" If Yes, ask: "What is your temperature, how was it measured, and when did it start?"     No 7. CARDIAC HISTORY: "Do you have any history of heart disease?" (e.g., heart attack, congestive heart failure)      No 8. LUNG HISTORY: "Do you have any history of lung disease?"  (e.g., pulmonary embolus, asthma, emphysema)     Allergies 9. PE RISK FACTORS: "Do you  have a history of blood clots?" (or: recent major surgery, recent prolonged travel, bedridden)     No 10. OTHER SYMPTOMS: "Do you have any other symptoms?" (e.g., runny nose, wheezing, chest pain)       Runny nose, sore throat 11. PREGNANCY: "Is there any chance you are pregnant?" "When was your last menstrual period?"       No 12. TRAVEL: "Have you traveled out of the country in the last month?" (e.g., travel history, exposures)       No  Protocols used: Cough - Acute Productive-A-AH

## 2021-05-05 ENCOUNTER — Other Ambulatory Visit: Payer: Self-pay

## 2021-05-05 ENCOUNTER — Ambulatory Visit (INDEPENDENT_AMBULATORY_CARE_PROVIDER_SITE_OTHER): Payer: Medicare Other | Admitting: Nurse Practitioner

## 2021-05-05 ENCOUNTER — Encounter: Payer: Self-pay | Admitting: Nurse Practitioner

## 2021-05-05 ENCOUNTER — Other Ambulatory Visit (HOSPITAL_COMMUNITY)
Admission: RE | Admit: 2021-05-05 | Discharge: 2021-05-05 | Disposition: A | Discharge status: Home / Self Care | Payer: Medicare Other | Source: Ambulatory Visit | Attending: Family Medicine | Admitting: Family Medicine

## 2021-05-05 VITALS — BP 122/78 | HR 98 | Temp 97.8°F | Resp 18 | Ht 64.0 in | Wt 192.6 lb

## 2021-05-05 DIAGNOSIS — N898 Other specified noninflammatory disorders of vagina: Secondary | ICD-10-CM | POA: Insufficient documentation

## 2021-05-05 NOTE — Progress Notes (Signed)
BP 122/78    Pulse 98    Temp 97.8 F (36.6 C) (Oral)    Resp 18    Ht 5\' 4"  (1.626 m)    Wt 192 lb 9.6 oz (87.4 kg)    SpO2 99%    BMI 33.06 kg/m    Subjective:    Patient ID: Olivia Morgan, female    DOB: [birth year], 66 y.o.   MRN: [id number]  HPI: Olivia Morgan is a 66 y.o. female, here alone  Chief Complaint  Patient presents with   vaginal discomfort   Vaginal itching: She says she has had vaginal itching for a few days now. She said she had the same thing about three months ago and was seen at the [facility].  She says she was treated for a yeast infection and given cortisone cream to help with the itching.  She says she is sexually active she denies any pain during intercourse.  She says she does use toys but she cleans them well before use. She denies vaginal discharge but states she always feels wet. She denies any urinary symptoms.  She says she has restarted using the cortisone cream and it is getting better.  Will get vaginal swab to check for infection.    Relevant past medical, surgical, family and social history reviewed and updated as indicated. Interim medical history since our last visit reviewed. Allergies and medications reviewed and updated.  Review of Systems  Constitutional: Negative for fever or weight change.  Respiratory: Negative for cough and shortness of breath.   Cardiovascular: Negative for chest pain or palpitations.  Gastrointestinal: Negative for abdominal pain, no bowel changes.  Genitourinary: positive for vaginal itching, negative for urinary frequency or dysuria Musculoskeletal: Negative for gait problem or joint swelling.  Skin: Negative for rash.  Neurological: Negative for dizziness or headache.  No other specific complaints in a complete review of systems (except as listed in HPI above).      Objective:    BP 122/78    Pulse 98    Temp 97.8 F (36.6 C) (Oral)    Resp 18    Ht 5\' 4"  (1.626 m)    Wt 192 lb 9.6 oz (87.4 kg)     SpO2 99%    BMI 33.06 kg/m   Wt Readings from Last 3 Encounters:  05/05/21 192 lb 9.6 oz (87.4 kg)  02/23/21 190 lb 1.6 oz (86.2 kg)  11/25/20 189 lb 11.2 oz (86 kg)    Physical Exam  Constitutional: Patient appears well-developed and well-nourished. Obese No distress.  HEENT: head atraumatic, normocephalic, pupils equal and reactive to light, neck supple Cardiovascular: Normal rate, regular rhythm and normal heart sounds.  No murmur heard. No BLE edema. Pulmonary/Chest: Effort normal and breath sounds normal. No respiratory distress. Abdominal: Soft.  There is no tenderness. Pelvic exam: normal external genitalia, no redness or rash, no vaginal discharge noted Psychiatric: Patient has a normal mood and affect. behavior is normal. Judgment and thought content normal.   Results for orders placed or performed in visit on 02/23/21  Lipid panel  Result Value Ref Range   Cholesterol, Total 171 100 - 199 mg/dL   Triglycerides 91 0 - 149 mg/dL   HDL 67 >39 mg/dL   VLDL Cholesterol Cal 17 5 - 40 mg/dL   LDL Chol Calc (NIH) 87 0 - 99 mg/dL   Chol/HDL Ratio 2.6 0.0 - 4.4 ratio  Hemoglobin A1c  Result Value Ref Range  Hgb A1c MFr Bld 5.7 (H) 4.8 - 5.6 %   Est. average glucose Bld gHb Est-mCnc 117 mg/dL      Assessment & Plan:   1. Vaginal itching -continue using cortisone cream, since it is improving - Cervicovaginal ancillary only   Follow up plan: Return if symptoms worsen or fail to improve.

## 2021-05-09 ENCOUNTER — Other Ambulatory Visit: Payer: Self-pay | Admitting: Nurse Practitioner

## 2021-05-09 DIAGNOSIS — B9689 Other specified bacterial agents as the cause of diseases classified elsewhere: Secondary | ICD-10-CM

## 2021-05-09 LAB — CERVICOVAGINAL ANCILLARY ONLY
Bacterial Vaginitis (gardnerella): POSITIVE — AB
Candida Glabrata: NEGATIVE
Candida Vaginitis: NEGATIVE
Chlamydia: NEGATIVE
Comment: NEGATIVE
Comment: NEGATIVE
Comment: NEGATIVE
Comment: NEGATIVE
Comment: NEGATIVE
Comment: NORMAL
Neisseria Gonorrhea: NEGATIVE
Trichomonas: NEGATIVE

## 2021-05-09 MED ORDER — METRONIDAZOLE 500 MG PO TABS: 500.0000 mg | ORAL_TABLET | Freq: Two times a day (BID) | ORAL | 0 refills | Status: AC

## 2021-05-25 ENCOUNTER — Other Ambulatory Visit: Payer: Self-pay | Admitting: Family Medicine

## 2021-05-25 DIAGNOSIS — F320 Major depressive disorder, single episode, mild: Secondary | ICD-10-CM

## 2021-05-25 NOTE — Telephone Encounter (Signed)
Requested Prescriptions  Pending Prescriptions Disp Refills   buPROPion (WELLBUTRIN XL) 150 MG 24 hr tablet [Pharmacy Med Name: BUPROPION XL 150MG  TABLETS (24 H)] 180 tablet 1    Sig: TAKE 2 TABLETS(300 MG) BY MOUTH DAILY     Psychiatry: Antidepressants - bupropion Passed - 05/25/2021  6:22 AM      Passed - Cr in normal range and within 360 days    Creatinine, Ser  Date Value Ref Range Status  11/25/2020 0.81 0.57 - 1.00 mg/dL Final         Passed - AST in normal range and within 360 days    AST  Date Value Ref Range Status  11/25/2020 13 0 - 40 IU/L Final         Passed - ALT in normal range and within 360 days    ALT  Date Value Ref Range Status  11/25/2020 14 0 - 32 IU/L Final         Passed - Completed PHQ-2 or PHQ-9 in the last 360 days      Passed - Last BP in normal range    BP Readings from Last 1 Encounters:  05/05/21 122/78         Passed - Valid encounter within last 6 months    Recent Outpatient Visits          2 weeks ago Vaginal itching   [facility], FNP   3 months ago Estrogen deficiency   [facility] M, DO   6 months ago Pain of upper abdomen   [facility], PA-C   10 months ago Non-seasonal allergic rhinitis, unspecified trigger   [facility], [facility], FNP   11 months ago Acute pain of left shoulder   [facility], Caro Laroche

## 2021-06-06 DIAGNOSIS — M1712 Unilateral primary osteoarthritis, left knee: Secondary | ICD-10-CM | POA: Diagnosis not present

## 2021-06-06 DIAGNOSIS — Z96652 Presence of left artificial knee joint: Secondary | ICD-10-CM | POA: Diagnosis not present

## 2021-07-14 ENCOUNTER — Ambulatory Visit (INDEPENDENT_AMBULATORY_CARE_PROVIDER_SITE_OTHER): Payer: Medicare Other

## 2021-07-14 DIAGNOSIS — Z0000 Encounter for general adult medical examination without abnormal findings: Secondary | ICD-10-CM

## 2021-07-14 NOTE — Patient Instructions (Signed)
Olivia Morgan , ?Thank you for taking time to come for your Medicare Wellness Visit. I appreciate your ongoing commitment to your health goals. Please review the following plan we discussed and let me know if I can assist you in the future.  ? ?Screening recommendations/referrals: ?Colonoscopy: done 11/27/18. Repeat 11/2028 ?Mammogram: done 01/11/21 ?Bone Density: done 12/05/17. Please call [phone] to schedule your bone density screening.  ?Recommended yearly ophthalmology/optometry visit for glaucoma screening and checkup ?Recommended yearly dental visit for hygiene and checkup ? ?Vaccinations: ?Influenza vaccine: declined ?Pneumococcal vaccine: done 02/23/21 ?Tdap vaccine: due ?Shingles vaccine: done 05/16/19 & 09/29/19   ?Covid-19:done 07/05/19 7 08/02/19 ? ?Advanced directives: Advance directive discussed with you today. I have provided a copy for you to complete at home and have notarized. Once this is complete please bring a copy in to our office so we can scan it into your chart.  ? ?Conditions/risks identified: Keep up the great work! ? ?Next appointment: Follow up in one year for your annual wellness visit  ? ? ?Preventive Care 35 Years and Older, Female ?Preventive care refers to lifestyle choices and visits with your health care provider that can promote health and wellness. ?What does preventive care include? ?A yearly physical exam. This is also called an annual well check. ?Dental exams once or twice a year. ?Routine eye exams. Ask your health care provider how often you should have your eyes checked. ?Personal lifestyle choices, including: ?Daily care of your teeth and gums. ?Regular physical activity. ?Eating a healthy diet. ?Avoiding tobacco and drug use. ?Limiting alcohol use. ?Practicing safe sex. ?Taking low-dose aspirin every day. ?Taking vitamin and mineral supplements as recommended by your health care provider. ?What happens during an annual well check? ?The services and screenings done by your  health care provider during your annual well check will depend on your age, overall health, lifestyle risk factors, and family history of disease. ?Counseling  ?Your health care provider may ask you questions about your: ?Alcohol use. ?Tobacco use. ?Drug use. ?Emotional well-being. ?Home and relationship well-being. ?Sexual activity. ?Eating habits. ?History of falls. ?Memory and ability to understand (cognition). ?Work and work Astronomer. ?Reproductive health. ?Screening  ?You may have the following tests or measurements: ?Height, weight, and BMI. ?Blood pressure. ?Lipid and cholesterol levels. These may be checked every 5 years, or more frequently if you are over 63 years old. ?Skin check. ?Lung cancer screening. You may have this screening every year starting at age 44 if you have a 30-pack-year history of smoking and currently smoke or have quit within the past 15 years. ?Fecal occult blood test (FOBT) of the stool. You may have this test every year starting at age 93. ?Flexible sigmoidoscopy or colonoscopy. You may have a sigmoidoscopy every 5 years or a colonoscopy every 10 years starting at age 59. ?Hepatitis C blood test. ?Hepatitis B blood test. ?Sexually transmitted disease (STD) testing. ?Diabetes screening. This is done by checking your blood sugar (glucose) after you have not eaten for a while (fasting). You may have this done every 1-3 years. ?Bone density scan. This is done to screen for osteoporosis. You may have this done starting at age 33. ?Mammogram. This may be done every 1-2 years. Talk to your health care provider about how often you should have regular mammograms. ?Talk with your health care provider about your test results, treatment options, and if necessary, the need for more tests. ?Vaccines  ?Your health care provider may recommend certain vaccines, such as: ?Influenza vaccine.  This is recommended every year. ?Tetanus, diphtheria, and acellular pertussis (Tdap, Td) vaccine. You may  need a Td booster every 10 years. ?Zoster vaccine. You may need this after age 28. ?Pneumococcal 13-valent conjugate (PCV13) vaccine. One dose is recommended after age 73. ?Pneumococcal polysaccharide (PPSV23) vaccine. One dose is recommended after age 73. ?Talk to your health care provider about which screenings and vaccines you need and how often you need them. ?This information is not intended to replace advice given to you by your health care provider. Make sure you discuss any questions you have with your health care provider. ?Document Released: 04/23/2015 Document Revised: 12/15/2015 Document Reviewed: 01/26/2015 ?Elsevier Interactive Patient Education ? 2017 [employer]. ? ?Fall Prevention in the Home ?Falls can cause injuries. They can happen to people of all ages. There are many things you can do to make your home safe and to help prevent falls. ?What can I do on the outside of my home? ?Regularly fix the edges of walkways and driveways and fix any cracks. ?Remove anything that might make you trip as you walk through a door, such as a raised step or threshold. ?Trim any bushes or trees on the path to your home. ?Use bright outdoor lighting. ?Clear any walking paths of anything that might make someone trip, such as rocks or tools. ?Regularly check to see if handrails are loose or broken. Make sure that both sides of any steps have handrails. ?Any raised decks and porches should have guardrails on the edges. ?Have any leaves, snow, or ice cleared regularly. ?Use sand or salt on walking paths during winter. ?Clean up any spills in your garage right away. This includes oil or grease spills. ?What can I do in the bathroom? ?Use night lights. ?Install grab bars by the toilet and in the tub and shower. Do not use towel bars as grab bars. ?Use non-skid mats or decals in the tub or shower. ?If you need to sit down in the shower, use a plastic, non-slip stool. ?Keep the floor dry. Clean up any water that spills on  the floor as soon as it happens. ?Remove soap buildup in the tub or shower regularly. ?Attach bath mats securely with double-sided non-slip rug tape. ?Do not have throw rugs and other things on the floor that can make you trip. ?What can I do in the bedroom? ?Use night lights. ?Make sure that you have a light by your bed that is easy to reach. ?Do not use any sheets or blankets that are too big for your bed. They should not hang down onto the floor. ?Have a firm chair that has side arms. You can use this for support while you get dressed. ?Do not have throw rugs and other things on the floor that can make you trip. ?What can I do in the kitchen? ?Clean up any spills right away. ?Avoid walking on wet floors. ?Keep items that you use a lot in easy-to-reach places. ?If you need to reach something above you, use a strong step stool that has a grab bar. ?Keep electrical cords out of the way. ?Do not use floor polish or wax that makes floors slippery. If you must use wax, use non-skid floor wax. ?Do not have throw rugs and other things on the floor that can make you trip. ?What can I do with my stairs? ?Do not leave any items on the stairs. ?Make sure that there are handrails on both sides of the stairs and use them. Fix handrails  that are broken or loose. Make sure that handrails are as long as the stairways. ?Check any carpeting to make sure that it is firmly attached to the stairs. Fix any carpet that is loose or worn. ?Avoid having throw rugs at the top or bottom of the stairs. If you do have throw rugs, attach them to the floor with carpet tape. ?Make sure that you have a light switch at the top of the stairs and the bottom of the stairs. If you do not have them, ask someone to add them for you. ?What else can I do to help prevent falls? ?Wear shoes that: ?Do not have high heels. ?Have rubber bottoms. ?Are comfortable and fit you well. ?Are closed at the toe. Do not wear sandals. ?If you use a stepladder: ?Make sure  that it is fully opened. Do not climb a closed stepladder. ?Make sure that both sides of the stepladder are locked into place. ?Ask someone to hold it for you, if possible. ?Clearly mark and make sure that

## 2021-07-14 NOTE — Progress Notes (Signed)
? ?Subjective:  ? Olivia Morgan is a 66 y.o. female who presents for an Initial Medicare Annual Wellness Visit. ? ?Virtual Visit via Telephone Note ? ?I connected with  Olivia Morgan on 07/14/21 at  8:45 AM EDT by telephone and verified that I am speaking with the correct person using two identifiers. ? ?Location: ?Patient: home ?Provider: [facility] ?Persons participating in the virtual visit: patient/Nurse Health Advisor ?  ?I discussed the limitations, risks, security and privacy concerns of performing an evaluation and management service by telephone and the availability of in person appointments. The patient expressed understanding and agreed to proceed. ? ?Interactive audio and video telecommunications were attempted between this nurse and patient, however failed, due to patient having technical difficulties OR patient did not have access to video capability.  We continued and completed visit with audio only. ? ?Some vital signs may be absent or patient reported.  ? ?Olivia Littler, LPN ? ? ?Review of Systems    ? ?Cardiac Risk Factors include: advanced age (>6men, >44 women) ? ?   ?Objective:  ?  ?There were no vitals filed for this visit. ?There is no height or weight on file to calculate BMI. ? ? ?  07/14/2021  ?  9:31 AM 11/27/2018  ?  8:56 AM 05/23/2018  ? 10:08 AM 02/06/2017  ?  1:06 PM 11/08/2016  ? 11:14 AM 04/05/2016  ?  3:58 PM 03/07/2016  ? 11:23 AM  ?Advanced Directives  ?Does Patient Have a Medical Advance Directive? Yes Yes Yes Yes Yes Yes Yes  ?Type of Advance Directive Out of facility DNR (pink MOST or yellow form) Healthcare Power of [place];Living will Healthcare Power of [place];Living will Healthcare Power of [place];Living will  Living will   ?Does patient want to make changes to medical advance directive? Yes (MAU/Ambulatory/Procedural Areas - Information given) No - Patient declined       ?Copy of Healthcare Power of Attorney in Chart?  No - copy requested No - copy requested       ? ? ?Current Medications (verified) ?Outpatient Encounter Medications as of 07/14/2021  ?Medication Sig  ? buPROPion (WELLBUTRIN XL) 150 MG 24 hr tablet TAKE 2 TABLETS(300 MG) BY MOUTH DAILY  ? cholecalciferol (VITAMIN D3) 25 MCG (1000 UNIT) tablet Take 1,000 Units by mouth daily.  ? diphenhydrAMINE (BENADRYL) 25 MG tablet Take 25 mg by mouth every 6 (six) hours as needed.  ? meloxicam (MOBIC) 15 MG tablet Take 15 mg by mouth daily.  ? Multiple Vitamins-Minerals (HAIR SKIN AND NAILS FORMULA PO) Take by mouth.  ? valACYclovir (VALTREX) 500 MG tablet Take 500 mg by mouth daily.  ? vitamin E 180 MG (400 UNITS) capsule Take 400 Units by mouth daily.  ? RABEprazole (ACIPHEX) 20 MG tablet Take 1 tablet (20 mg total) by mouth 2 (two) times daily for 14 days.  ? rizatriptan (MAXALT) 5 MG tablet Take 1 tablet (5 mg total) by mouth as needed for migraine. May repeat in 2 hours if needed (Patient not taking: Reported on 07/14/2021)  ? [DISCONTINUED] benzonatate (TESSALON) 100 MG capsule Take 1 capsule (100 mg total) by mouth 2 (two) times daily as needed for cough. (Patient not taking: Reported on 05/05/2021)  ? [DISCONTINUED] doxycycline (VIBRAMYCIN) 100 MG capsule Take 1 capsule (100 mg total) by mouth 2 (two) times daily. (Patient not taking: Reported on 05/05/2021)  ? ?No facility-administered encounter medications on file as of 07/14/2021.  ? ? ?Allergies (verified) ?Levofloxacin, Morphine, Celecoxib, Celexa [citalopram  hydrobromide], Cymbalta [duloxetine hcl], Gabapentin, Levaquin [levofloxacin in d5w], Valium [diazepam], Monistat [miconazole], and Prednisone  ? ?History: ?Past Medical History:  ?Diagnosis Date  ? Acute pain of left shoulder 06/08/2020  ? Allergic reaction 02/12/2017  ? Bilateral hearing loss   ? wears hearing aids  ? Depression   ? Genital herpes   ? GERD (gastroesophageal reflux disease)   ? Herpes simplex virus infection   ? Murmur   ? Neuropathy of both feet 06/05/2015  ? OA (osteoarthritis) of hip   ? left  ?  Osteopenia of lumbar spine 2010  ? Plantar fasciitis   ? Stomatitis   ? ?Past Surgical History:  ?Procedure Laterality Date  ? CESAREAN SECTION    ? COLONOSCOPY  2010  ? COLONOSCOPY WITH PROPOFOL N/A 11/27/2018  ? Procedure: COLONOSCOPY WITH PROPOFOL;  Surgeon: Toney Reil, MD;  [facility];  Service: Endoscopy;  Laterality: N/A;  ? ENDOSCOPIC CONCHA BULLOSA RESECTION Left 05/23/2018  ? Procedure: ENDOSCOPIC CONCHA BULLOSA RESECTION;  Surgeon: Vernie Murders, MD;  Location: [facility];  Service: ENT;  Laterality: Left;  ? FOOT SURGERY Right   ? 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 Dr[facility]Hallows at [facility]  ? SEPTOPLASTY Bilateral 05/23/2018  ? Procedure: SEPTOPLASTY;  Surgeon: Vernie Murders, MD;  Location: [facility];  Service: ENT;  Laterality: Bilateral;  ? TOTAL HIP ARTHROPLASTY Left 10/2008  ? TURBINATE REDUCTION Bilateral 05/23/2018  ? Procedure: INFERIOR TURBINATE REDUCTION;  Surgeon: Vernie Murders, MD;  Location: [facility];  Service: ENT;  Laterality: Bilateral;  ? UPPER GI ENDOSCOPY  1992  ? ?Family History  ?Problem Relation Age of Onset  ? Aneurysm Mother   ? Stroke Mother   ? Heart disease Mother   ? Heart disease Father 71  ? Hypertension Father   ? Lung disease Father   ? Heart disease Paternal Grandfather   ? Panic disorder Brother   ? Milk intolerance Daughter   ? Polycystic ovary syndrome Daughter   ? Obesity Brother   ? Hyperlipidemia Brother   ? Hypertension Brother   ? Diabetes Brother   ? Cancer Neg Hx   ? COPD Neg Hx   ? Breast cancer Neg Hx   ? ?Social History  ? ?Socioeconomic History  ? Marital status: Divorced  ?  Spouse name: Not on file  ? Number of children: 1  ? Years of education: Not on file  ? Highest education level: Not on file  ?Occupational History  ? Not on file  ?Tobacco Use  ? Smoking status: Never  ? Smokeless tobacco: Never  ?Vaping Use  ? Vaping Use: Never used  ?Substance and Sexual Activity  ? Alcohol use: Yes  ?  Comment:  socially  ? Drug use: No  ? Sexual activity: Yes  ?  Birth control/protection: Post-menopausal  ?Other Topics Concern  ? Not on file  ?Social History Narrative  ? Not on file  ? ?Social Determinants of Health  ? ?Financial Resource Strain: Low Risk   ? Difficulty of Paying Living Expenses: Not hard at all  ?Food Insecurity: No Food Insecurity  ? Worried About Programme researcher, broadcasting/film/video in the Last Year: Never true  ? Ran Out of Food in the Last Year: Never true  ?Transportation Needs: No Transportation Needs  ? Lack of Transportation (Medical): No  ? Lack of Transportation (Non-Medical): No  ?Physical Activity: Inactive  ? Days of Exercise per Week: 0 days  ? Minutes  of Exercise per Session: 0 min  ?Stress: No Stress Concern Present  ? Feeling of Stress : Not at all  ?Social Connections: Moderately Isolated  ? Frequency of Communication with Friends and Family: More than three times a week  ? Frequency of Social Gatherings with Friends and Family: Three times a week  ? Attends Religious Services: Never  ? Active Member of Clubs or Organizations: Yes  ? Attends BankerClub or Organization Meetings: More than 4 times per year  ? Marital Status: Divorced  ? ? ?Tobacco Counseling ?Counseling given: Not Answered ? ? ?Clinical Intake: ? ?Pre-visit preparation completed: Yes ? ?Pain : No/denies pain ? ?  ? ?Nutritional Risks: None ?Diabetes: No ? ?How often do you need to have someone help you when you read instructions, pamphlets, or other written materials from your doctor or pharmacy?: 1 - Never ? ? ? ?Interpreter Needed?: No ? ?Information entered by :: Olivia LittlerKasey Forest Pruden LPN ? ? ?Activities of Daily Living ? ?  07/14/2021  ?  9:34 AM 05/05/2021  ?  2:12 PM  ?In your present state of health, do you have any difficulty performing the following activities:  ?Hearing? 1 1  ?Vision? 0 0  ?Difficulty concentrating or making decisions? 0 0  ?Walking or climbing stairs? 1 0  ?Dressing or bathing? 0 0  ?Doing errands, shopping? 0 0  ?Preparing Food  and eating ? N   ?Using the Toilet? N   ?In the past six months, have you accidently leaked urine? N   ?Do you have problems with loss of bowel control? N   ?Managing your Medications? N   ?Managing your Finances?

## 2021-08-02 ENCOUNTER — Other Ambulatory Visit: Payer: Self-pay | Admitting: Family Medicine

## 2021-08-11 DIAGNOSIS — Z96652 Presence of left artificial knee joint: Secondary | ICD-10-CM | POA: Diagnosis not present

## 2021-08-11 DIAGNOSIS — R9431 Abnormal electrocardiogram [ECG] [EKG]: Secondary | ICD-10-CM | POA: Diagnosis not present

## 2021-08-11 DIAGNOSIS — K219 Gastro-esophageal reflux disease without esophagitis: Secondary | ICD-10-CM | POA: Diagnosis not present

## 2021-08-11 DIAGNOSIS — Z01818 Encounter for other preprocedural examination: Secondary | ICD-10-CM | POA: Diagnosis not present

## 2021-08-11 DIAGNOSIS — F341 Dysthymic disorder: Secondary | ICD-10-CM | POA: Diagnosis not present

## 2021-08-11 DIAGNOSIS — R7303 Prediabetes: Secondary | ICD-10-CM | POA: Diagnosis not present

## 2021-08-11 DIAGNOSIS — D696 Thrombocytopenia, unspecified: Secondary | ICD-10-CM | POA: Diagnosis not present

## 2021-08-11 DIAGNOSIS — M1712 Unilateral primary osteoarthritis, left knee: Secondary | ICD-10-CM | POA: Diagnosis not present

## 2021-08-23 ENCOUNTER — Telehealth: Payer: Self-pay | Admitting: Family Medicine

## 2021-08-24 ENCOUNTER — Other Ambulatory Visit: Payer: Self-pay

## 2021-08-24 ENCOUNTER — Other Ambulatory Visit: Payer: Self-pay | Admitting: Nurse Practitioner

## 2021-08-24 NOTE — Telephone Encounter (Signed)
Requested medication (s) are due for refill today:  ? ?Requested medication (s) are on the active medication list: Yes ? ?Last refill:   ? ?Future visit scheduled: No ? ?Notes to clinic:  Historical provider. ? ? ? ?Requested Prescriptions  ?Pending Prescriptions Disp Refills  ? valACYclovir (VALTREX) 500 MG tablet [Pharmacy Med Name: VALACYCLOVIR 500MG  TABLETS] 90 tablet   ?  Sig: TAKE 1 TABLET(500 MG) BY MOUTH DAILY  ?  ? Antimicrobials:  Antiviral Agents - Anti-Herpetic Passed - 08/23/2021  6:21 AM  ?  ?  Passed - Valid encounter within last 12 months  ?  Recent Outpatient Visits   ? ?      ? 3 months ago Vaginal itching  ? [facility] F, FNP  ? 6 months ago Estrogen deficiency  ? [facility] M, DO  ? 9 months ago Pain of upper abdomen  ? [facility], Danelle Berry  ? 1 year ago Non-seasonal allergic rhinitis, unspecified trigger  ? [facility], [facility], FNP  ? 1 year ago Acute pain of left shoulder  ? [facility] M, DO  ? ?  ?  ? ? ?  ?  ?  ? ?

## 2021-08-25 DIAGNOSIS — H35372 Puckering of macula, left eye: Secondary | ICD-10-CM | POA: Diagnosis not present

## 2021-08-26 ENCOUNTER — Other Ambulatory Visit: Payer: Self-pay | Admitting: Family Medicine

## 2021-08-26 ENCOUNTER — Other Ambulatory Visit: Payer: Self-pay

## 2021-08-26 DIAGNOSIS — K219 Gastro-esophageal reflux disease without esophagitis: Secondary | ICD-10-CM

## 2021-08-26 MED ORDER — VALACYCLOVIR HCL 500 MG PO TABS: 500.0000 mg | ORAL_TABLET | Freq: Every day | ORAL | 3 refills | Status: DC

## 2021-08-26 NOTE — Telephone Encounter (Signed)
Pt called in stating she is not sure why it is being refused and requested a call back, please advise.

## 2021-08-26 NOTE — Telephone Encounter (Signed)
Unsure why denied? Rx refill sent to [facility]

## 2021-08-30 DIAGNOSIS — M25562 Pain in left knee: Secondary | ICD-10-CM | POA: Diagnosis not present

## 2021-08-30 DIAGNOSIS — F341 Dysthymic disorder: Secondary | ICD-10-CM | POA: Diagnosis not present

## 2021-08-30 DIAGNOSIS — Z888 Allergy status to other drugs, medicaments and biological substances status: Secondary | ICD-10-CM | POA: Diagnosis not present

## 2021-08-30 DIAGNOSIS — R7303 Prediabetes: Secondary | ICD-10-CM | POA: Diagnosis not present

## 2021-08-30 DIAGNOSIS — D696 Thrombocytopenia, unspecified: Secondary | ICD-10-CM | POA: Diagnosis not present

## 2021-08-30 DIAGNOSIS — F32A Depression, unspecified: Secondary | ICD-10-CM | POA: Diagnosis not present

## 2021-08-30 DIAGNOSIS — G8918 Other acute postprocedural pain: Secondary | ICD-10-CM | POA: Diagnosis not present

## 2021-08-30 DIAGNOSIS — K219 Gastro-esophageal reflux disease without esophagitis: Secondary | ICD-10-CM | POA: Diagnosis not present

## 2021-08-30 DIAGNOSIS — R9431 Abnormal electrocardiogram [ECG] [EKG]: Secondary | ICD-10-CM | POA: Diagnosis not present

## 2021-08-30 DIAGNOSIS — M25762 Osteophyte, left knee: Secondary | ICD-10-CM | POA: Diagnosis not present

## 2021-08-30 DIAGNOSIS — Z96652 Presence of left artificial knee joint: Secondary | ICD-10-CM | POA: Diagnosis not present

## 2021-08-30 DIAGNOSIS — Z885 Allergy status to narcotic agent status: Secondary | ICD-10-CM | POA: Diagnosis not present

## 2021-08-30 DIAGNOSIS — Z79899 Other long term (current) drug therapy: Secondary | ICD-10-CM | POA: Diagnosis not present

## 2021-08-30 DIAGNOSIS — M1712 Unilateral primary osteoarthritis, left knee: Secondary | ICD-10-CM | POA: Diagnosis not present

## 2021-08-30 DIAGNOSIS — Z881 Allergy status to other antibiotic agents status: Secondary | ICD-10-CM | POA: Diagnosis not present

## 2021-08-30 DIAGNOSIS — Z471 Aftercare following joint replacement surgery: Secondary | ICD-10-CM | POA: Diagnosis not present

## 2021-08-31 DIAGNOSIS — Z885 Allergy status to narcotic agent status: Secondary | ICD-10-CM | POA: Diagnosis not present

## 2021-08-31 DIAGNOSIS — F32A Depression, unspecified: Secondary | ICD-10-CM | POA: Diagnosis not present

## 2021-08-31 DIAGNOSIS — Z888 Allergy status to other drugs, medicaments and biological substances status: Secondary | ICD-10-CM | POA: Diagnosis not present

## 2021-08-31 DIAGNOSIS — Z79899 Other long term (current) drug therapy: Secondary | ICD-10-CM | POA: Diagnosis not present

## 2021-08-31 DIAGNOSIS — K219 Gastro-esophageal reflux disease without esophagitis: Secondary | ICD-10-CM | POA: Diagnosis not present

## 2021-08-31 DIAGNOSIS — M25762 Osteophyte, left knee: Secondary | ICD-10-CM | POA: Diagnosis not present

## 2021-08-31 DIAGNOSIS — M1712 Unilateral primary osteoarthritis, left knee: Secondary | ICD-10-CM | POA: Diagnosis not present

## 2021-08-31 DIAGNOSIS — R9431 Abnormal electrocardiogram [ECG] [EKG]: Secondary | ICD-10-CM | POA: Diagnosis not present

## 2021-08-31 DIAGNOSIS — R7303 Prediabetes: Secondary | ICD-10-CM | POA: Diagnosis not present

## 2021-08-31 DIAGNOSIS — D696 Thrombocytopenia, unspecified: Secondary | ICD-10-CM | POA: Diagnosis not present

## 2021-08-31 DIAGNOSIS — F341 Dysthymic disorder: Secondary | ICD-10-CM | POA: Diagnosis not present

## 2021-08-31 DIAGNOSIS — Z881 Allergy status to other antibiotic agents status: Secondary | ICD-10-CM | POA: Diagnosis not present

## 2021-09-01 DIAGNOSIS — K219 Gastro-esophageal reflux disease without esophagitis: Secondary | ICD-10-CM | POA: Diagnosis not present

## 2021-09-01 DIAGNOSIS — M5459 Other low back pain: Secondary | ICD-10-CM | POA: Diagnosis not present

## 2021-09-01 DIAGNOSIS — M1990 Unspecified osteoarthritis, unspecified site: Secondary | ICD-10-CM | POA: Diagnosis not present

## 2021-09-01 DIAGNOSIS — G8929 Other chronic pain: Secondary | ICD-10-CM | POA: Diagnosis not present

## 2021-09-01 DIAGNOSIS — H9190 Unspecified hearing loss, unspecified ear: Secondary | ICD-10-CM | POA: Diagnosis not present

## 2021-09-01 DIAGNOSIS — R0781 Pleurodynia: Secondary | ICD-10-CM | POA: Diagnosis not present

## 2021-09-01 DIAGNOSIS — M21611 Bunion of right foot: Secondary | ICD-10-CM | POA: Diagnosis not present

## 2021-09-01 DIAGNOSIS — M2041 Other hammer toe(s) (acquired), right foot: Secondary | ICD-10-CM | POA: Diagnosis not present

## 2021-09-01 DIAGNOSIS — F341 Dysthymic disorder: Secondary | ICD-10-CM | POA: Diagnosis not present

## 2021-09-01 DIAGNOSIS — M2011 Hallux valgus (acquired), right foot: Secondary | ICD-10-CM | POA: Diagnosis not present

## 2021-09-01 DIAGNOSIS — R7303 Prediabetes: Secondary | ICD-10-CM | POA: Diagnosis not present

## 2021-09-01 DIAGNOSIS — M722 Plantar fascial fibromatosis: Secondary | ICD-10-CM | POA: Diagnosis not present

## 2021-09-01 DIAGNOSIS — Z471 Aftercare following joint replacement surgery: Secondary | ICD-10-CM | POA: Diagnosis not present

## 2021-09-01 DIAGNOSIS — E669 Obesity, unspecified: Secondary | ICD-10-CM | POA: Diagnosis not present

## 2021-09-01 DIAGNOSIS — M7060 Trochanteric bursitis, unspecified hip: Secondary | ICD-10-CM | POA: Diagnosis not present

## 2021-09-01 DIAGNOSIS — Z96652 Presence of left artificial knee joint: Secondary | ICD-10-CM | POA: Diagnosis not present

## 2021-09-04 DIAGNOSIS — M79605 Pain in left leg: Secondary | ICD-10-CM | POA: Diagnosis not present

## 2021-09-04 DIAGNOSIS — M25562 Pain in left knee: Secondary | ICD-10-CM | POA: Diagnosis not present

## 2021-09-04 DIAGNOSIS — G8918 Other acute postprocedural pain: Secondary | ICD-10-CM | POA: Diagnosis not present

## 2021-09-04 DIAGNOSIS — M25462 Effusion, left knee: Secondary | ICD-10-CM | POA: Diagnosis not present

## 2021-09-04 DIAGNOSIS — Z96652 Presence of left artificial knee joint: Secondary | ICD-10-CM | POA: Diagnosis not present

## 2021-10-01 DIAGNOSIS — H9190 Unspecified hearing loss, unspecified ear: Secondary | ICD-10-CM | POA: Diagnosis not present

## 2021-10-01 DIAGNOSIS — M2011 Hallux valgus (acquired), right foot: Secondary | ICD-10-CM | POA: Diagnosis not present

## 2021-10-01 DIAGNOSIS — M2041 Other hammer toe(s) (acquired), right foot: Secondary | ICD-10-CM | POA: Diagnosis not present

## 2021-10-01 DIAGNOSIS — Z96652 Presence of left artificial knee joint: Secondary | ICD-10-CM | POA: Diagnosis not present

## 2021-10-01 DIAGNOSIS — M1990 Unspecified osteoarthritis, unspecified site: Secondary | ICD-10-CM | POA: Diagnosis not present

## 2021-10-01 DIAGNOSIS — Z471 Aftercare following joint replacement surgery: Secondary | ICD-10-CM | POA: Diagnosis not present

## 2021-10-01 DIAGNOSIS — R0781 Pleurodynia: Secondary | ICD-10-CM | POA: Diagnosis not present

## 2021-10-01 DIAGNOSIS — F341 Dysthymic disorder: Secondary | ICD-10-CM | POA: Diagnosis not present

## 2021-10-01 DIAGNOSIS — M21611 Bunion of right foot: Secondary | ICD-10-CM | POA: Diagnosis not present

## 2021-10-01 DIAGNOSIS — K219 Gastro-esophageal reflux disease without esophagitis: Secondary | ICD-10-CM | POA: Diagnosis not present

## 2021-10-01 DIAGNOSIS — G8929 Other chronic pain: Secondary | ICD-10-CM | POA: Diagnosis not present

## 2021-10-01 DIAGNOSIS — E669 Obesity, unspecified: Secondary | ICD-10-CM | POA: Diagnosis not present

## 2021-10-01 DIAGNOSIS — M5459 Other low back pain: Secondary | ICD-10-CM | POA: Diagnosis not present

## 2021-10-01 DIAGNOSIS — M722 Plantar fascial fibromatosis: Secondary | ICD-10-CM | POA: Diagnosis not present

## 2021-10-01 DIAGNOSIS — R7303 Prediabetes: Secondary | ICD-10-CM | POA: Diagnosis not present

## 2021-10-01 DIAGNOSIS — M7060 Trochanteric bursitis, unspecified hip: Secondary | ICD-10-CM | POA: Diagnosis not present

## 2021-10-04 ENCOUNTER — Ambulatory Visit
Admission: RE | Admit: 2021-10-04 | Discharge: 2021-10-04 | Disposition: A | Discharge status: Home / Self Care | Payer: Medicare Other | Source: Ambulatory Visit | Attending: Family Medicine | Admitting: Family Medicine

## 2021-10-04 DIAGNOSIS — Z780 Asymptomatic menopausal state: Secondary | ICD-10-CM | POA: Diagnosis not present

## 2021-10-04 DIAGNOSIS — E2839 Other primary ovarian failure: Secondary | ICD-10-CM | POA: Diagnosis not present

## 2021-12-03 ENCOUNTER — Other Ambulatory Visit: Payer: Self-pay | Admitting: Internal Medicine

## 2021-12-03 DIAGNOSIS — F320 Major depressive disorder, single episode, mild: Secondary | ICD-10-CM

## 2021-12-05 ENCOUNTER — Other Ambulatory Visit: Payer: Self-pay | Admitting: Family Medicine

## 2021-12-05 DIAGNOSIS — K219 Gastro-esophageal reflux disease without esophagitis: Secondary | ICD-10-CM

## 2021-12-05 NOTE — Telephone Encounter (Signed)
Courtesy refill. Please call and make a follow up appointment. Requested Prescriptions  Pending Prescriptions Disp Refills  . buPROPion (WELLBUTRIN XL) 150 MG 24 hr tablet [Pharmacy Med Name: BUPROPION XL 150MG  TABLETS (24 H)] 60 tablet 0    Sig: TAKE 2 TABLETS(300 MG) BY MOUTH DAILY     Psychiatry: Antidepressants - bupropion Failed - 12/03/2021 11:12 AM      Failed - Cr in normal range and within 360 days    Creatinine, Ser  Date Value Ref Range Status  11/25/2020 0.81 0.57 - 1.00 mg/dL Final         Failed - AST in normal range and within 360 days    AST  Date Value Ref Range Status  11/25/2020 13 0 - 40 IU/L Final         Failed - ALT in normal range and within 360 days    ALT  Date Value Ref Range Status  11/25/2020 14 0 - 32 IU/L Final         Failed - Valid encounter within last 6 months    Recent Outpatient Visits          7 months ago Vaginal itching   [facility], FNP   9 months ago Estrogen deficiency   [facility], DO   1 year ago Pain of upper abdomen   [facility], PA-C   1 year ago Non-seasonal allergic rhinitis, unspecified trigger   [facility], [facility], FNP   1 year ago Acute pain of left shoulder   [facility] M, DO             Passed - Completed PHQ-2 or PHQ-9 in the last 360 days      Passed - Last BP in normal range    BP Readings from Last 1 Encounters:  05/05/21 122/78

## 2021-12-05 NOTE — Telephone Encounter (Signed)
Medication Refill - Medication: valACYclovir (VALTREX) 500 MG tablet  Has the patient contacted their pharmacy? Yes.    (Agent: If yes, when and what did the pharmacy advise?) call provider   Preferred Pharmacy (with phone number or street name):  [id number] [place], [place] - [address] AT [facility] [address] Phone:  [phone]  Fax:  [phone]     Has the patient been seen for an appointment in the last year OR does the patient have an upcoming appointment? Yes.    Pt requesting new rx  Pt states she no longer uses [facility] and has not received 08-26-2021 medication

## 2021-12-05 NOTE — Telephone Encounter (Signed)
Called pt LMOMTCB for ov.

## 2021-12-06 MED ORDER — VALACYCLOVIR HCL 500 MG PO TABS: 500.0000 mg | ORAL_TABLET | Freq: Every day | ORAL | 1 refills | Status: DC

## 2021-12-06 NOTE — Telephone Encounter (Signed)
Requested Prescriptions  Pending Prescriptions Disp Refills  . valACYclovir (VALTREX) 500 MG tablet 90 tablet 1    Sig: Take 1 tablet (500 mg total) by mouth daily.     Antimicrobials:  Antiviral Agents - Anti-Herpetic Passed - 12/05/2021  2:06 PM      Passed - Valid encounter within last 12 months    Recent Outpatient Visits          7 months ago Vaginal itching   [facility] Berniece Salines, FNP   9 months ago Estrogen deficiency   [facility] Caro Laroche, DO   1 year ago Pain of upper abdomen   [facility] Danelle Berry, PA-C   1 year ago Non-seasonal allergic rhinitis, unspecified trigger   [facility] Just, Azalee Course, FNP   1 year ago Acute pain of left shoulder   [facility] Caro Laroche, DO      Future Appointments            In 2 months Danelle Berry, PA-C [facility], [facility]

## 2021-12-14 ENCOUNTER — Other Ambulatory Visit: Payer: Self-pay | Admitting: Family Medicine

## 2021-12-14 DIAGNOSIS — R1010 Upper abdominal pain, unspecified: Secondary | ICD-10-CM

## 2021-12-14 NOTE — Telephone Encounter (Signed)
Medication Refill - Medication: RABEprazole (ACIPHEX) 20 MG tablet  Has the patient contacted their pharmacy? Yes.   Provider declined request.  Preferred Pharmacy (with phone number or street name):  [facility] #[id number] [place], [place] - [address] AT [facility] OF [address]  [address] [place], [place] [address]  Phone:  [phone]  Fax:  [phone]  Has the patient been seen for an appointment in the last year OR does the patient have an upcoming appointment? Yes.    Patient is requesting a short supply to get her to her appointment on 9/12. Patient was scheduled for next available with pcp. Please follow up with patient.

## 2021-12-15 ENCOUNTER — Other Ambulatory Visit: Payer: Self-pay | Admitting: Family Medicine

## 2021-12-15 DIAGNOSIS — R1010 Upper abdominal pain, unspecified: Secondary | ICD-10-CM

## 2021-12-15 NOTE — Telephone Encounter (Signed)
Copied from CRM [id number]. Topic: General - Other >> Dec 15, 2021  2:02 PM Everette C wrote: Reason for CRM: The patient has called for an update on their previously requested prescription for RABEprazole (ACIPHEX) 20 MG tablet  Please contact further with additional information when possible

## 2021-12-15 NOTE — Telephone Encounter (Signed)
Requested medication (s) are due for refill today - expired Rx  Requested medication (s) are on the active medication list -yes  Future visit scheduled -yes  Last refill: 11/25/20 #28  Notes to clinic: expired Rx, patient has scheduled appointment- requesting short supply- request sent for review   Requested Prescriptions  Pending Prescriptions Disp Refills   RABEprazole (ACIPHEX) 20 MG tablet 28 tablet 0    Sig: Take 1 tablet (20 mg total) by mouth 2 (two) times daily for 14 days.     Gastroenterology: Proton Pump Inhibitors Passed - 12/14/2021  5:05 PM      Passed - Valid encounter within last 12 months    Recent Outpatient Visits           7 months ago Vaginal itching   [facility] Berniece Salines, FNP   9 months ago Estrogen deficiency   [facility] Caro Laroche, DO   1 year ago Pain of upper abdomen   [facility] Danelle Berry, PA-C   1 year ago Non-seasonal allergic rhinitis, unspecified trigger   [facility] Just, Azalee Course, FNP   1 year ago Acute pain of left shoulder   [facility] Caro Laroche, DO       Future Appointments             In 5 days Danelle Berry, PA-C [facility], [facility]   In 2 months Danelle Berry, PA-C [facility], [facility]            Refused Prescriptions Disp Refills   RABEprazole (ACIPHEX) 20 MG tablet [Pharmacy Med Name: RABEPRAZOLE DR 20MG  TABLETS] 90 tablet     Sig: TAKE 1 TABLET(20 MG) BY MOUTH DAILY     Gastroenterology: Proton Pump Inhibitors Passed - 12/14/2021  5:05 PM      Passed - Valid encounter within last 12 months    Recent Outpatient Visits           7 months ago Vaginal itching   [facility], FNP   9 months ago Estrogen deficiency   [facility], DO   1 year ago Pain of upper abdomen   [facility], PA-C   1 year ago Non-seasonal allergic rhinitis, unspecified trigger   [facility] Just, [facility], FNP   1 year ago Acute pain of left shoulder   [facility], DO       Future Appointments             In 5 days Caro Laroche, PA-C [facility], [facility]   In 2 months [facility], PA-C [facility], [facility]               Requested Prescriptions  Pending Prescriptions Disp Refills   RABEprazole (ACIPHEX) 20 MG tablet 28 tablet 0    Sig: Take 1 tablet (20 mg total) by mouth 2 (two) times daily for 14 days.     Gastroenterology: Proton Pump Inhibitors Passed - 12/14/2021  5:05 PM      Passed - Valid encounter within last 12 months    Recent Outpatient Visits           7 months ago Vaginal itching   [facility], FNP   9 months ago Estrogen deficiency  [facility] Caro Laroche, DO   1 year ago Pain of upper abdomen   [facility] Danelle Berry, PA-C   1 year ago Non-seasonal allergic rhinitis, unspecified trigger   [facility] Just, Azalee Course, FNP   1 year ago Acute pain of left shoulder   [facility] Caro Laroche, DO       Future Appointments             In 5 days Danelle Berry, PA-C [facility], [facility]   In 2 months Danelle Berry, PA-C [facility], [facility]            Refused Prescriptions Disp Refills   RABEprazole (ACIPHEX) 20 MG tablet [Pharmacy Med Name: RABEPRAZOLE DR 20MG  TABLETS] 90 tablet     Sig: TAKE 1 TABLET(20 MG) BY MOUTH DAILY     Gastroenterology: Proton Pump Inhibitors Passed - 12/14/2021  5:05 PM      Passed - Valid encounter within last 12 months    Recent Outpatient Visits           7 months ago Vaginal itching   [facility], FNP   9 months ago  Estrogen deficiency   [facility], DO   1 year ago Pain of upper abdomen   [facility], PA-C   1 year ago Non-seasonal allergic rhinitis, unspecified trigger   [facility], [facility], FNP   1 year ago Acute pain of left shoulder   [facility], DO       Future Appointments             In 5 days Caro Laroche, PA-C [facility], [facility]   In 2 months [facility], [facility]

## 2021-12-15 NOTE — Telephone Encounter (Signed)
Requested medication (s) are due for refill today: yes  Requested medication (s) are on the active medication list: yes  Last refill:  11/25/20 #28/0  Future visit scheduled: yes on 12/20/21  Notes to clinic:  pt has scheduled an appt and requesting update on refill. Please advise      Requested Prescriptions  Pending Prescriptions Disp Refills   RABEprazole (ACIPHEX) 20 MG tablet 28 tablet 0    Sig: Take 1 tablet (20 mg total) by mouth 2 (two) times daily for 14 days.     Gastroenterology: Proton Pump Inhibitors Passed - 12/15/2021  3:22 PM      Passed - Valid encounter within last 12 months    Recent Outpatient Visits           7 months ago Vaginal itching   [facility] Berniece Salines, FNP   9 months ago Estrogen deficiency   [facility] Caro Laroche, DO   1 year ago Pain of upper abdomen   [facility] Danelle Berry, PA-C   1 year ago Non-seasonal allergic rhinitis, unspecified trigger   [facility] Just, Azalee Course, FNP   1 year ago Acute pain of left shoulder   [facility] Caro Laroche, DO       Future Appointments             In 5 days Danelle Berry, PA-C [facility]   In 2 months Danelle Berry, PA-C [facility], [facility]

## 2021-12-15 NOTE — Telephone Encounter (Signed)
Pt is calling back. She is upset that she is required to have an appt for this medication when she has been on it for 31 years and never had an issue with getting medication previously. Advised pt of appts available for tomorrow open since pt said she was unable to go 4-5 days without medication. Pt scheduled appt for 1040 tomorrow with Dr. Caralee Ates.

## 2021-12-16 ENCOUNTER — Ambulatory Visit (INDEPENDENT_AMBULATORY_CARE_PROVIDER_SITE_OTHER): Payer: Medicare Other | Admitting: Internal Medicine

## 2021-12-16 ENCOUNTER — Encounter: Payer: Self-pay | Admitting: Internal Medicine

## 2021-12-16 VITALS — BP 130/80 | HR 89 | Temp 97.5°F | Resp 16 | Ht 64.0 in | Wt 190.3 lb

## 2021-12-16 DIAGNOSIS — S39012A Strain of muscle, fascia and tendon of lower back, initial encounter: Secondary | ICD-10-CM | POA: Diagnosis not present

## 2021-12-16 DIAGNOSIS — K2100 Gastro-esophageal reflux disease with esophagitis, without bleeding: Secondary | ICD-10-CM

## 2021-12-16 DIAGNOSIS — F320 Major depressive disorder, single episode, mild: Secondary | ICD-10-CM | POA: Diagnosis not present

## 2021-12-16 MED ORDER — BUPROPION HCL ER (XL) 150 MG PO TB24: 150.0000 mg | ORAL_TABLET | Freq: Two times a day (BID) | ORAL | 1 refills | Status: DC

## 2021-12-16 MED ORDER — RABEPRAZOLE SODIUM 20 MG PO TBEC: 20.0000 mg | DELAYED_RELEASE_TABLET | Freq: Every day | ORAL | 3 refills | Status: DC

## 2021-12-16 MED ORDER — MELOXICAM 15 MG PO TABS: 15.0000 mg | ORAL_TABLET | Freq: Every day | ORAL | 0 refills | Status: DC

## 2021-12-16 MED ORDER — TIZANIDINE HCL 4 MG PO TABS: 4.0000 mg | ORAL_TABLET | Freq: Four times a day (QID) | ORAL | 0 refills | Status: DC | PRN

## 2021-12-16 NOTE — Progress Notes (Signed)
Established Patient Office Visit  Subjective   Patient ID: Olivia Morgan, female    DOB: 03-Mar-1956  Age: 66 y.o. MRN: [id number]  Chief Complaint  Patient presents with   Medication Refill    Rabeprazole    HPI Patient is here for follow up on chronic medical conditions and medication refills. Also is having some back pain today.   BACK PAIN Duration: days Mechanism of injury:  was clearing branches of her yard Location: bilateral and low back Onset: sudden Severity: moderate Quality: dull and aching Frequency: intermittent Radiation: none Aggravating factors: movement and prolonged sitting Alleviating factors: rest and heat Status: fluctuating Treatments attempted: rest and heat   MDD: -Mood status: stable -Current treatment: Wellbutrin XL 300 mg  -Satisfied with current treatment?: yes -Duration of current treatment : chronic -Side effects: no Medication compliance: excellent compliance     12/16/2021   10:50 AM 07/14/2021    9:30 AM 05/05/2021    2:12 PM 02/23/2021    8:46 AM 11/25/2020   10:14 AM  Depression screen PHQ 2/9  Decreased Interest 0 0 0 0 1  Down, Depressed, Hopeless 0 0 0 0 0  PHQ - 2 Score 0 0 0 0 1  Altered sleeping 0  0 0 0  Tired, decreased energy 0  0 0 0  Change in appetite 0  0 0 0  Feeling bad or failure about yourself  0  0 0 0  Trouble concentrating 0  0 0 0  Moving slowly or fidgety/restless 0  0 0 0  Suicidal thoughts 0  0 0 0  PHQ-9 Score 0  0 0 1  Difficult doing work/chores Not difficult at all  Not difficult at all Not difficult at all Not difficult at all   GERD: -Currently on Achipex 20 mg daily, controls symptoms well but has been out of it for a few days now -EGD 03/2015 negative for h pylori, showing esophagitis without bleeding  Migraines: -Currently on Maxalt 5 mg PRN - only took once   HSV:  -Currently on Valtrex 500 mg daily  -Hasn't had any flares in years  Health Maintenance: -Blood work UTD -Mammogram  10/22, Birads-1 -Colon cancer screening: colonoscopy 11/2018 repeat in 10 years   Patient Active Problem List   Diagnosis Date Noted   Rectal bleeding    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   History of total hip replacement 03/05/2017   Dermatitis 02/12/2017   Allergic rhinitis 04/16/2016   Presence of right artificial knee joint 12/16/2015   Obesity (BMI 30.0-34.9) 11/18/2015   Vitamin D deficiency 11/11/2015   Mass of left lower leg 09/07/2015   Adverse drug reaction 06/13/2015   Greater trochanteric bursitis of left hip 06/05/2015   Hammer toe of right foot 06/05/2015   Neuropathy of both feet 06/05/2015   Chronic kidney disease, 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/05/2015   Lower back pain 03/10/2015   OA (osteoarthritis) of hip    GERD (gastroesophageal reflux disease)    Plantar fasciitis    Depression    Genital herpes    Bilateral hearing loss    Abnormal ECG 09/09/2014   Hip pain 09/09/2012   Other mechanical complication of other internal orthopedic device, implant, and graft 09/09/2012   Medial collateral ligament sprain of knee 11/13/2011   Other and unspecified derangement of medial meniscus 06/13/2011   Primary localized osteoarthrosis, lower leg 06/13/2011   Past Medical History:  Diagnosis Date   Acute  pain of left shoulder 06/08/2020   Allergic reaction 02/12/2017   Bilateral hearing loss    wears hearing aids   Depression    Genital herpes    GERD (gastroesophageal reflux disease)    Herpes simplex virus infection    Murmur    Neuropathy of both feet 06/05/2015   OA (osteoarthritis) of hip    left   Osteopenia of lumbar spine 2010   Plantar fasciitis    Stomatitis    Past Surgical History:  Procedure Laterality Date   CESAREAN SECTION     COLONOSCOPY  2010   COLONOSCOPY WITH PROPOFOL N/A 11/27/2018   Procedure: COLONOSCOPY WITH PROPOFOL;  Surgeon: Lin Landsman, MD;  Location: [facility];  Service: Endoscopy;   Laterality: N/A;   ENDOSCOPIC CONCHA BULLOSA RESECTION Left 05/23/2018   Procedure: ENDOSCOPIC CONCHA BULLOSA RESECTION;  Surgeon: Margaretha Sheffield, MD;  Location: [facility];  Service: ENT;  Laterality: Left;   FOOT SURGERY Right    REPLACEMENT TOTAL KNEE  3/13   Dr.Hall[place] at [place] Bilateral 05/23/2018   Procedure: SEPTOPLASTY;  Surgeon: Margaretha Sheffield, MD;  Location: [facility];  Service: ENT;  Laterality: Bilateral;   TOTAL HIP ARTHROPLASTY Left 10/2008   TURBINATE REDUCTION Bilateral 05/23/2018   Procedure: INFERIOR TURBINATE REDUCTION;  Surgeon: Margaretha Sheffield, MD;  Location: [facility];  Service: ENT;  Laterality: Bilateral;   UPPER GI ENDOSCOPY  1992   Social History   Tobacco Use   Smoking status: Never   Smokeless tobacco: Never  Vaping Use   Vaping Use: Never used  Substance Use Topics   Alcohol use: Yes    Comment: socially   Drug use: No   Social History   Socioeconomic History   Marital status: Divorced    Spouse name: Not on file   Number of children: 1   Years of education: Not on file   Highest education level: Not on file  Occupational History   Not on file  Tobacco Use   Smoking status: Never   Smokeless tobacco: Never  Vaping Use   Vaping Use: Never used  Substance and Sexual Activity   Alcohol use: Yes    Comment: socially   Drug use: No   Sexual activity: Yes    Birth control/protection: Post-menopausal  Other Topics Concern   Not on file  Social History Narrative   Not on file   Social Determinants of Health   Financial Resource Strain: Low Risk  (07/14/2021)   Overall Financial Resource Strain (CARDIA)    Difficulty of Paying Living Expenses: Not hard at all  Food Insecurity: No Food Insecurity (07/14/2021)   Hunger Vital Sign    Worried About Running Out of Food in the Last Year: Never true    Ran Out of Food in the Last Year: Never true  Transportation Needs: No Transportation Needs (07/14/2021)    PRAPARE - Hydrologist (Medical): No    Lack of Transportation (Non-Medical): No  Physical Activity: Inactive (07/14/2021)   Exercise Vital Sign    Days of Exercise per Week: 0 days    Minutes of Exercise per Session: 0 min  Stress: No Stress Concern Present (07/14/2021)   [place]    Feeling of Stress : Not at all  Social Connections: Moderately Isolated (07/14/2021)   Social Connection and Isolation Panel [NHANES]    Frequency of Communication with Friends  and Family: More than three times a week    Frequency of Social Gatherings with Friends and Family: Three times a week    Attends Religious Services: Never    Active Member of Clubs or Organizations: Yes    Attends Music therapist: More than 4 times per year    Marital Status: Divorced  Intimate Partner Violence: Not At Risk (07/14/2021)   Humiliation, Afraid, Rape, and Kick questionnaire    Fear of Current or Ex-Partner: No    Emotionally Abused: No    Physically Abused: No    Sexually Abused: No   Family Status  Relation Name Status   Mother  Deceased       complications from aneurysm surgery   Father  Deceased       heart and other issues   PGF  Deceased       unknown    Brother Scientist, research (medical)   Daughter  Alive   MGM  Deceased       unknown   MGF  Deceased       unknown   PGM  Deceased       unknown   Brother david Alive   Neg Hx  (Not Specified)   Family History  Problem Relation Age of Onset   Aneurysm Mother    Stroke Mother    Heart disease Mother    Heart disease Father 67   Hypertension Father    Lung disease Father    Heart disease Paternal Grandfather    Panic disorder Brother    Milk intolerance Daughter    Polycystic ovary syndrome Daughter    Obesity Brother    Hyperlipidemia Brother    Hypertension Brother    Diabetes Brother    Cancer Neg Hx    COPD Neg Hx    Breast cancer Neg Hx     Allergies  Allergen Reactions   Levofloxacin Hives   Morphine Nausea Only   Celecoxib Other (See Comments)    SOB   Celexa [Citalopram Hydrobromide]    Cymbalta [Duloxetine Hcl] Hives   Gabapentin Other (See Comments)    anger   Levaquin [Levofloxacin In D5w] Hives   Valium [Diazepam] Other (See Comments)    hallucinations   Monistat [Miconazole] Rash   Prednisone Anxiety      Review of Systems  Constitutional:  Negative for chills and fever.  Respiratory:  Negative for shortness of breath.   Cardiovascular:  Negative for chest pain.  Gastrointestinal:  Positive for heartburn. Negative for abdominal pain, nausea and vomiting.      Objective:     BP 130/80   Pulse 89   Temp (!) 97.5 F (36.4 C) (Oral)   Resp 16   Ht _0  (1.626 m)   Wt 190 lb 4.8 oz (86.3 kg)   SpO2 97%   BMI 32.66 kg/m  BP Readings from Last 3 Encounters:  12/16/21 130/80  05/05/21 122/78  02/23/21 132/80   Wt Readings from Last 3 Encounters:  12/16/21 190 lb 4.8 oz (86.3 kg)  05/05/21 192 lb 9.6 oz (87.4 kg)  02/23/21 190 lb 1.6 oz (86.2 kg)      Physical Exam Constitutional:      Appearance: Normal appearance.  HENT:     Head: Normocephalic and atraumatic.  Eyes:     Conjunctiva/sclera: Conjunctivae normal.  Cardiovascular:     Rate and Rhythm: Normal rate and regular rhythm.  Pulmonary:     Effort: Pulmonary effort  is normal.     Breath sounds: Normal breath sounds.  Musculoskeletal:        General: Tenderness present.     Right lower leg: No edema.     Left lower leg: No edema.     Comments: Lumbar pain to palpation bilaterally at the level of L4-L5, thoracolumbar scoliosis present   Skin:    General: Skin is warm and dry.  Neurological:     General: No focal deficit present.     Mental Status: She is alert. Mental status is at baseline.  Psychiatric:        Mood and Affect: Mood normal.        Behavior: Behavior normal.      No results found for any visits on  12/16/21.  Last CBC Lab Results  Component Value Date   WBC 7.1 11/25/2020   HGB 13.6 11/25/2020   HCT 40.2 11/25/2020   MCV 91 11/25/2020   MCH 30.7 11/25/2020   RDW 13.4 11/25/2020   PLT 142 (L) 05/69/7948   Last metabolic panel Lab Results  Component Value Date   GLUCOSE 79 11/25/2020   NA 140 11/25/2020   K 4.4 11/25/2020   CL 100 11/25/2020   CO2 24 11/25/2020   BUN 14 11/25/2020   CREATININE 0.81 11/25/2020   EGFR 80 11/25/2020   CALCIUM 9.4 11/25/2020   PROT 6.3 11/25/2020   ALBUMIN 4.2 11/25/2020   LABGLOB 2.1 11/25/2020   AGRATIO 2.0 11/25/2020   BILITOT 0.5 11/25/2020   ALKPHOS 79 11/25/2020   AST 13 11/25/2020   ALT 14 11/25/2020   Last lipids Lab Results  Component Value Date   CHOL 171 03/02/2021   HDL 67 03/02/2021   LDLCALC 87 03/02/2021   TRIG 91 03/02/2021   CHOLHDL 2.6 03/02/2021   Last hemoglobin A1c Lab Results  Component Value Date   HGBA1C 5.7 (H) 03/02/2021   Last thyroid functions Lab Results  Component Value Date   TSH 2.600 11/08/2016   Last vitamin D Lab Results  Component Value Date   VD25OH 34.1 01/09/2020   Last vitamin B12 and Folate Lab Results  Component Value Date   [id number] 707 11/11/2015      The 10-year ASCVD risk score (Arnett DK, et al., 2019) is: 6.2%    Assessment & Plan:   1. Gastroesophageal reflux disease with esophagitis without hemorrhage: Stable, refill Achiphex 20 mg daily.   - RABEprazole (ACIPHEX) 20 MG tablet; Take 1 tablet (20 mg total) by mouth daily.  Dispense: 90 tablet; Refill: 3  2. Current mild episode of major depressive disorder, unspecified whether recurrent ([place]): Stable, continue Wellbutrin XL 300 mg daily, refilled today.   - buPROPion (WELLBUTRIN XL) 150 MG 24 hr tablet; Take 1 tablet (150 mg total) by mouth 2 (two) times daily.  Dispense: 90 tablet; Refill: 1  3. Strain of lumbar region, initial encounter: Treat with scheduled anti-inflammatories and muscle relaxer as  needed. Continue heat, topical anti-inflammatories, rest and gentle stretching.   - meloxicam (MOBIC) 15 MG tablet; Take 1 tablet (15 mg total) by mouth daily.  Dispense: 30 tablet; Refill: 0 - tiZANidine (ZANAFLEX) 4 MG tablet; Take 1 tablet (4 mg total) by mouth every 6 (six) hours as needed for muscle spasms.  Dispense: 30 tablet; Refill: 0   Return for already scheduled .    Teodora Medici, DO

## 2021-12-16 NOTE — Patient Instructions (Addendum)
It was great seeing you today!  Plan discussed at today's visit: -Medications refilled -For low back, be sure to rest, use gentle stretches. Can take Mobic as needed and muscle relaxer but take at night because it may make you sleepy   Follow up in: in November, already scheduled   Take care and let Olivia Morgan know if you have any questions or concerns prior to your next visit.  Dr. Rosana Berger   Lumbar Strain A lumbar strain, which is sometimes called a low-back strain, is a stretch or tear in a muscle or the strong cords of tissue that attach muscle to bone (tendons) in the lower back (lumbar spine). This type of injury occurs when muscles or tendons are torn or are stretched beyond their limits. Lumbar strains can range from mild to severe. Mild strains may involve stretching a muscle or tendon without tearing it. These may heal in 1-2 weeks. More severe strains involve tearing of muscle fibers or tendons. These will cause more pain and may take 6-8 weeks to heal. What are the causes? This condition may be caused by: Trauma, such as a fall or a hit to the body. Twisting or overstretching the back. This may result from doing activities that need a lot of energy, such as lifting heavy objects. What increases the risk? This injury is more common in: Athletes. People with obesity. People who do repeated lifting, bending, or other movements that involve their back. What are the signs or symptoms? Symptoms of this condition may include: Sharp or dull pain in the lower back that does not go away. The pain may extend to the buttocks. Stiffness or limited range of motion. Sudden muscle tightening (spasms). How is this diagnosed? This condition may be diagnosed based on: Your symptoms. Your medical history. A physical exam. Imaging tests, such as: X-rays. MRI. How is this treated? Treatment for this condition may include: Rest. Applying heat and cold to the affected area. Over-the-counter  medicines to help relieve pain and inflammation, such as NSAIDs. Prescription pain medicine and muscle relaxants may be needed for a short time. Physical therapy. Follow these instructions at home: Managing pain, stiffness, and swelling     If directed, put ice on the injured area during the first 24 hours after your injury. Put ice in a plastic bag. Place a towel between your skin and the bag. Leave the ice on for 20 minutes, 2-3 times a day. If directed, apply heat to the affected area as often as told by your health care provider. Use the heat source that your health care provider recommends, such as a moist heat pack or a heating pad. Place a towel between your skin and the heat source. Leave the heat on for 20-30 minutes. Remove the heat if your skin turns bright red. This is especially important if you are unable to feel pain, heat, or cold. You may have a greater risk of getting burned. Activity Rest and return to your normal activities as told by your health care provider. Ask your health care provider what activities are safe for you. Do exercises as told by your health care provider. Medicines Take over-the-counter and prescription medicines only as told by your health care provider. Ask your health care provider if the medicine prescribed to you: Requires you to avoid driving or using heavy machinery. Can cause constipation. You may need to take these actions to prevent or treat constipation: Drink enough fluid to keep your urine pale yellow. Take over-the-counter or  prescription medicines. Eat foods that are high in fiber, such as beans, whole grains, and fresh fruits and vegetables. Limit foods that are high in fat and processed sugars, such as fried or sweet foods. Injury prevention To prevent a future low-back injury: Always warm up properly before physical activity or sports. Cool down and stretch after being active. Use correct form when playing sports and lifting  heavy objects. Bend your knees before you lift heavy objects. Use good posture when sitting and standing. Stay physically fit and keep a healthy weight. Do at least 150 minutes of moderate-intensity exercise each week, such as brisk walking or water aerobics. Do strength exercises at least 2 times each week.  General instructions Do not use any products that contain nicotine or tobacco, such as cigarettes, e-cigarettes, and chewing tobacco. If you need help quitting, ask your health care provider. Keep all follow-up visits as told by your health care provider. This is important. Contact a health care provider if: Your back pain does not improve after 6 weeks of treatment. Your symptoms get worse. Get help right away if: Your back pain is severe. You are unable to stand or walk. You develop pain in your legs. You develop weakness in your buttocks or legs. You have difficulty controlling when you urinate or when you have a bowel movement. You have frequent, painful, or bloody urination. You have a temperature over 101.12F (38.3C) Summary A lumbar strain, which is sometimes called a low-back strain, is a stretch or tear in a muscle or the strong cords of tissue that attach muscle to bone (tendons) in the lower back (lumbar spine). This type of injury occurs when muscles or tendons are torn or are stretched beyond their limits. Rest and return to your normal activities as told by your health care provider. If directed, apply heat and ice to the affected area as often as told by your health care provider. Take over-the-counter and prescription medicines only as told by your health care provider. Contact a health care provider if you have new or worsening symptoms. This information is not intended to replace advice given to you by your health care provider. Make sure you discuss any questions you have with your health care provider. Document Revised: 07/04/2021 Document Reviewed:  01/27/2021 Elsevier Patient Education  [facility] or Strain Rehab Ask your health care provider which exercises are safe for you. Do exercises exactly as told by your health care provider and adjust them as directed. It is normal to feel mild stretching, pulling, tightness, or discomfort as you do these exercises. Stop right away if you feel sudden pain or your pain gets worse. Do not begin these exercises until told by your health care provider. Stretching and range-of-motion exercises These exercises warm up your muscles and joints and improve the movement and flexibility of your back. These exercises also help to relieve pain, numbness, and tingling. Lumbar rotation  Lie on your back on a firm bed or the floor with your knees bent. Straighten your arms out to your sides so each arm forms a 90-degree angle (right angle) with a side of your body. Slowly move (rotate) both of your knees to one side of your body until you feel a stretch in your lower back (lumbar). Try not to let your shoulders lift off the floor. Hold this position for __________ seconds. Tense your abdominal muscles and slowly move your knees back to the starting position. Repeat this exercise on  the other side of your body. Repeat __________ times. Complete this exercise __________ times a day. Single knee to chest  Lie on your back on a firm bed or the floor with both legs straight. Bend one of your knees. Use your hands to move your knee up toward your chest until you feel a gentle stretch in your lower back and buttock. Hold your leg in this position by holding on to the front of your knee. Keep your other leg as straight as possible. Hold this position for __________ seconds. Slowly return to the starting position. Repeat with your other leg. Repeat __________ times. Complete this exercise __________ times a day. Prone extension on elbows  Lie on your abdomen on a firm bed or the floor  (prone position). Prop yourself up on your elbows. Use your arms to help lift your chest up until you feel a gentle stretch in your abdomen and your lower back. This will place some of your body weight on your elbows. If this is uncomfortable, try stacking pillows under your chest. Your hips should stay down, against the surface that you are lying on. Keep your hip and back muscles relaxed. Hold this position for __________ seconds. Slowly relax your upper body and return to the starting position. Repeat __________ times. Complete this exercise __________ times a day. Strengthening exercises These exercises build strength and endurance in your back. Endurance is the ability to use your muscles for a long time, even after they get tired. Pelvic tilt This exercise strengthens the muscles that lie deep in the abdomen. Lie on your back on a firm bed or the floor with your legs extended. Bend your knees so they are pointing toward the ceiling and your feet are flat on the floor. Tighten your lower abdominal muscles to press your lower back against the floor. This motion will tilt your pelvis so your tailbone points up toward the ceiling instead of pointing to your feet or the floor. To help with this exercise, you may place a small towel under your lower back and try to push your back into the towel. Hold this position for __________ seconds. Let your muscles relax completely before you repeat this exercise. Repeat __________ times. Complete this exercise __________ times a day. Alternating arm and leg raises  Get on your hands and knees on a firm surface. If you are on a hard floor, you may want to use padding, such as an exercise mat, to cushion your knees. Line up your arms and legs. Your hands should be directly below your shoulders, and your knees should be directly below your hips. Lift your left leg behind you. At the same time, raise your right arm and straighten it in front of you. Do not  lift your leg higher than your hip. Do not lift your arm higher than your shoulder. Keep your abdominal and back muscles tight. Keep your hips facing the ground. Do not arch your back. Keep your balance carefully, and do not hold your breath. Hold this position for __________ seconds. Slowly return to the starting position. Repeat with your right leg and your left arm. Repeat __________ times. Complete this exercise __________ times a day. Abdominal set with straight leg raise  Lie on your back on a firm bed or the floor. Bend one of your knees and keep your other leg straight. Tense your abdominal muscles and lift your straight leg up, 4-6 inches (10-15 cm) off the ground. Keep your abdominal muscles tight  and hold this position for __________ seconds. Do not hold your breath. Do not arch your back. Keep it flat against the ground. Keep your abdominal muscles tense as you slowly lower your leg back to the starting position. Repeat with your other leg. Repeat __________ times. Complete this exercise __________ times a day. Single leg lower with bent knees Lie on your back on a firm bed or the floor. Tense your abdominal muscles and lift your feet off the floor, one foot at a time, so your knees and hips are bent in 90-degree angles (right angles). Your knees should be over your hips and your lower legs should be parallel to the floor. Keeping your abdominal muscles tense and your knee bent, slowly lower one of your legs so your toe touches the ground. Lift your leg back up to return to the starting position. Do not hold your breath. Do not let your back arch. Keep your back flat against the ground. Repeat with your other leg. Repeat __________ times. Complete this exercise __________ times a day. Posture and body mechanics Good posture and healthy body mechanics can help to relieve stress in your body's tissues and joints. Body mechanics refers to the movements and positions of your  body while you do your daily activities. Posture is part of body mechanics. Good posture means: Your spine is in its natural S-curve position (neutral). Your shoulders are pulled back slightly. Your head is not tipped forward (neutral). Follow these guidelines to improve your posture and body mechanics in your everyday activities. Standing  When standing, keep your spine neutral and your feet about hip-width apart. Keep a slight bend in your knees. Your ears, shoulders, and hips should line up. When you do a task in which you stand in one place for a long time, place one foot up on a stable object that is 2-4 inches (5-10 cm) high, such as a footstool. This helps keep your spine neutral. Sitting  When sitting, keep your spine neutral and keep your feet flat on the floor. Use a footrest, if necessary, and keep your thighs parallel to the floor. Avoid rounding your shoulders, and avoid tilting your head forward. When working at a desk or a computer, keep your desk at a height where your hands are slightly lower than your elbows. Slide your chair under your desk so you are close enough to maintain good posture. When working at a computer, place your monitor at a height where you are looking straight ahead and you do not have to tilt your head forward or downward to look at the screen. Resting When lying down and resting, avoid positions that are most painful for you. If you have pain with activities such as sitting, bending, stooping, or squatting, lie in a position in which your body does not bend very much. For example, avoid curling up on your side with your arms and knees near your chest (fetal position). If you have pain with activities such as standing for a long time or reaching with your arms, lie with your spine in a neutral position and bend your knees slightly. Try the following positions: Lying on your side with a pillow between your knees. Lying on your back with a pillow under your  knees. Lifting  When lifting objects, keep your feet at least shoulder-width apart and tighten your abdominal muscles. Bend your knees and hips and keep your spine neutral. It is important to lift using the strength of your legs, not your  back. Do not lock your knees straight out. Always ask for help to lift heavy or awkward objects. This information is not intended to replace advice given to you by your health care provider. Make sure you discuss any questions you have with your health care provider. Document Revised: 06/14/2020 Document Review

## 2021-12-20 ENCOUNTER — Ambulatory Visit: Payer: Medicare Other | Admitting: Family Medicine

## 2021-12-21 DIAGNOSIS — Z96652 Presence of left artificial knee joint: Secondary | ICD-10-CM | POA: Diagnosis not present

## 2021-12-31 DIAGNOSIS — B372 Candidiasis of skin and nail: Secondary | ICD-10-CM | POA: Diagnosis not present

## 2022-01-24 ENCOUNTER — Other Ambulatory Visit: Payer: Self-pay | Admitting: Internal Medicine

## 2022-01-24 DIAGNOSIS — S39012A Strain of muscle, fascia and tendon of lower back, initial encounter: Secondary | ICD-10-CM

## 2022-01-24 NOTE — Telephone Encounter (Signed)
Requested medication (s) are due for refill today: yes  Requested medication (s) are on the active medication list:yes  Last refill:  12/16/21  Future visit scheduled: yes  Notes to clinic:  Unable to refill per protocol, cannot delegate.      Requested Prescriptions  Pending Prescriptions Disp Refills   tiZANidine (ZANAFLEX) 4 MG tablet [Pharmacy Med Name: TIZANIDINE 4MG  TABLETS] 30 tablet 0    Sig: TAKE 1 TABLET(4 MG) BY MOUTH EVERY 6 HOURS AS NEEDED FOR MUSCLE SPASMS     Not Delegated - Cardiovascular:  Alpha-2 Agonists - tizanidine Failed - 01/24/2022  1:48 PM      Failed - This refill cannot be delegated      Passed - Valid encounter within last 6 months    Recent Outpatient Visits           1 month ago Gastroesophageal reflux disease with esophagitis without hemorrhage   [place], DO   8 months ago Vaginal itching   [place], FNP   11 months ago Estrogen deficiency   [place], DO   1 year ago Pain of upper abdomen   [facility] Delsa Grana, PA-C   1 year ago Non-seasonal allergic rhinitis, unspecified trigger   [facility] Just, Laurita Quint, FNP       Future Appointments             In 1 month Delsa Grana, PA-C [facility], [facility]

## 2022-02-12 DIAGNOSIS — Z03818 Encounter for observation for suspected exposure to other biological agents ruled out: Secondary | ICD-10-CM | POA: Diagnosis not present

## 2022-02-12 DIAGNOSIS — J069 Acute upper respiratory infection, unspecified: Secondary | ICD-10-CM | POA: Diagnosis not present

## 2022-02-12 DIAGNOSIS — R051 Acute cough: Secondary | ICD-10-CM | POA: Diagnosis not present

## 2022-02-23 ENCOUNTER — Other Ambulatory Visit: Payer: Self-pay | Admitting: Internal Medicine

## 2022-02-23 DIAGNOSIS — S39012A Strain of muscle, fascia and tendon of lower back, initial encounter: Secondary | ICD-10-CM

## 2022-02-23 NOTE — Patient Instructions (Addendum)
Health Maintenance  Topic Date Due   Mammogram  01/11/2022   COVID-19 Vaccine (3 - Moderna series) 03/12/2022*   Flu Shot  07/09/2022*   Medicare Annual Wellness Visit  07/15/2022   DEXA scan (bone density measurement)  10/05/2023   Colon Cancer Screening  11/26/2028   Pneumonia Vaccine  Completed   Hepatitis C Screening: USPSTF Recommendation to screen - Ages 18-67 yo.  Completed   Zoster (Shingles) Vaccine  Completed   HPV Vaccine  Aged Out  *Topic was postponed. The date shown is not the original due date.   Recommend continuing Vit D 3 1000-2000 IU daily and Calcium 1200 mg in diet/supplements daily and continued weight bearing exercise as tolerated, light weight training can also help keep bones strong  Call and schedule your mammogram: [facility] [id number], [place], [place] [address] Scheduling phone #: [phone]   Preventive Care 67 Years and Older, Female Preventive care refers to lifestyle choices and visits with your health care provider that can promote health and wellness. Preventive care visits are also called wellness exams. What can I expect for my preventive care visit? Counseling Your health care provider may ask you questions about your: Medical history, including: Past medical problems. Family medical history. Pregnancy and menstrual history. History of falls. Current health, including: Memory and ability to understand (cognition). Emotional well-being. Home life and relationship well-being. Sexual activity and sexual health. Lifestyle, including: Alcohol, nicotine or tobacco, and drug use. Access to firearms. Diet, exercise, and sleep habits. Work and work Statistician. Sunscreen use. Safety issues such as seatbelt and bike helmet use. Physical exam Your health care provider will check your: Height and weight. These may be used to calculate your BMI (body mass index). BMI is a measurement that tells if  you are at a healthy weight. Waist circumference. This measures the distance around your waistline. This measurement also tells if you are at a healthy weight and may help predict your risk of certain diseases, such as type 2 diabetes and high blood pressure. Heart rate and blood pressure. Body temperature. Skin for abnormal spots. What immunizations do I need?  Vaccines are usually given at various ages, according to a schedule. Your health care provider will recommend vaccines for you based on your age, medical history, and lifestyle or other factors, such as travel or where you work. What tests do I need? Screening Your health care provider may recommend screening tests for certain conditions. This may include: Lipid and cholesterol levels. Hepatitis C test. Hepatitis B test. HIV (human immunodeficiency virus) test. STI (sexually transmitted infection) testing, if you are at risk. Lung cancer screening. Colorectal cancer screening. Diabetes screening. This is done by checking your blood sugar (glucose) after you have not eaten for a while (fasting). Mammogram. Talk with your health care provider about how often you should have regular mammograms. BRCA-related cancer screening. This may be done if you have a family history of breast, ovarian, tubal, or peritoneal cancers. Bone density scan. This is done to screen for osteoporosis. Talk with your health care provider about your test results, treatment options, and if necessary, the need for more tests. Follow these instructions at home: Eating and drinking  Eat a diet that includes fresh fruits and vegetables, whole grains, lean protein, and low-fat dairy products. Limit your intake of foods with high amounts of sugar, saturated fats, and salt. Take vitamin and mineral supplements as recommended by your health care provider. Do  not drink alcohol if your health care provider tells you not to drink. If you drink alcohol: Limit how much  you have to 0-1 drink a day. Know how much alcohol is in your drink. In the [place] one drink equals one 12 oz bottle of beer (355 mL), one 5 oz glass of wine (148 mL), or one 1 oz glass of hard liquor (44 mL). Lifestyle Brush your teeth every morning and night with fluoride toothpaste. Floss one time each day. Exercise for at least 30 minutes 5 or more days each week. Do not use any products that contain nicotine or tobacco. These products include cigarettes, chewing tobacco, and vaping devices, such as e-cigarettes. If you need help quitting, ask your health care provider. Do not use drugs. If you are sexually active, practice safe sex. Use a condom or other form of protection in order to prevent STIs. Take aspirin only as told by your health care provider. Make sure that you understand how much to take and what form to take. Work with your health care provider to find out whether it is safe and beneficial for you to take aspirin daily. Ask your health care provider if you need to take a cholesterol-lowering medicine (statin). Find healthy ways to manage stress, such as: Meditation, yoga, or listening to music. Journaling. Talking to a trusted person. Spending time with friends and family. Minimize exposure to UV radiation to reduce your risk of skin cancer. Safety Always wear your seat belt while driving or riding in a vehicle. Do not drive: If you have been drinking alcohol. Do not ride with someone who has been drinking. When you are tired or distracted. While texting. If you have been using any mind-altering substances or drugs. Wear a helmet and other protective equipment during sports activities. If you have firearms in your house, make sure you follow all gun safety procedures. What's next? Visit your health care provider once a year for an annual wellness visit. Ask your health care provider how often you should have your eyes and teeth checked. Stay up to date on all  vaccines. This information is not intended to replace advice given to you by your health care provider. Make sure you discuss any questions you have with your health care provider. Document Revised: 09/22/2020 Document Reviewed: 09/22/2020 Elsevier Patient Education  [place].

## 2022-02-24 ENCOUNTER — Ambulatory Visit (INDEPENDENT_AMBULATORY_CARE_PROVIDER_SITE_OTHER): Payer: Medicare Other | Admitting: Family Medicine

## 2022-02-24 ENCOUNTER — Encounter: Payer: Self-pay | Admitting: Family Medicine

## 2022-02-24 VITALS — BP 126/74 | HR 96 | Temp 97.7°F | Resp 16 | Ht 64.0 in | Wt 191.2 lb

## 2022-02-24 DIAGNOSIS — Z760 Encounter for issue of repeat prescription: Secondary | ICD-10-CM

## 2022-02-24 DIAGNOSIS — Z6832 Body mass index (BMI) 32.0-32.9, adult: Secondary | ICD-10-CM

## 2022-02-24 DIAGNOSIS — Z1231 Encounter for screening mammogram for malignant neoplasm of breast: Secondary | ICD-10-CM

## 2022-02-24 DIAGNOSIS — Z23 Encounter for immunization: Secondary | ICD-10-CM

## 2022-02-24 DIAGNOSIS — E669 Obesity, unspecified: Secondary | ICD-10-CM | POA: Diagnosis not present

## 2022-02-24 DIAGNOSIS — Z0000 Encounter for general adult medical examination without abnormal findings: Secondary | ICD-10-CM | POA: Diagnosis not present

## 2022-02-24 MED ORDER — MELOXICAM 15 MG PO TABS: 7.5000 mg | ORAL_TABLET | Freq: Every day | ORAL | 5 refills | Status: DC | PRN

## 2022-02-24 MED ORDER — MELOXICAM 15 MG PO TABS: 15.0000 mg | ORAL_TABLET | Freq: Every day | ORAL | 0 refills | Status: DC

## 2022-02-24 NOTE — Progress Notes (Signed)
Patient: Olivia Morgan, Female    DOB: 1955-04-15, 66 y.o.   MRN: [id number] Delsa Grana, PA-C Visit Date: 02/24/2022  Today's Provider: Delsa Grana, PA-C   Chief Complaint  Patient presents with   Annual Exam   Subjective:   Annual physical exam:  Olivia Morgan is a 66 y.o. female who presents today for complete physical exam:  Exercise/Activity:   limited with left knee Diet/nutrition:  tries to be healthy Sleep: poor sleep for years  [place]: No Food Insecurity (02/24/2022)  Housing: Low Risk  (02/24/2022)  Transportation Needs: No Transportation Needs (02/24/2022)  Utilities: Not At Risk (02/24/2022)  Alcohol Screen: Low Risk  (02/24/2022)  Depression (PHQ2-9): Low Risk  (02/24/2022)  Financial Resource Strain: Low Risk  (02/24/2022)  Physical Activity: Insufficiently Active (02/24/2022)  Social Connections: Moderately Isolated (02/24/2022)  Stress: No Stress Concern Present (02/24/2022)  Tobacco Use: Low Risk  (02/24/2022)      USPSTF grade A and B recommendations - reviewed and addressed today  Depression:  Phq 9 completed today by patient, was reviewed by me with patient in the room PHQ score is neg, pt feels good    02/24/2022   10:39 AM 12/16/2021   10:50 AM 07/14/2021    9:30 AM 05/05/2021    2:12 PM  PHQ 2/9 Scores  PHQ - 2 Score 0 0 0 0  PHQ- 9 Score 0 0  0      02/24/2022   10:39 AM 12/16/2021   10:50 AM 07/14/2021    9:30 AM 05/05/2021    2:12 PM 02/23/2021    8:46 AM  Depression screen PHQ 2/9  Decreased Interest 0 0 0 0 0  Down, Depressed, Hopeless 0 0 0 0 0  PHQ - 2 Score 0 0 0 0 0  Altered sleeping 0 0  0 0  Tired, decreased energy 0 0  0 0  Change in appetite 0 0  0 0  Feeling bad or failure about yourself  0 0  0 0  Trouble concentrating 0 0  0 0  Moving slowly or fidgety/restless 0 0  0 0  Suicidal thoughts 0 0  0 0  PHQ-9 Score 0 0  0 0  Difficult doing work/chores Not difficult at all Not  difficult at all  Not difficult at all Not difficult at all    Alcohol screening: [place] Office Visit [facility]/24/20[facility]  AUDIT-C Score 1       Immunizations and Health Maintenance: Health Maintenance  Topic Date Due   MAMMOGRAM  01/11/2022   COVID-19 Vaccine (3 - Moderna series) 03/12/2022 (Originally 09/27/2019)   INFLUENZA VACCINE  07/09/2022 (Originally 11/08/2021)   Medicare Annual Wellness (AWV)  07/15/2022   DEXA SCAN  10/05/2023   COLONOSCOPY (Pts 45-20yr Insurance coverage will need to be confirmed)  11/26/2028   Pneumonia Vaccine 66 Years old  Completed   Hepatitis C Screening  Completed   Zoster Vaccines- Shingrix  Completed   HPV VACCINES  Aged Out     Hep C Screening: done  STD testing and prevention (HIV/chl/gon/syphilis):  see above, no additional testing desired by pt today  Intimate partner violence:  safe, sees a guy, feels safe  Sexual History/Pain during Intercourse: Divorced, daughter  Menstrual History/LMP/Abnormal Bleeding: none No LMP recorded. Patient is postmenopausal.  Incontinence Symptoms:   none  Breast cancer: due Last Mammogram: *see HM list above BRCA gene screening:  Cervical cancer screening: done Pt denies family hx of cancers - breast, ovarian, uterine, colon:     Osteoporosis:   Discussion on osteoporosis per age, including high calcium and vitamin D supplementation, weight bearing exercises Pt is supplementing with daily calcium/Vit D. Reviewed from June 2023 this year Bone scan/dexa   Skin cancer:  Hx of skin CA -  NO Discussed atypical lesions   Colorectal cancer:   Colonoscopy is UTD Discussed concerning signs and sx of CRC, pt denies abd pain, blood in stool, change in bowels or uncontrolled GERD sx  Lung cancer:   Low Dose CT Chest recommended if Age 49-80 years, 20 pack-year currently smoking OR have quit w/in 15years. Patient does not qualify.    Social History    Tobacco Use   Smoking status: Never   Smokeless tobacco: Never  Vaping Use   Vaping Use: Never used  Substance Use Topics   Alcohol use: Yes    Comment: socially   Drug use: No     Flowsheet Row Office Visit from 02/24/2022 in [facility]  AUDIT-C Score 1       Family History  Problem Relation Age of Onset   Aneurysm Mother    Stroke Mother    Heart disease Mother    Heart disease Father 79   Hypertension Father    Lung disease Father    Heart disease Paternal Grandfather    Panic disorder Brother    Milk intolerance Daughter    Polycystic ovary syndrome Daughter    Obesity Brother    Hyperlipidemia Brother    Hypertension Brother    Diabetes Brother    Cancer Neg Hx    COPD Neg Hx    Breast cancer Neg Hx      Blood pressure/Hypertension: BP Readings from Last 3 Encounters:  02/24/22 126/74  12/16/21 130/80  05/05/21 122/78    Weight/Obesity: Wt Readings from Last 3 Encounters:  02/24/22 191 lb 3.2 oz (86.7 kg)  12/16/21 190 lb 4.8 oz (86.3 kg)  05/05/21 192 lb 9.6 oz (87.4 kg)   BMI Readings from Last 3 Encounters:  02/24/22 32.82 kg/m  12/16/21 32.66 kg/m  05/05/21 33.06 kg/m     Lipids:  Lab Results  Component Value Date   CHOL 171 03/02/2021   CHOL 187 01/09/2020   CHOL 153 11/22/2017   Lab Results  Component Value Date   HDL 67 03/02/2021   HDL 72 01/09/2020   HDL 71 11/22/2017   Lab Results  Component Value Date   LDLCALC 87 03/02/2021   LDLCALC 98 01/09/2020   LDLCALC 68 11/22/2017   Lab Results  Component Value Date   TRIG 91 03/02/2021   TRIG 95 01/09/2020   TRIG 70 11/22/2017   Lab Results  Component Value Date   CHOLHDL 2.6 03/02/2021   CHOLHDL 2.6 01/09/2020   CHOLHDL 2.2 11/22/2017   No results found for: "LDLDIRECT" Based on the results of lipid panel his/her cardiovascular risk factor ( using [place] )  in the next 10 years is: The 10-year ASCVD risk score (Arnett DK, et al., 2019)  is: 5.9%   Values used to calculate the score:     Age: 24 years     Sex: Female     Is Non-Hispanic African American: No     Diabetic: No     Tobacco smoker: No     Systolic Blood Pressure: 073 mmHg     Is BP treated: No  HDL Cholesterol: 67 mg/dL     Total Cholesterol: 171 mg/dL  Glucose:  Glucose  Date Value Ref Range Status  11/25/2020 79 65 - 99 mg/dL Final  01/09/2020 95 65 - 99 mg/dL Final  11/13/2018 111 (H) 65 - 99 mg/dL Final    Advanced Care Planning:  A voluntary discussion about advance care planning including the explanation and discussion of advance directives.   Discussed health care proxy and Living will, and the patient was able to identify a health care proxy as maybe brother and secondary daughter.   Patient does not have a living will at present time.   Social History       Social History   Socioeconomic History   Marital status: Divorced    Spouse name: Not on file   Number of children: 1   Years of education: Not on file   Highest education level: Not on file  Occupational History   Not on file  Tobacco Use   Smoking status: Never   Smokeless tobacco: Never  Vaping Use   Vaping Use: Never used  Substance and Sexual Activity   Alcohol use: Yes    Comment: socially   Drug use: No   Sexual activity: Yes    Birth control/protection: Post-menopausal  Other Topics Concern   Not on file  Social History Narrative   Not on file   Social Determinants of Health   Financial Resource Strain: Low Risk  (02/24/2022)   Overall Financial Resource Strain (CARDIA)    Difficulty of Paying Living Expenses: Not hard at all  Food Insecurity: No Food Insecurity (02/24/2022)   Hunger Vital Sign    Worried About Running Out of Food in the Last Year: Never true    [place] [employer] in the Last Year: Never true  Transportation Needs: No Transportation Needs (02/24/2022)   PRAPARE - Hydrologist (Medical): No    Lack of  Transportation (Non-Medical): No  Physical Activity: Insufficiently Active (02/24/2022)   Exercise Vital Sign    Days of Exercise per Week: 2 days    Minutes of Exercise per Session: 20 min  Stress: No Stress Concern Present (02/24/2022)   [place]    Feeling of Stress : Only a little  Social Connections: Moderately Isolated (02/24/2022)   Social Connection and Isolation Panel [NHANES]    Frequency of Communication with Friends and Family: More than three times a week    Frequency of Social Gatherings with Friends and Family: More than three times a week    Attends Religious Services: Never    Marine scientist or Organizations: Yes    Attends Music therapist: More than 4 times per year    Marital Status: Divorced    Family History        Family History  Problem Relation Age of Onset   Aneurysm Mother    Stroke Mother    Heart disease Mother    Heart disease Father 74   Hypertension Father    Lung disease Father    Heart disease Paternal Grandfather    Panic disorder Brother    Milk intolerance Daughter    Polycystic ovary syndrome Daughter    Obesity Brother    Hyperlipidemia Brother    Hypertension Brother    Diabetes Brother    Cancer Neg Hx    COPD Neg Hx    Breast cancer Neg Hx  Patient Active Problem List   Diagnosis Date Noted   Rectal bleeding    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   History of total hip replacement 03/05/2017   Dermatitis 02/12/2017   Allergic rhinitis 04/16/2016   Presence of right artificial knee joint 12/16/2015   Obesity (BMI 30.0-34.9) 11/18/2015   Vitamin D deficiency 11/11/2015   Mass of left lower leg 09/07/2015   Adverse drug reaction 06/13/2015   Greater trochanteric bursitis of left hip 06/05/2015   Hammer toe of right foot 06/05/2015   Neuropathy of both feet 06/05/2015   Chronic kidney disease, stage  II (mild) 04/05/2015   Lower back pain 03/10/2015   OA (osteoarthritis) of hip    GERD (gastroesophageal reflux disease)    Plantar fasciitis    Depression    Genital herpes    Bilateral hearing loss    Abnormal ECG 09/09/2014   Hip pain 09/09/2012   Other mechanical complication of other internal orthopedic device, implant, and graft 09/09/2012   Medial collateral ligament sprain of knee 11/13/2011   Other and unspecified derangement of medial meniscus 06/13/2011   Primary localized osteoarthrosis, lower leg 06/13/2011    Past Surgical History:  Procedure Laterality Date   CESAREAN SECTION     COLONOSCOPY  2010   COLONOSCOPY WITH PROPOFOL N/A 11/27/2018   Procedure: COLONOSCOPY WITH PROPOFOL;  Surgeon: Lin Landsman, MD;  Location: [place];  Service: Endoscopy;  Laterality: N/A;   ENDOSCOPIC CONCHA BULLOSA RESECTION Left 05/23/2018   Procedure: ENDOSCOPIC CONCHA BULLOSA RESECTION;  Surgeon: Margaretha Sheffield, MD;  Location: [place];  Service: ENT;  Laterality: Left;   FOOT SURGERY Right    REPLACEMENT TOTAL KNEE  3/13   Dr.Hallows at [place] Bilateral 05/23/2018   Procedure: SEPTOPLASTY;  Surgeon: Margaretha Sheffield, MD;  Location: [place];  Service: ENT;  Laterality: Bilateral;   TOTAL HIP ARTHROPLASTY Left 10/2008   TURBINATE REDUCTION Bilateral 05/23/2018   Procedure: INFERIOR TURBINATE REDUCTION;  Surgeon: Margaretha Sheffield, MD;  Location: [place];  Service: ENT;  Laterality: Bilateral;   UPPER GI ENDOSCOPY  1992     Current Outpatient Medications:    azelastine (ASTELIN) 0.1 % nasal spray, Place into the nose., Disp: , Rfl:    buPROPion (WELLBUTRIN XL) 150 MG 24 hr tablet, Take 1 tablet (150 mg total) by mouth 2 (two) times daily., Disp: 90 tablet, Rfl: 1   cholecalciferol (VITAMIN D3) 25 MCG (1000 UNIT) tablet, Take 1,000 Units by mouth daily., Disp: , Rfl:    diphenhydrAMINE (BENADRYL) 25 MG tablet, Take 25 mg  by mouth every 6 (six) hours as needed., Disp: , Rfl:    meloxicam (MOBIC) 15 MG tablet, Take 1 tablet (15 mg total) by mouth daily., Disp: 30 tablet, Rfl: 0   Multiple Vitamins-Minerals (HAIR SKIN AND NAILS FORMULA PO), Take by mouth., Disp: , Rfl:    RABEprazole (ACIPHEX) 20 MG tablet, Take 1 tablet (20 mg total) by mouth daily., Disp: 90 tablet, Rfl: 3   rizatriptan (MAXALT) 5 MG tablet, Take 1 tablet (5 mg total) by mouth as needed for migraine. May repeat in 2 hours if needed, Disp: 10 tablet, Rfl: 0   tiZANidine (ZANAFLEX) 4 MG tablet, TAKE 1 TABLET(4 MG) BY MOUTH EVERY 6 HOURS AS NEEDED FOR MUSCLE SPASMS, Disp: 30 tablet, Rfl: 0   valACYclovir (VALTREX) 500 MG tablet, Take 1 tablet (500 mg total) by mouth daily., Disp: 90 tablet, Rfl:  1   vitamin E 180 MG (400 UNITS) capsule, Take 400 Units by mouth daily., Disp: , Rfl:   Allergies  Allergen Reactions   Levofloxacin Hives   Morphine Nausea Only   Celecoxib Other (See Comments)    SOB   Celexa [Citalopram Hydrobromide]    Cymbalta [Duloxetine Hcl] Hives   Gabapentin Other (See Comments)    anger   Levaquin [Levofloxacin In D5w] Hives   Valium [Diazepam] Other (See Comments)    hallucinations   Monistat [Miconazole] Rash   Prednisone Anxiety    Patient Care Team: Delsa Grana, PA-C as PCP - General (Family Medicine) Hallows, Rhett K, MD (Orthopedic Surgery)   Chart Review: I personally reviewed active problem list, medication list, allergies, family history, social history, health maintenance, notes from last encounter, lab results, imaging with the patient/caregiver today.   Review of Systems  Constitutional: Negative.   HENT: Negative.    Eyes: Negative.   Respiratory: Negative.    Cardiovascular: Negative.   Gastrointestinal: Negative.   Endocrine: Negative.   Genitourinary: Negative.   Musculoskeletal: Negative.   Skin: Negative.   Allergic/Immunologic: Negative.   Neurological: Negative.   Hematological:  Negative.   Psychiatric/Behavioral: Negative.    All other systems reviewed and are negative.         Objective:   Vitals:  Vitals:   02/24/22 1049  BP: 126/74  Pulse: 96  Resp: 16  Temp: 97.7 F (36.5 C)  TempSrc: Oral  SpO2: 99%  Weight: 191 lb 3.2 oz (86.7 kg)  Height: _0  (1.626 m)    Body mass index is 32.82 kg/m.  Physical Exam Vitals and nursing note reviewed.  Constitutional:      General: She is not in acute distress.    Appearance: Normal appearance. She is well-developed. She is obese. She is not ill-appearing, toxic-appearing or diaphoretic.  HENT:     Head: Normocephalic and atraumatic.     Right Ear: External ear normal.     Left Ear: External ear normal.     Nose: Nose normal. No congestion or rhinorrhea.     Mouth/Throat:     Mouth: Mucous membranes are moist.     Pharynx: Oropharynx is clear. Uvula midline. No oropharyngeal exudate or posterior oropharyngeal erythema.  Eyes:     General: Lids are normal. No scleral icterus.       Right eye: No discharge.        Left eye: No discharge.     Conjunctiva/sclera: Conjunctivae normal.  Neck:     Trachea: Phonation normal. No tracheal deviation.  Cardiovascular:     Rate and Rhythm: Normal rate and regular rhythm.     Pulses: Normal pulses.          Radial pulses are 2+ on the right side and 2+ on the left side.       Posterior tibial pulses are 2+ on the right side and 2+ on the left side.     Heart sounds: Normal heart sounds. No murmur heard.    No friction rub. No gallop.  Pulmonary:     Effort: Pulmonary effort is normal. No respiratory distress.     Breath sounds: Normal breath sounds. No stridor. No wheezing, rhonchi or rales.  Chest:     Chest wall: No tenderness.  Abdominal:     General: Bowel sounds are normal. There is no distension.     Palpations: Abdomen is soft.     Tenderness: There is no abdominal  tenderness. There is no guarding or rebound.  Musculoskeletal:        General:  No deformity.     Cervical back: Normal range of motion and neck supple.     Right lower leg: No edema.     Left lower leg: No edema.     Comments: Scoliosis, thoracic  Lymphadenopathy:     Cervical: No cervical adenopathy.  Skin:    General: Skin is warm and dry.     Capillary Refill: Capillary refill takes less than 2 seconds.     Coloration: Skin is not pale.     Findings: No rash.  Neurological:     Mental Status: She is alert.     Motor: No abnormal muscle tone.     Gait: Gait normal.  Psychiatric:        Mood and Affect: Mood normal.        Speech: Speech normal.        Behavior: Behavior normal.       Fall Risk:    02/24/2022   10:39 AM 12/16/2021   10:50 AM 07/14/2021    9:33 AM 05/05/2021    2:12 PM 02/23/2021    8:45 AM  Fall Risk   Falls in the past year? 0 0 1 0 0  Number falls in past yr: 0 0 0 0 0  Injury with Fall? 0 0 0 0 0  Risk for fall due to : _0   Follow up Falls prevention discussed;Education provided;Falls evaluation completed Falls prevention discussed;Education provided Falls prevention discussed Falls prevention discussed Falls prevention discussed    Functional Status Survey: Is the patient deaf or have difficulty hearing?: No Does the patient have difficulty seeing, even when wearing glasses/contacts?: No Does the patient have difficulty concentrating, remembering, or making decisions?: No Does the patient have difficulty walking or climbing stairs?: No Does the patient have difficulty dressing or bathing?: No Does the patient have difficulty doing errands alone such as visiting a doctor's office or shopping?: No   Assessment & Plan:    CPE completed today  USPSTF grade A and B recommendations reviewed with patient; age-appropriate recommendations, preventive care, screening tests, etc discussed and encouraged; healthy living encouraged; see AVS for patient education given to  patient  Discussed importance of 150 minutes of physical activity weekly, AHA exercise recommendations given to pt in AVS/handout  Discussed importance of healthy diet:  eating lean meats and proteins, avoiding trans fats and saturated fats, avoid simple sugars and excessive carbs in diet, eat 6 servings of fruit/vegetables daily and drink plenty of water and avoid sweet beverages.    Recommended pt to do annual eye exam and routine dental exams/cleanings  Depression, alcohol, fall screening completed as documented above and per flowsheets  Advance Care planning information and packet discussed and offered today, encouraged pt to discuss with family members/spouse/partner/friends and complete Advanced directive packet and bring copy to office   Reviewed Health Maintenance: Health Maintenance  Topic Date Due   MAMMOGRAM  01/11/2022   COVID-19 Vaccine (3 - Moderna series) 03/12/2022 (Originally 09/27/2019)   INFLUENZA VACCINE  07/09/2022 (Originally 11/08/2021)   Medicare Annual Wellness (AWV)  07/15/2022   DEXA SCAN  10/05/2023   COLONOSCOPY (Pts 45-77yr Insurance coverage will need to be confirmed)  11/26/2028   Pneumonia Vaccine 66 Years old  Completed   Hepatitis C Screening  Completed   Zoster Vaccines- Shingrix  Completed   HPV VACCINES  Aged Out    Immunizations: Immunization History  Administered Date(s) Administered   Influenza-Unspecified 02/09/2015   Moderna Sars-Covid-2 Vaccination 07/05/2019, 08/02/2019   Pneumococcal Conjugate-13 12/29/2019   Pneumococcal Polysaccharide-23 02/23/2021   Tdap 04/11/2011   Zoster Recombinat (Shingrix) 05/16/2019, 09/29/2019   Vaccines:  HPV: up to at age 42 , ask insurance if age between 50-45  Shingrix: 44-64 yo and ask insurance if covered when patient above 86 yo Pneumonia: done educated and discussed with patient. Flu:  educated and discussed with patient. COVID:      ICD-10-CM   1. Annual physical exam  Z00.00 COMPLETE  METABOLIC PANEL WITH GFR    CBC with Differential/Platelet    Lipid panel    Hemoglobin A1c    2. Encounter for screening mammogram for malignant neoplasm of breast  Z12.31 MM 3D SCREEN BREAST BILATERAL    3. Need for Tdap vaccination  Z23    not covered by insurance, educated on injuries that need tetanus updated- in office or at a pharmacy    4. Class 1 obesity with body mass index (BMI) of 32.0 to 32.9 in adult, unspecified obesity type, unspecified whether serious comorbidity present  A15.1 COMPLETE METABOLIC PANEL WITH GFR   Z68.32 CBC with Differential/Platelet    Lipid panel    Hemoglobin A1c    5. Medication refill  Z76.0 meloxicam (MOBIC) 15 MG tablet      Back /scoliosis PT?  Pt having more chronic and progressive back pain, she was recently given muscle relaxers and mobic refill, she will see what PT costs and let us know if she wants referral for eval/tx per PT   Return in about 6 months (around 08/25/2022) for Routine follow-up med refill.   Delsa Grana, PA-C 02/24/22 10:55 AM  [facility]

## 2022-02-28 ENCOUNTER — Telehealth: Payer: Self-pay | Admitting: Family Medicine

## 2022-02-28 DIAGNOSIS — Z0000 Encounter for general adult medical examination without abnormal findings: Secondary | ICD-10-CM

## 2022-02-28 NOTE — Telephone Encounter (Signed)
Copied from CRM [id number]. Topic: General - Other >> Feb 28, 2022 11:52 AM [place] B wrote: Reason for CRM: Patient called in wants to pick up lab paperwork to have labs done at [facility] in [place] instead of at [place]. Please call back to discuss

## 2022-02-28 NOTE — Telephone Encounter (Signed)
done

## 2022-03-01 DIAGNOSIS — Z0000 Encounter for general adult medical examination without abnormal findings: Secondary | ICD-10-CM | POA: Diagnosis not present

## 2022-03-02 LAB — COMPREHENSIVE METABOLIC PANEL
ALT: 11 IU/L (ref 0–32)
AST: 10 IU/L (ref 0–40)
Albumin/Globulin Ratio: 2.1 (ref 1.2–2.2)
Albumin: 4.4 g/dL (ref 3.9–4.9)
Alkaline Phosphatase: 70 IU/L (ref 44–121)
BUN/Creatinine Ratio: 15 (ref 12–28)
BUN: 14 mg/dL (ref 8–27)
Bilirubin Total: 0.3 mg/dL (ref 0.0–1.2)
CO2: 23 mmol/L (ref 20–29)
Calcium: 9.2 mg/dL (ref 8.7–10.3)
Chloride: 105 mmol/L (ref 96–106)
Creatinine, Ser: 0.93 mg/dL (ref 0.57–1.00)
Globulin, Total: 2.1 g/dL (ref 1.5–4.5)
Glucose: 98 mg/dL (ref 70–99)
Potassium: 4.2 mmol/L (ref 3.5–5.2)
Sodium: 142 mmol/L (ref 134–144)
Total Protein: 6.5 g/dL (ref 6.0–8.5)
eGFR: 67 mL/min/{1.73_m2} (ref >59)

## 2022-03-02 LAB — CBC WITH DIFFERENTIAL/PLATELET
Basophils Absolute: 0 10*3/uL (ref 0.0–0.2)
Basos: 1 %
EOS (ABSOLUTE): 0.2 10*3/uL (ref 0.0–0.4)
Eos: 3 %
Hematocrit: 40 % (ref 34.0–46.6)
Hemoglobin: 12.9 g/dL (ref 11.1–15.9)
Immature Grans (Abs): 0 10*3/uL (ref 0.0–0.1)
Immature Granulocytes: 0 %
Lymphocytes Absolute: 1 10*3/uL (ref 0.7–3.1)
Lymphs: 21 %
MCH: 28.4 pg (ref 26.6–33.0)
MCHC: 32.3 g/dL (ref 31.5–35.7)
MCV: 88 fL (ref 79–97)
Monocytes Absolute: 0.4 10*3/uL (ref 0.1–0.9)
Monocytes: 7 %
Neutrophils Absolute: 3.2 10*3/uL (ref 1.4–7.0)
Neutrophils: 68 %
Platelets: 190 10*3/uL (ref 150–450)
RBC: 4.54 x10E6/uL (ref 3.77–5.28)
RDW: 13.2 % (ref 11.7–15.4)
WBC: 4.8 10*3/uL (ref 3.4–10.8)

## 2022-03-02 LAB — LIPID PANEL
Chol/HDL Ratio: 2.3 ratio (ref 0.0–4.4)
Cholesterol, Total: 170 mg/dL (ref 100–199)
HDL: 74 mg/dL (ref >39)
LDL Chol Calc (NIH): 82 mg/dL (ref 0–99)
Triglycerides: 73 mg/dL (ref 0–149)
VLDL Cholesterol Cal: 14 mg/dL (ref 5–40)

## 2022-03-02 LAB — HEMOGLOBIN A1C
Est. average glucose Bld gHb Est-mCnc: 117 mg/dL
Hgb A1c MFr Bld: 5.7 % — ABNORMAL HIGH (ref 4.8–5.6)

## 2022-03-28 ENCOUNTER — Ambulatory Visit
Admission: RE | Admit: 2022-03-28 | Discharge: 2022-03-28 | Disposition: A | Discharge status: Home / Self Care | Payer: Medicare Other | Source: Ambulatory Visit | Attending: Family Medicine | Admitting: Family Medicine

## 2022-03-28 DIAGNOSIS — Z1231 Encounter for screening mammogram for malignant neoplasm of breast: Secondary | ICD-10-CM | POA: Insufficient documentation

## 2022-04-10 DIAGNOSIS — U071 COVID-19: Secondary | ICD-10-CM

## 2022-04-10 HISTORY — DX: COVID-19: U07.1

## 2022-04-17 ENCOUNTER — Encounter: Payer: Self-pay | Admitting: Family Medicine

## 2022-04-17 ENCOUNTER — Ambulatory Visit (INDEPENDENT_AMBULATORY_CARE_PROVIDER_SITE_OTHER): Payer: Medicare Other | Admitting: Family Medicine

## 2022-04-17 VITALS — BP 110/70 | HR 94 | Temp 97.8°F | Resp 16 | Ht 64.0 in | Wt 190.7 lb

## 2022-04-17 DIAGNOSIS — M419 Scoliosis, unspecified: Secondary | ICD-10-CM | POA: Diagnosis not present

## 2022-04-17 DIAGNOSIS — M5441 Lumbago with sciatica, right side: Secondary | ICD-10-CM | POA: Diagnosis not present

## 2022-04-17 DIAGNOSIS — M545 Low back pain, unspecified: Secondary | ICD-10-CM | POA: Diagnosis not present

## 2022-04-17 DIAGNOSIS — M25551 Pain in right hip: Secondary | ICD-10-CM

## 2022-04-17 DIAGNOSIS — Y92009 Unspecified place in unspecified non-institutional (private) residence as the place of occurrence of the external cause: Secondary | ICD-10-CM

## 2022-04-17 DIAGNOSIS — W19XXXA Unspecified fall, initial encounter: Secondary | ICD-10-CM

## 2022-04-17 DIAGNOSIS — G8929 Other chronic pain: Secondary | ICD-10-CM

## 2022-04-17 DIAGNOSIS — R1030 Lower abdominal pain, unspecified: Secondary | ICD-10-CM

## 2022-04-17 MED ORDER — MELOXICAM 15 MG PO TABS: 15.0000 mg | ORAL_TABLET | Freq: Every day | ORAL | 3 refills | Status: DC | PRN

## 2022-04-17 MED ORDER — FAMOTIDINE 20 MG PO TABS: 20.0000 mg | ORAL_TABLET | Freq: Two times a day (BID) | ORAL | 1 refills | Status: AC | PRN

## 2022-04-17 MED ORDER — METHOCARBAMOL 500 MG PO TABS: 500.0000 mg | ORAL_TABLET | Freq: Three times a day (TID) | ORAL | 1 refills | Status: DC | PRN

## 2022-04-17 MED ORDER — METHYLPREDNISOLONE 4 MG PO TBPK: ORAL_TABLET | ORAL | 0 refills | Status: DC

## 2022-04-17 NOTE — Progress Notes (Signed)
Patient ID: Olivia Morgan, female    DOB: March 29, 1956, 67 y.o.   MRN: [id number]  PCP: Danelle Berry, PA-C  Chief Complaint  Patient presents with   [facility] and fell a week ago and pain in back is getting worst.   Abdominal Pain    Pt states stomach has been hurting for the past few days along with not wanting to eat   Headache    Subjective:   Olivia Morgan is a 67 y.o. female, presents to clinic with CC of the following:  HPI  Patient presents for fall she states she slipped and fell in the kitchen she was going around the corner when her feet slipped out from underneath her causing her to fall hitting her bottom on the floor most of the impact was on her right buttocks and right hip She states initially she had no pain and she popped right up, however over the next several days she developed severe pain.  She reports limping and any activity made it worse and only laying down in bed made it feel better.  She has a history of scoliosis and prior chronic low back pain for which she previously declined physical therapy eval and referral but she has been thinking it would be helpful.  She has history of left hip replacement, right total knee replacement she has noted pain to across her low back worse on the right low back seems to wrap around her right hip and buttocks, she also is having some left knee pain feels like burning She denies any lower extremity weakness numbness or tingling, denies incontinence of stool or urine Reviewed her recent bone density scan which was normal She has tried taking Tylenol and 2 different muscle relaxers at home also topical cream but her pain continues to be extremely severe She does usually take meloxicam but she has run out of this She believes that old methocarbamol was more helpful than the tizanidine She would like to try steroids and she is considering physical therapy  She has had some abdominal discomfort, she is not sure if this  is related to her pain or something else but she reports no change in bowels or urination  Patient Active Problem List   Diagnosis Date Noted   Rectal bleeding    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   History of total hip replacement 03/05/2017   Dermatitis 02/12/2017   Allergic rhinitis 04/16/2016   Presence of right artificial knee joint 12/16/2015   Obesity (BMI 30.0-34.9) 11/18/2015   Vitamin D deficiency 11/11/2015   Mass of left lower leg 09/07/2015   Adverse drug reaction 06/13/2015   Greater trochanteric bursitis of left hip 06/05/2015   Hammer toe of right foot 06/05/2015   Neuropathy of both feet 06/05/2015   Chronic kidney disease, 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/05/2015   Lower back pain 03/10/2015   OA (osteoarthritis) of hip    GERD (gastroesophageal reflux disease)    Plantar fasciitis    Depression    Genital herpes    Bilateral hearing loss    Abnormal ECG 09/09/2014   Hip pain 09/09/2012   Other mechanical complication of other internal orthopedic device, implant, and graft 09/09/2012   Medial collateral ligament sprain of knee 11/13/2011   Other and unspecified derangement of medial meniscus 06/13/2011   Primary localized osteoarthrosis, lower leg 06/13/2011      Current Outpatient Medications:    azelastine (ASTELIN)  0.1 % nasal spray, Place into the nose., Disp: , Rfl:    buPROPion (WELLBUTRIN XL) 150 MG 24 hr tablet, Take 1 tablet (150 mg total) by mouth 2 (two) times daily., Disp: 90 tablet, Rfl: 1   cholecalciferol (VITAMIN D3) 25 MCG (1000 UNIT) tablet, Take 1,000 Units by mouth daily., Disp: , Rfl:    diphenhydrAMINE (BENADRYL) 25 MG tablet, Take 25 mg by mouth every 6 (six) hours as needed., Disp: , Rfl:    meloxicam (MOBIC) 15 MG tablet, Take 0.5-1 tablets (7.5-15 mg total) by mouth daily as needed for pain., Disp: 30 tablet, Rfl: 5   Multiple Vitamins-Minerals (HAIR SKIN AND NAILS FORMULA PO), Take by mouth., Disp: , Rfl:     RABEprazole (ACIPHEX) 20 MG tablet, Take 1 tablet (20 mg total) by mouth daily., Disp: 90 tablet, Rfl: 3   rizatriptan (MAXALT) 5 MG tablet, Take 1 tablet (5 mg total) by mouth as needed for migraine. May repeat in 2 hours if needed, Disp: 10 tablet, Rfl: 0   tiZANidine (ZANAFLEX) 4 MG tablet, TAKE 1 TABLET(4 MG) BY MOUTH EVERY 6 HOURS AS NEEDED FOR MUSCLE SPASMS, Disp: 30 tablet, Rfl: 0   valACYclovir (VALTREX) 500 MG tablet, Take 1 tablet (500 mg total) by mouth daily., Disp: 90 tablet, Rfl: 1   vitamin E 180 MG (400 UNITS) capsule, Take 400 Units by mouth daily., Disp: , Rfl:    Allergies  Allergen Reactions   Levofloxacin Hives   Morphine Nausea Only   Celecoxib Other (See Comments)    SOB   Celexa [Citalopram Hydrobromide]    Cymbalta [Duloxetine Hcl] Hives   Gabapentin Other (See Comments)    anger   Levaquin [Levofloxacin In D5w] Hives   Valium [Diazepam] Other (See Comments)    hallucinations   Monistat [Miconazole] Rash   Prednisone Anxiety     Social History   Tobacco Use   Smoking status: Never   Smokeless tobacco: Never  Vaping Use   Vaping Use: Never used  Substance Use Topics   Alcohol use: Yes    Comment: socially   Drug use: No      Chart Review Today: I personally reviewed active problem list, medication list, allergies, family history, social history, health maintenance, notes from last encounter, lab results, imaging with the patient/caregiver today.   Review of Systems  Constitutional: Negative.   HENT: Negative.    Eyes: Negative.   Respiratory: Negative.    Cardiovascular: Negative.   Gastrointestinal: Negative.   Endocrine: Negative.   Genitourinary: Negative.   Musculoskeletal: Negative.   Skin: Negative.   Allergic/Immunologic: Negative.   Neurological: Negative.   Hematological: Negative.   Psychiatric/Behavioral: Negative.    All other systems reviewed and are negative.      Objective:   Vitals:   04/17/22 1132  BP: 110/70   Pulse: 94  Resp: 16  Temp: 97.8 F (36.6 C)  TempSrc: Oral  SpO2: 99%  Weight: 190 lb 11.2 oz (86.5 kg)  Height: 5\' 4"  (1.626 m)    Body mass index is 32.73 kg/m.  Physical Exam Vitals and nursing note reviewed.  Constitutional:      General: She is not in acute distress.    Appearance: She is well-developed. She is obese. She is not ill-appearing, toxic-appearing or diaphoretic.  HENT:     Head: Normocephalic and atraumatic.     Nose: Nose normal.  Eyes:     General:        Right eye:  No discharge.        Left eye: No discharge.     Conjunctiva/sclera: Conjunctivae normal.  Neck:     Trachea: No tracheal deviation.  Cardiovascular:     Rate and Rhythm: Normal rate and regular rhythm.  Pulmonary:     Effort: Pulmonary effort is normal. No respiratory distress.     Breath sounds: No stridor.  Abdominal:     General: Bowel sounds are normal.     Palpations: Abdomen is soft.  Musculoskeletal:     Thoracic back: Scoliosis present.     Lumbar back: Positive right straight leg raise test.     Comments: No midline tenderness from cervical to lumbar spine no step-off No paraspinal muscle tenderness from cervical to lumbar spine Low right back around SI joint and wrapping around hip is tender to palpation without any bony tenderness or deformity   Skin:    General: Skin is warm and dry.     Findings: No rash.  Neurological:     Mental Status: She is alert.     Motor: No abnormal muscle tone.     Coordination: Coordination normal.     Gait: Gait abnormal.  Psychiatric:        Behavior: Behavior normal.      Results for orders placed or performed in visit on 02/28/22  Hemoglobin A1C  Result Value Ref Range   Hgb A1c MFr Bld 5.7 (H) 4.8 - 5.6 %   Est. average glucose Bld gHb Est-mCnc 117 mg/dL  Lipid panel  Result Value Ref Range   Cholesterol, Total 170 100 - 199 mg/dL   Triglycerides 73 0 - 149 mg/dL   HDL 74 >71 mg/dL   VLDL Cholesterol Cal 14 5 - 40 mg/dL    LDL Chol Calc (NIH) 82 0 - 99 mg/dL   Chol/HDL Ratio 2.3 0.0 - 4.4 ratio  CBC w/Diff/Platelet  Result Value Ref Range   WBC 4.8 3.4 - 10.8 x10E3/uL   RBC 4.54 3.77 - 5.28 x10E6/uL   Hemoglobin 12.9 11.1 - 15.9 g/dL   Hematocrit 06.2 69.4 - 46.6 %   MCV 88 79 - 97 fL   MCH 28.4 26.6 - 33.0 pg   MCHC 32.3 31.5 - 35.7 g/dL   RDW 85.4 62.7 - 03.5 %   Platelets 190 150 - 450 x10E3/uL   Neutrophils 68 Not Estab. %   Lymphs 21 Not Estab. %   Monocytes 7 Not Estab. %   Eos 3 Not Estab. %   Basos 1 Not Estab. %   Neutrophils Absolute 3.2 1.4 - 7.0 x10E3/uL   Lymphocytes Absolute 1.0 0.7 - 3.1 x10E3/uL   Monocytes Absolute 0.4 0.1 - 0.9 x10E3/uL   EOS (ABSOLUTE) 0.2 0.0 - 0.4 x10E3/uL   Basophils Absolute 0.0 0.0 - 0.2 x10E3/uL   Immature Granulocytes 0 Not Estab. %   Immature Grans (Abs) 0.0 0.0 - 0.1 x10E3/uL  Comprehensive Metabolic Panel (CMET)  Result Value Ref Range   Glucose 98 70 - 99 mg/dL   BUN 14 8 - 27 mg/dL   Creatinine, Ser 0.09 0.57 - 1.00 mg/dL   eGFR 67 >38 HW/EXH/3.71   BUN/Creatinine Ratio 15 12 - 28   Sodium 142 134 - 144 mmol/L   Potassium 4.2 3.5 - 5.2 mmol/L   Chloride 105 96 - 106 mmol/L   CO2 23 20 - 29 mmol/L   Calcium 9.2 8.7 - 10.3 mg/dL   Total Protein 6.5 6.0 - 8.5 g/dL  Albumin 4.4 3.9 - 4.9 g/dL   Globulin, Total 2.1 1.5 - 4.5 g/dL   Albumin/Globulin Ratio 2.1 1.2 - 2.2   Bilirubin Total 0.3 0.0 - 1.2 mg/dL   Alkaline Phosphatase 70 44 - 121 IU/L   AST 10 0 - 40 IU/L   ALT 11 0 - 32 IU/L       Assessment & Plan:     ICD-10-CM   1. Acute right-sided low back pain with right-sided sciatica  M54.41 DG Lumbar Spine Complete    DG Hip Unilat W OR W/O Pelvis 2-3 Views Right    methocarbamol (ROBAXIN) 500 MG tablet    methylPREDNISolone (MEDROL DOSEPAK) 4 MG TBPK tablet    Ambulatory referral to Physical Therapy   fall with R buttock contusion pain developed over 2 d, right SI joint ttp and + SLR, tx with steroids, mobic, robaxin, PT eval and  treatment, screening xrays    2. Chronic left-sided low back pain without sciatica  M54.50 meloxicam (MOBIC) 15 MG tablet   G89.29 methocarbamol (ROBAXIN) 500 MG tablet    Ambulatory referral to Physical Therapy   History of chronic low back pain with uneven hips pelvis due to joint replacements in her scoliosis she would like to work with PT long-term on this    3. Right hip pain  M25.551 DG Hip Unilat W OR W/O Pelvis 2-3 Views Right    Ambulatory referral to Physical Therapy   Right hip joint has good range of motion no pain with internal and external rotation but pain with standing, screening x-rays    4. Scoliosis, unspecified scoliosis type, unspecified spinal region  M41.9 DG Lumbar Spine Complete    DG Hip Unilat W OR W/O Pelvis 2-3 Views Right    Ambulatory referral to Physical Therapy    5. Fall in home, initial encounter  W19.XXXA DG Lumbar Spine Complete   Y92.009 DG Hip Unilat W OR W/O Pelvis 2-3 Views Right    Ambulatory referral to Physical Therapy   Slipped in the kitchen without head trauma or loss of consciousness no pain immediately but gradual development of pain over a couple days    6. Lower abdominal pain  R10.30 famotidine (PEPCID) 20 MG tablet   Some vague abdominal discomfort he wonders if it is from her pain encouraged her to try Pepcid with her Aciphex     PT to eval and treat - f/up in 6-12 weeks      Danelle Berry, PA-C 04/17/22 11:45 AM

## 2022-04-26 ENCOUNTER — Encounter: Payer: Self-pay | Admitting: Family Medicine

## 2022-04-26 ENCOUNTER — Telehealth (INDEPENDENT_AMBULATORY_CARE_PROVIDER_SITE_OTHER): Payer: Medicare Other | Admitting: Family Medicine

## 2022-04-26 ENCOUNTER — Other Ambulatory Visit: Payer: Self-pay | Admitting: Family Medicine

## 2022-04-26 ENCOUNTER — Ambulatory Visit: Payer: Self-pay

## 2022-04-26 ENCOUNTER — Ambulatory Visit: Payer: Self-pay | Admitting: *Deleted

## 2022-04-26 VITALS — Ht 64.0 in | Wt 190.0 lb

## 2022-04-26 DIAGNOSIS — R051 Acute cough: Secondary | ICD-10-CM

## 2022-04-26 DIAGNOSIS — U071 COVID-19: Secondary | ICD-10-CM | POA: Diagnosis not present

## 2022-04-26 DIAGNOSIS — G43009 Migraine without aura, not intractable, without status migrainosus: Secondary | ICD-10-CM

## 2022-04-26 MED ORDER — MUCINEX DM 30-600 MG PO TB12: 1.0000 | ORAL_TABLET | Freq: Two times a day (BID) | ORAL | 0 refills | Status: DC

## 2022-04-26 MED ORDER — BENZONATATE 100 MG PO CAPS: 100.0000 mg | ORAL_CAPSULE | Freq: Two times a day (BID) | ORAL | 1 refills | Status: DC | PRN

## 2022-04-26 MED ORDER — RIZATRIPTAN BENZOATE 5 MG PO TABS: 5.0000 mg | ORAL_TABLET | ORAL | 0 refills | Status: DC | PRN

## 2022-04-26 MED ORDER — PULSE OXIMETER FOR FINGER MISC: 1.0000 | Freq: Every day | 0 refills | Status: DC

## 2022-04-26 NOTE — Telephone Encounter (Signed)
Pt will need an appointment  

## 2022-04-26 NOTE — Telephone Encounter (Signed)
  Chief Complaint: covid positive requesting medication Symptoms: cough , congestion, fatigue Frequency: sx started Sunday night 04/23/22 into Monday 04/24/22 Pertinent Negatives: Patient denies chest pain no difficulty breathing, no fever Disposition: [] ED /[] Urgent Care (no appt availability in office) / [x] Appointment(In office/virtual)/ []  [facility]/ [] Home Care/ [] Refused Recommended Disposition /[] [facility]/ []  Follow-up with PCP Additional Notes:   My chart VV scheduled for 04/27/22.     Reason for Disposition  [1] HIGH RISK patient (e.g., weak immune system, age > 38 years, obesity with BMI 30 or higher, pregnant, chronic lung disease or other chronic medical condition) AND [2] COVID symptoms (e.g., cough, fever)  (Exceptions: Already seen by PCP and no new or worsening symptoms.)  Answer Assessment - Initial Assessment Questions 1. COVID-19 DIAGNOSIS: "How do you know that you have COVID?" (e.g., positive lab test or self-test, diagnosed by doctor or NP/PA, symptoms after exposure).     Positive at home test last night 04/25/22 2. COVID-19 EXPOSURE: "Was there any known exposure to COVID before the symptoms began?" CDC Definition of close contact: within 6 feet (2 meters) for a total of 15 minutes or more over a 24-hour period.      Na  3. ONSET: "When did the COVID-19 symptoms start?"      Sunday night Monday morning coughing  4. WORST SYMPTOM: "What is your worst symptom?" (e.g., cough, fever, shortness of breath, muscle aches)     Cough , chills,  5. COUGH: "Do you have a cough?" If Yes, ask: "How bad is the cough?"       Productive cough, yellowish  6. FEVER: "Do you have a fever?" If Yes, ask: "What is your temperature, how was it measured, and when did it start?"     na 7. RESPIRATORY STATUS: "Describe your breathing?" (e.g., normal; shortness of breath, wheezing, unable to speak)      Denies  8. BETTER-SAME-WORSE: "Are you getting better, staying  the same or getting worse compared to yesterday?"  If getting worse, ask, "In what way?"     better 9. OTHER SYMPTOMS: "Do you have any other symptoms?"  (e.g., chills, fatigue, headache, loss of smell or taste, muscle pain, sore throat)     Cough fatigue sore throat better  10. HIGH RISK DISEASE: "Do you have any chronic medical problems?" (e.g., asthma, heart or lung disease, weak immune system, obesity, etc.)       Prediabetic  11. VACCINE: "Have you had the COVID-19 vaccine?" If Yes, ask: "Which one, how many shots, when did you get it?"       First series, and 1 booster  12. PREGNANCY: "Is there any chance you are pregnant?" "When was your last menstrual period?"       na 13. O2 SATURATION MONITOR:  "Do you use an oxygen saturation monitor (pulse oximeter) at home?" If Yes, ask "What is your reading (oxygen level) today?" "What is your usual oxygen saturation reading?" (e.g., 95%)       na  Protocols used: Coronavirus (COVID-19) Diagnosed or Suspected-A-AH

## 2022-04-26 NOTE — Telephone Encounter (Signed)
Second attempt to reach pt. Left message

## 2022-04-26 NOTE — Progress Notes (Signed)
Name: Olivia Morgan   MRN: [id number]    DOB: 05-11-1955   Date:04/26/2022       Progress Note  Subjective  Chief Complaint  Chief Complaint  Patient presents with   Covid Positive    Home test    I connected with  Kenise Barraco Vannatter  on 04/26/22 at  9:20 AM EST by a video enabled telemedicine application and verified that I am speaking with the correct person using two identifiers.  I discussed the limitations of evaluation and management by telemedicine and the availability of in person appointments. The patient expressed understanding and agreed to proceed with the virtual visit  Staff also discussed with the patient that there may be a patient responsible charge related to this service. Patient Location: at home  Provider Location: [facility]  Additional Individuals present: alone   HPI  COVID-19 : symptoms started 3 days ago, woke up in the middle of the night coughing, the following day had nasal congestion, it felt like a cold, however yesterday she felt very fatigued, headache, at times feels hard to breath but no SOB. Cough is usually dry but some mucus in the mornings. She has normal appetite, staying hydrated   She has been taking cold and flu otc cold medication.   Patient Active Problem List   Diagnosis Date Noted   Rectal bleeding    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   History of total hip replacement 03/05/2017   Dermatitis 02/12/2017   Allergic rhinitis 04/16/2016   Presence of right artificial knee joint 12/16/2015   Obesity (BMI 30.0-34.9) 11/18/2015   Vitamin D deficiency 11/11/2015   Mass of left lower leg 09/07/2015   Adverse drug reaction 06/13/2015   Greater trochanteric bursitis of left hip 06/05/2015   Hammer toe of right foot 06/05/2015   Neuropathy of both feet 06/05/2015   Chronic kidney disease, 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/05/2015   Lower back pain 03/10/2015   OA (osteoarthritis) of hip    GERD (gastroesophageal reflux  disease)    Plantar fasciitis    Depression    Genital herpes    Bilateral hearing loss    Abnormal ECG 09/09/2014   Hip pain 09/09/2012   Other mechanical complication of other internal orthopedic device, implant, and graft 09/09/2012   Medial collateral ligament sprain of knee 11/13/2011   Other and unspecified derangement of medial meniscus 06/13/2011   Primary localized osteoarthrosis, lower leg 06/13/2011    Past Surgical History:  Procedure Laterality Date   CESAREAN SECTION     COLONOSCOPY  2010   COLONOSCOPY WITH PROPOFOL N/A 11/27/2018   Procedure: COLONOSCOPY WITH PROPOFOL;  Surgeon: Toney Reil, MD;  Location: [facility];  Service: Endoscopy;  Laterality: N/A;   ENDOSCOPIC CONCHA BULLOSA RESECTION Left 05/23/2018   Procedure: ENDOSCOPIC CONCHA BULLOSA RESECTION;  Surgeon: Vernie Murders, MD;  Location: [facility];  Service: ENT;  Laterality: Left;   FOOT SURGERY Right    REPLACEMENT TOTAL KNEE  3/13   Dr.Hallows at [facility]   SEPTOPLASTY Bilateral 05/23/2018   Procedure: SEPTOPLASTY;  Surgeon: Vernie Murders, MD;  Location: [facility];  Service: ENT;  Laterality: Bilateral;   TOTAL HIP ARTHROPLASTY Left 10/2008   TURBINATE REDUCTION Bilateral 05/23/2018   Procedure: INFERIOR TURBINATE REDUCTION;  Surgeon: Vernie Murders, MD;  Location: [facility];  Service: ENT;  Laterality: Bilateral;   UPPER GI ENDOSCOPY  1992    Family History  Problem Relation  Age of Onset   Aneurysm Mother    Stroke Mother    Heart disease Mother    Heart disease Father 80   Hypertension Father    Lung disease Father    Heart disease Paternal Grandfather    Panic disorder Brother    Milk intolerance Daughter    Polycystic ovary syndrome Daughter    Obesity Brother    Hyperlipidemia Brother    Hypertension Brother    Diabetes Brother    Cancer Neg Hx    COPD Neg Hx    Breast cancer Neg Hx     Social History   Socioeconomic History    Marital status: Divorced    Spouse name: Not on file   Number of children: 1   Years of education: Not on file   Highest education level: Not on file  Occupational History   Not on file  Tobacco Use   Smoking status: Never   Smokeless tobacco: Never  Vaping Use   Vaping Use: Never used  Substance and Sexual Activity   Alcohol use: Yes    Comment: socially   Drug use: No   Sexual activity: Yes    Birth control/protection: Post-menopausal  Other Topics Concern   Not on file  Social History Narrative   Not on file   Social Determinants of Health   Financial Resource Strain: Low Risk  (02/24/2022)   Overall Financial Resource Strain (CARDIA)    Difficulty of Paying Living Expenses: Not hard at all  Food Insecurity: No Food Insecurity (02/24/2022)   Hunger Vital Sign    Worried About Running Out of Food in the Last Year: Never true    Ran Out of Food in the Last Year: Never true  Transportation Needs: No Transportation Needs (02/24/2022)   PRAPARE - Administrator, Civil Service (Medical): No    Lack of Transportation (Non-Medical): No  Physical Activity: Insufficiently Active (02/24/2022)   Exercise Vital Sign    Days of Exercise per Week: 2 days    Minutes of Exercise per Session: 20 min  Stress: No Stress Concern Present (02/24/2022)   [employer] of Occupational Health - Occupational Stress Questionnaire    Feeling of Stress : Only a little  Social Connections: Moderately Isolated (02/24/2022)   Social Connection and Isolation Panel [NHANES]    Frequency of Communication with Friends and Family: More than three times a week    Frequency of Social Gatherings with Friends and Family: More than three times a week    Attends Religious Services: Never    Database administrator or Organizations: Yes    Attends Engineer, structural: More than 4 times per year    Marital Status: Divorced  Intimate Partner Violence: Not At Risk (02/24/2022)    Humiliation, Afraid, Rape, and Kick questionnaire    Fear of Current or Ex-Partner: No    Emotionally Abused: No    Physically Abused: No    Sexually Abused: No     Current Outpatient Medications:    azelastine (ASTELIN) 0.1 % nasal spray, Place into the nose., Disp: , Rfl:    buPROPion (WELLBUTRIN XL) 150 MG 24 hr tablet, Take 1 tablet (150 mg total) by mouth 2 (two) times daily., Disp: 90 tablet, Rfl: 1   cholecalciferol (VITAMIN D3) 25 MCG (1000 UNIT) tablet, Take 1,000 Units by mouth daily., Disp: , Rfl:    diphenhydrAMINE (BENADRYL) 25 MG tablet, Take 25 mg by mouth every 6 (six)  hours as needed., Disp: , Rfl:    famotidine (PEPCID) 20 MG tablet, Take 1 tablet (20 mg total) by mouth 2 (two) times daily as needed for heartburn or indigestion., Disp: 60 tablet, Rfl: 1   meloxicam (MOBIC) 15 MG tablet, Take 1 tablet (15 mg total) by mouth daily as needed for pain., Disp: 90 tablet, Rfl: 3   methocarbamol (ROBAXIN) 500 MG tablet, Take 1 tablet (500 mg total) by mouth every 8 (eight) hours as needed for muscle spasms., Disp: 60 tablet, Rfl: 1   methylPREDNISolone (MEDROL DOSEPAK) 4 MG TBPK tablet, Take as directed, Disp: 21 tablet, Rfl: 0   Multiple Vitamins-Minerals (HAIR SKIN AND NAILS FORMULA PO), Take by mouth., Disp: , Rfl:    RABEprazole (ACIPHEX) 20 MG tablet, Take 1 tablet (20 mg total) by mouth daily., Disp: 90 tablet, Rfl: 3   rizatriptan (MAXALT) 5 MG tablet, Take 1 tablet (5 mg total) by mouth as needed for migraine. May repeat in 2 hours if needed, Disp: 10 tablet, Rfl: 0   valACYclovir (VALTREX) 500 MG tablet, Take 1 tablet (500 mg total) by mouth daily., Disp: 90 tablet, Rfl: 1   vitamin E 180 MG (400 UNITS) capsule, Take 400 Units by mouth daily., Disp: , Rfl:   Allergies  Allergen Reactions   Levofloxacin Hives   Morphine Nausea Only   Celecoxib Other (See Comments)    SOB   Celexa [Citalopram Hydrobromide]    Cymbalta [Duloxetine Hcl] Hives   Gabapentin Other (See  Comments)    anger   Levaquin [Levofloxacin In D5w] Hives   Valium [Diazepam] Other (See Comments)    hallucinations   Monistat [Miconazole] Rash   Prednisone Anxiety    I personally reviewed active problem list, medication list, allergies, family history with the patient/caregiver today.   ROS  Ten systems reviewed and is negative except as mentioned in HPI   Objective  Virtual encounter, vitals not obtained.  Body mass index is 32.61 kg/m.  Physical Exam  Awake, alert and oriented  PHQ2/9:    04/17/2022   11:31 AM 02/24/2022   10:39 AM 12/16/2021   10:50 AM 07/14/2021    9:30 AM 05/05/2021    2:12 PM  Depression screen PHQ 2/9  Decreased Interest 0 0 0 0 0  Down, Depressed, Hopeless 0 0 0 0 0  PHQ - 2 Score 0 0 0 0 0  Altered sleeping 0 0 0  0  Tired, decreased energy 0 0 0  0  Change in appetite 0 0 0  0  Feeling bad or failure about yourself  0 0 0  0  Trouble concentrating 0 0 0  0  Moving slowly or fidgety/restless 0 0 0  0  Suicidal thoughts 0 0 0  0  PHQ-9 Score 0 0 0  0  Difficult doing work/chores Not difficult at all Not difficult at all Not difficult at all  Not difficult at all   PHQ-2/9 Result is negative.    Fall Risk:    04/17/2022   11:31 AM 02/24/2022   10:39 AM 12/16/2021   10:50 AM 07/14/2021    9:33 AM 05/05/2021    2:12 PM  Fall Risk   Falls in the past year? 1 0 0 1 0  Number falls in past yr: 0 0 0 0 0  Injury with Fall? 0 0 0 0 0  Risk for fall due to : Impaired balance/gait No Fall Risks No Fall Risks No Fall Risks  No Fall Risks  Follow up Falls prevention discussed;Education provided;Falls evaluation completed Falls prevention discussed;Education provided;Falls evaluation completed Falls prevention discussed;Education provided Falls prevention discussed Falls prevention discussed     Assessment & Plan  1. COVID-19  Advised her to go to [facility] if pulse ox stays below 92 % or she gets worse, continue drinking fluids and resting, discussed  quarantine. Her daughter will check on her and will pick up her medications from pharmacy We discussed Paxlovid but since symptoms are not severe we decided not to start medication at this time Call back if no improvement of symptoms   - Dextromethorphan-guaiFENesin (MUCINEX DM) 30-600 MG TB12; Take 1 tablet by mouth 2 (two) times daily.  Dispense: 28 tablet; Refill: 0 - benzonatate (TESSALON) 100 MG capsule; Take 1-2 capsules (100-200 mg total) by mouth 2 (two) times daily as needed for cough.  Dispense: 30 capsule; Refill: 1 - Misc. Devices (PULSE OXIMETER FOR FINGER) MISC; 1 each by Does not apply route daily at 12 noon.  Dispense: 1 each; Refill: 0  2. Acute cough  - Dextromethorphan-guaiFENesin (MUCINEX DM) 30-600 MG TB12; Take 1 tablet by mouth 2 (two) times daily.  Dispense: 28 tablet; Refill: 0 - benzonatate (TESSALON) 100 MG capsule; Take 1-2 capsules (100-200 mg total) by mouth 2 (two) times daily as needed for cough.  Dispense: 30 capsule; Refill: 1 - Misc. Devices (PULSE OXIMETER FOR FINGER) MISC; 1 each by Does not apply route daily at 12 noon.  Dispense: 1 each; Refill: 0   I discussed the assessment and treatment plan with the patient. The patient was provided an opportunity to ask questions and all were answered. The patient agreed with the plan and demonstrated an understanding of the instructions.  The patient was advised to call back or seek an in-person evaluation if the symptoms worsen or if the condition fails to improve as anticipated.  I provided 15  minutes of non-face-to-face time during this encounter.

## 2022-04-26 NOTE — Telephone Encounter (Signed)
Patient stated that she tested positive for covid last night  and wanted to know anything can be called in for her. Per patient she took over some leftover rizatriptan last night for her having headaches due to covid and wanting to know what else she can do.   Left message to call back about symptoms.

## 2022-04-26 NOTE — Progress Notes (Deleted)
Virtual Visit via Video Note  I connected with Olivia Morgan on 04/26/22 at  9:40 AM EST by a video enabled telemedicine application and verified that I am speaking with the correct person using two identifiers.  Location: Patient: *** Provider: ***   I discussed the limitations of evaluation and management by telemedicine and the availability of in person appointments. The patient expressed understanding and agreed to proceed.  History of Present Illness:  Patient presenting via telemedicine after having a home positive COVID test.   COVID+:   -Worst symptom: -Fever: {Blank single:19197::"yes","no"} -Cough: {Blank single:19197::"yes","no"} -Shortness of breath: {Blank single:19197::"yes","no"} -Wheezing: {Blank single:19197::"yes","no"} -Chest pain: {Blank single:19197::"yes","no","yes, with cough"} -Chest tightness: {Blank single:19197::"yes","no"} -Chest congestion: {Blank single:19197::"yes","no"} -Nasal congestion: {Blank single:19197::"yes","no"} -Runny nose: {Blank single:19197::"yes","no"} -Post nasal drip: {Blank single:19197::"yes","no"} -Sneezing: {Blank single:19197::"yes","no"} -Sore throat: {Blank single:19197::"yes","no"} -Swollen glands: {Blank single:19197::"yes","no"} -Sinus pressure: {Blank single:19197::"yes","no"} -Headache: {Blank single:19197::"yes","no"} -Face pain: {Blank single:19197::"yes","no"} -Toothache: {Blank single:19197::"yes","no"} -Ear pain: {Blank single:19197::"yes","no"} {Blank single:19197::""right","left", "bilateral"} -Ear pressure: {Blank single:19197::"yes","no"} {Blank single:19197::""right","left", "bilateral"} -Eyes red/itching:{Blank single:19197::"yes","no"} -Eye drainage/crusting: {Blank single:19197::"yes","no"}  -Vomiting: {Blank single:19197::"yes","no"} -Rash: {Blank single:19197::"yes","no"} -Fatigue: {Blank single:19197::"yes","no"} -Sick contacts: {Blank single:19197::"yes","no"} -Strep contacts: {Blank  single:19197::"yes","no"}  -Context: {Blank multiple:19196::"better","worse","stable","fluctuating"} -Recurrent sinusitis: {Blank single:19197::"yes","no"} -Relief with OTC cold/cough medications: {Blank single:19197::"yes","no"}  -Treatments attempted: {Blank multiple:19196::"none","cold/sinus","mucinex","anti-histamine","pseudoephedrine","cough syrup","antibiotics"}      Observations/Objective:   Assessment and Plan:   Follow Up Instructions:    I discussed the assessment and treatment plan with the patient. The patient was provided an opportunity to ask questions and all were answered. The patient agreed with the plan and demonstrated an understanding of the instructions.   The patient was advised to call back or seek an in-person evaluation if the symptoms worsen or if the condition fails to improve as anticipated.  I provided *** minutes of non-face-to-face time during this encounter.   Teodora Medici, DO

## 2022-04-26 NOTE — Telephone Encounter (Signed)
Third attempt to reach pt. Left message to call back about symptoms. 

## 2022-04-27 ENCOUNTER — Telehealth: Payer: Medicare Other | Admitting: Internal Medicine

## 2022-05-08 ENCOUNTER — Other Ambulatory Visit: Payer: Self-pay | Admitting: Family Medicine

## 2022-05-08 DIAGNOSIS — K219 Gastro-esophageal reflux disease without esophagitis: Secondary | ICD-10-CM

## 2022-06-14 ENCOUNTER — Other Ambulatory Visit: Payer: Self-pay

## 2022-06-14 ENCOUNTER — Ambulatory Visit: Payer: Medicare Other | Attending: Family Medicine | Admitting: Physical Therapy

## 2022-06-14 DIAGNOSIS — Y92009 Unspecified place in unspecified non-institutional (private) residence as the place of occurrence of the external cause: Secondary | ICD-10-CM | POA: Insufficient documentation

## 2022-06-14 DIAGNOSIS — M419 Scoliosis, unspecified: Secondary | ICD-10-CM | POA: Insufficient documentation

## 2022-06-14 DIAGNOSIS — G8929 Other chronic pain: Secondary | ICD-10-CM | POA: Diagnosis not present

## 2022-06-14 DIAGNOSIS — M5441 Lumbago with sciatica, right side: Secondary | ICD-10-CM | POA: Insufficient documentation

## 2022-06-14 DIAGNOSIS — M25551 Pain in right hip: Secondary | ICD-10-CM | POA: Diagnosis not present

## 2022-06-14 DIAGNOSIS — M25562 Pain in left knee: Secondary | ICD-10-CM | POA: Diagnosis not present

## 2022-06-14 DIAGNOSIS — W19XXXA Unspecified fall, initial encounter: Secondary | ICD-10-CM | POA: Insufficient documentation

## 2022-06-14 NOTE — Therapy (Addendum)
OUTPATIENT PHYSICAL THERAPY THORACOLUMBAR EVALUATION   Patient Name: Olivia Morgan MRN: [id number] DOB:December 15, 1955, 67 y.o., female Today's Date: 06/14/2022  END OF SESSION:  PT End of Session - 06/14/22 1956     Visit Number 1    Number of Visits 20    Date for PT Re-Evaluation 08/23/22    Authorization Type [employer]    Authorization - Visit Number 1    Authorization - Number of Visits 20    Progress Note Due on Visit 10    PT Start Time [id number]    PT Stop Time 1630    PT Time Calculation (min) 45 min    Activity Tolerance Patient tolerated treatment well    Behavior During Therapy [facility] for tasks assessed/performed             Past Medical History:  Diagnosis Date   Acute pain of left shoulder 06/08/2020   Allergic reaction 02/12/2017   Bilateral hearing loss    wears hearing aids   Depression    Genital herpes    GERD (gastroesophageal reflux disease)    Herpes simplex virus infection    Murmur    Neuropathy of both feet 06/05/2015   OA (osteoarthritis) of hip    left   Osteopenia of lumbar spine 2010   Plantar fasciitis    Stomatitis    Past Surgical History:  Procedure Laterality Date   CESAREAN SECTION     COLONOSCOPY  2010   COLONOSCOPY WITH PROPOFOL N/A 11/27/2018   Procedure: COLONOSCOPY WITH PROPOFOL;  Surgeon: Lin Landsman, MD;  Location: [place];  Service: Endoscopy;  Laterality: N/A;   ENDOSCOPIC CONCHA BULLOSA RESECTION Left 05/23/2018   Procedure: ENDOSCOPIC CONCHA BULLOSA RESECTION;  Surgeon: Margaretha Sheffield, MD;  Location: [place];  Service: ENT;  Laterality: Left;   FOOT SURGERY Right    REPLACEMENT TOTAL KNEE  3/13   Dr.Hallows at [place] Bilateral 05/23/2018   Procedure: SEPTOPLASTY;  Surgeon: Margaretha Sheffield, MD;  Location: [place];  Service: ENT;  Laterality: Bilateral;   TOTAL HIP ARTHROPLASTY Left 10/2008   TURBINATE REDUCTION Bilateral 05/23/2018   Procedure: INFERIOR  TURBINATE REDUCTION;  Surgeon: Margaretha Sheffield, MD;  Location: [place];  Service: ENT;  Laterality: Bilateral;   UPPER GI ENDOSCOPY  1992   Patient Active Problem List   Diagnosis Date Noted   Prediabetes 08/11/2021   Rectal bleeding    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   History of total hip replacement 03/05/2017   Dermatitis 02/12/2017   Allergic rhinitis 04/16/2016   Presence of right artificial knee joint 12/16/2015   Obesity (BMI 30.0-34.9) 11/18/2015   Persistent depressive disorder 11/18/2015   Vitamin D deficiency 11/11/2015   Mass of left lower leg 09/07/2015   Adverse drug reaction 06/13/2015   Greater trochanteric bursitis of left hip 06/05/2015   Hammer toe of right foot 06/05/2015   Neuropathy of both feet 06/05/2015   Chronic kidney disease, 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/05/2015   Lower back pain 03/10/2015   OA (osteoarthritis) of hip    GERD (gastroesophageal reflux disease)    Plantar fasciitis    Depression    Genital herpes    Bilateral hearing loss    Abnormal ECG 09/09/2014   Hip pain 09/09/2012   Other mechanical complication of other internal orthopedic device, implant, and graft 09/09/2012   Medial collateral ligament sprain of knee 11/13/2011   Other and  unspecified derangement of medial meniscus 06/13/2011   Primary localized osteoarthrosis, lower leg 06/13/2011    PCP: Delsa Grana PA-C   REFERRING PROVIDER: Delsa Grana PA-C   REFERRING DIAG: M25.551 (ICD-10-CM) - Right hip pain                      W19.XXXA,Y92.009 (ICD-10-CM) - Fall in home, initial encounter  Rationale for Evaluation and Treatment: Rehabilitation  THERAPY DIAG:  Chronic pain of left knee  Pain in right hip  ONSET DATE: 06/14/2019   SUBJECTIVE:                                                                                                                                                                                            SUBJECTIVE STATEMENT: See pertinent history   PERTINENT HISTORY:  Pt reports having left total knee done last year and she also has a left total hip done some years ago. Her left knee hurts the most and she believes that it could be causing her right sided low back pain. She has history of scoliosis. 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 Last year in December, she slipped and fell on right side which caused her right hip/low back pain as a result.   PAIN:  Are you having pain? Yes: NPRS scale: 1-2 not moving and 8 at worst /10 Pain location: Right side low back Pain description: Achy  Aggravating factors: No real pattern to the pain sometimes she feels it walking or after sitting for a long time Relieving factors: Meloxicam and extra strength tylenol   PRECAUTIONS: None  WEIGHT BEARING RESTRICTIONS: No  FALLS:  Has patient fallen in last 6 months? Yes. Number of falls 1, feels unsteady since hip replacement and she feels like she walks unsteady   LIVING ENVIRONMENT: Lives with: lives alone Lives in: House/apartment Stairs: Yes: Internal: 13 steps; on right going up and External: 3 steps; on right going up and on left going up Has following equipment at home: None  OCCUPATION: Retired   PLOF: Independent  PATIENT GOALS: She wants to feel less pain   NEXT MD VISIT: Not scheduled   OBJECTIVE:   VITALS: BP 153/82 HR 74 SpO2 100   DIAGNOSTIC FINDINGS:  Knee Imaging:  3 Views of left knee  Implants in appropriate postion at this time without any evidence of  complication.  Longstanding view shows 2 degree Varus alignment   PATIENT SURVEYS:  FOTO NT   SCREENING FOR RED FLAGS: Bowel or bladder incontinence: No Spinal tumors: No Cauda equina syndrome: No  Compression fracture: No Abdominal aneurysm: No  COGNITION: Overall cognitive status: Within functional limits for tasks assessed     SENSATION: WFL  MUSCLE  LENGTH: Hamstrings: Right 90 deg; Left 90 deg Thomas test: Negative Bilateral   Ely's Test: Negative bilateral  Ober's Test: Negative bilateral   POSTURE: No Significant postural limitations  PALPATION:       Left lateral joint line of left knee, crepitus with flexion and extension, Right glute med TTP    LUMBAR ROM:   AROM eval  Flexion 100%  Extension 100%  Right lateral flexion 100%  Left lateral flexion 100%  Right rotation 100%  Left rotation 100%   (Blank rows = not tested)  LOWER EXTREMITY ROM:       Active  Right 06/14/2022 Left 06/14/2022  Hip flexion 120 120  Hip extension    Hip abduction    Hip adduction    Hip internal rotation    Hip external rotation    Knee flexion 135 135  Knee extension 0 0  Ankle dorsiflexion    Ankle plantarflexion    Ankle inversion    Ankle eversion     (Blank rows = not tested)     LOWER EXTREMITY MMT:    MMT Right eval Left eval  Hip flexion 4+ 4  Hip extension 4- 4-  Hip abduction 4- 4-  Hip adduction 4- 4-  Hip internal rotation    Hip external rotation    Knee flexion 4 4  Knee extension 4 4  Ankle dorsiflexion 5 5  Ankle plantarflexion    Ankle inversion    Ankle eversion     (Blank rows = not tested)  LUMBAR SPECIAL TESTS:  Straight leg raise test: Negative FADIR: Negative FABER: Positive on RLE   FUNCTIONAL TESTS:  Squat: Knees beyond toes and unable to maintain upright trunk                        Single Leg Stance on RLE: RLE Hip Drop GAIT:  Distance walked: 30 ft  Assistive device utilized: None Level of assistance: Complete Independence Comments: Right hip drop in stance   TODAY'S TREATMENT:                                                                                                                              DATE:   06/14/22: Lower trunk rotation 3 x 10  Supine Bridges 1 x 10  Seated Hip Abduction 2 x 30 sec  -Pt reports not feeling stretch in right hip Seated Hip ER 2 x 30 sec       PATIENT EDUCATION:  Education details: form and technique for accurate performance of exercise  Person educated: Patient Education method: Explanation, Demonstration, Tactile cues, Verbal cues, and Handouts Education comprehension: verbalized understanding and returned demonstration  HOME EXERCISE PROGRAM: Access Code: [email or web address] URL: [email or web address] Date: 06/14/2022 Prepared by: Bradly Chris  Exercises -  Supine Lower Trunk Rotation  - 1 x daily - 3 sets - 10 reps - Supine Bridge  - 3 x weekly - 3 sets - 10 reps - Seated Hip External Rotation Stretch  - 1 x daily - 3 reps - 60 sec  hold  ASSESSMENT:  CLINICAL IMPRESSION: Patient is a 67 y.o. white female who was seen today for physical therapy evaluation and treatment for right sided hip pain and left knee pain. She has past h/o of left TKA and left THA. Lumbar radiculopathy ruled out as well as mechanical low back pain with pain localized to right glute med. Pt likely shows signs and symptoms of glute med tendinopathy with increased tenderness over right glute med as well as right hip abduction weakness. Possible PFPS with left knee with pain with increased knee flexion especially when negotiating stairs and decreased hip abduction strength. However, PFPS not fully ruled and further testing to be conducted during next session. Fall screening still needs to be conducted next session to determine pt reported instability with walking. Pt demonstrates decreased hip strength as well as right hip and left knee pain especially with weight bearing. She will benefit from skilled PT to address these deficits to return to walking for recreation and to improve balance to decrease risk of falling.  OBJECTIVE IMPAIRMENTS: Abnormal gait, decreased balance, decreased strength, obesity, and pain.   ACTIVITY LIMITATIONS: lifting, bending, sitting, standing, squatting, and stairs  PARTICIPATION LIMITATIONS: cleaning, community  activity, and school  PERSONAL FACTORS: Age, Fitness, Time since onset of injury/illness/exacerbation, and 3+ comorbidities: depression, h/o of right TKA and THA  are also affecting patient's functional outcome.   REHAB POTENTIAL: Excellent  CLINICAL DECISION MAKING: Stable/uncomplicated  EVALUATION COMPLEXITY: Low   GOALS: Goals reviewed with patient? No  SHORT TERM GOALS: Target date: 06/28/2022  Pt will be independent with HEP in order to improve strength and balance in order to decrease fall risk and improve function at home and work. Baseline: NT  Goal status: INITIAL   LONG TERM GOALS: Target date: 08/23/2022  Patient will have improved function and activity level as evidenced by an increase in FOTO score by 10 points or more.  Baseline: NT  Goal status: INITIAL  2.  Patient will improve hip strength by 1/3 MMT (ie 4- to 4) for improved LE stability to offload knee and improve left knee pain while standing and while negotiating stairs.  Baseline: Hip Flex R/L 4/4, Hip Ext 4-/4-, Hip Abd R/L 4-/4-, Hip Flex R/L 4+/4 Goal status: INITIAL  3.  Pt will decrease 5TSTS by at least 3 seconds in order to demonstrate clinically significant improvement in LE strength  Baseline: NT   Goal status: INITIAL  4.  Patient will perform >=11 chair stands in 30 sec to demonstrate age and gender matched LE endurance where she is not at risk of falling.  Baseline: NT  Goal status: INITIAL  5.  Patient will demonstrate reduced falls risk as evidenced by Dynamic Gait Index (DGI) >19/24. Baseline: NT  Goal status: INITIAL    PLAN:  PT FREQUENCY: 1-2x/week  PT DURATION: 10 weeks  PLANNED INTERVENTIONS: Therapeutic exercises, Neuromuscular re-education, Balance training, Gait training, Patient/Family education, Self Care, Joint mobilization, Joint manipulation, Aquatic Therapy, Dry Needling, Electrical stimulation, Spinal manipulation, Spinal mobilization, Cryotherapy, Moist heat,  Manual therapy, and Re-evaluation.  PLAN FOR NEXT SESSION: FOTO Knee, 5x STS, 30 sec chair stands, DGI. Progress hip and knee strengthening exercises   Bradly Chris PT, DPT  06/14/2022, 7:59 PM

## 2022-06-20 ENCOUNTER — Ambulatory Visit: Payer: Medicare Other | Admitting: Physical Therapy

## 2022-06-20 DIAGNOSIS — M25551 Pain in right hip: Secondary | ICD-10-CM

## 2022-06-20 DIAGNOSIS — G8929 Other chronic pain: Secondary | ICD-10-CM | POA: Diagnosis not present

## 2022-06-20 DIAGNOSIS — M5441 Lumbago with sciatica, right side: Secondary | ICD-10-CM | POA: Diagnosis not present

## 2022-06-20 DIAGNOSIS — Y92009 Unspecified place in unspecified non-institutional (private) residence as the place of occurrence of the external cause: Secondary | ICD-10-CM | POA: Diagnosis not present

## 2022-06-20 DIAGNOSIS — M419 Scoliosis, unspecified: Secondary | ICD-10-CM | POA: Diagnosis not present

## 2022-06-20 DIAGNOSIS — W19XXXA Unspecified fall, initial encounter: Secondary | ICD-10-CM | POA: Diagnosis not present

## 2022-06-20 DIAGNOSIS — M25562 Pain in left knee: Secondary | ICD-10-CM | POA: Diagnosis not present

## 2022-06-20 NOTE — Therapy (Signed)
OUTPATIENT PHYSICAL THERAPY TREATMENT NOTE   Patient Name: Olivia Morgan MRN: [id number] DOB:[birth year], 67 y.o., female Today's Date: 06/20/2022  PCP: Dr. Delsa Grana  REFERRING PROVIDER: Dr. Delsa Grana   END OF SESSION:   PT End of Session - 06/20/22 0933     Visit Number 2    Number of Visits 20    Date for PT Re-Evaluation 08/23/22    Authorization Type [employer] Blue Medicare    Authorization - Number of Visits 20    Progress Note Due on Visit 10    Activity Tolerance Patient tolerated treatment well    Behavior During Therapy [facility] for tasks assessed/performed             Past Medical History:  Diagnosis Date   Acute pain of left shoulder 06/08/2020   Allergic reaction 02/12/2017   Bilateral hearing loss    wears hearing aids   Depression    Genital herpes    GERD (gastroesophageal reflux disease)    Herpes simplex virus infection    Murmur    Neuropathy of both feet 06/05/2015   OA (osteoarthritis) of hip    left   Osteopenia of lumbar spine 2010   Plantar fasciitis    Stomatitis    Past Surgical History:  Procedure Laterality Date   CESAREAN SECTION     COLONOSCOPY  2010   COLONOSCOPY WITH PROPOFOL N/A 11/27/2018   Procedure: COLONOSCOPY WITH PROPOFOL;  Surgeon: Lin Landsman, MD;  Location: [place];  Service: Endoscopy;  Laterality: N/A;   ENDOSCOPIC CONCHA BULLOSA RESECTION Left 05/23/2018   Procedure: ENDOSCOPIC CONCHA BULLOSA RESECTION;  Surgeon: Margaretha Sheffield, MD;  Location: [place];  Service: ENT;  Laterality: Left;   FOOT SURGERY Right    REPLACEMENT TOTAL KNEE  3/13   Dr.Hallows at [place] Bilateral 05/23/2018   Procedure: SEPTOPLASTY;  Surgeon: Margaretha Sheffield, MD;  Location: [place];  Service: ENT;  Laterality: Bilateral;   TOTAL HIP ARTHROPLASTY Left 10/2008   TURBINATE REDUCTION Bilateral 05/23/2018   Procedure: INFERIOR TURBINATE REDUCTION;  Surgeon: Margaretha Sheffield, MD;  Location: [place];  Service: ENT;  Laterality: Bilateral;   UPPER GI ENDOSCOPY  1992   Patient Active Problem List   Diagnosis Date Noted   Prediabetes 08/11/2021   Rectal bleeding    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   History of total hip replacement 03/05/2017   Dermatitis 02/12/2017   Allergic rhinitis 04/16/2016   Presence of right artificial knee joint 12/16/2015   Obesity (BMI 30.0-34.9) 11/18/2015   Persistent depressive disorder 11/18/2015   Vitamin D deficiency 11/11/2015   Mass of left lower leg 09/07/2015   Adverse drug reaction 06/13/2015   Greater trochanteric bursitis of left hip 06/05/2015   Hammer toe of right foot 06/05/2015   Neuropathy of both feet 06/05/2015   Chronic kidney disease, 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/05/2015   Lower back pain 03/10/2015   OA (osteoarthritis) of hip    GERD (gastroesophageal reflux disease)    Plantar fasciitis    Depression    Genital herpes    Bilateral hearing loss    Abnormal ECG 09/09/2014   Hip pain 09/09/2012   Other mechanical complication of other internal orthopedic device, implant, and graft 09/09/2012   Medial collateral ligament sprain of knee 11/13/2011   Other and unspecified derangement of medial meniscus 06/13/2011   Primary localized osteoarthrosis, lower leg 06/13/2011    REFERRING  DIAG: Left knee pain and right hip pain   THERAPY DIAG:  No diagnosis found.  Rationale for Evaluation and Treatment Rehabilitation  PERTINENT HISTORY: Pt reports having left total knee done last year and she also has a left total hip done some years ago. Her left knee hurts the most and she believes that it could be causing her right sided low back pain. She has history of scoliosis. 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 Last year in December, she slipped and fell on right side which caused her right hip/low back pain as a result.    PRECAUTIONS: None    SUBJECTIVE:                                                                                                                                                                                      SUBJECTIVE STATEMENT:  Pt reports feeling increased pain in left knee and right hip after doing exercises especially the bridges.    PAIN:  Are you having pain? Yes: NPRS scale: 5/10 Pain location: Left knee and right hip  Pain description: Achy  Aggravating factors: Moving  Relieving factors: Sitting    OBJECTIVE: (objective measures completed at initial evaluation unless otherwise dated)  VITALS: BP 153/82 HR 74 SpO2 100    DIAGNOSTIC FINDINGS:  Knee Imaging:  3 Views of left knee  Implants in appropriate postion at this time without any evidence of  complication.  Longstanding view shows 2 degree Varus alignment    PATIENT SURVEYS:  FOTO 51/100   SCREENING FOR RED FLAGS: Bowel or bladder incontinence: No Spinal tumors: No Cauda equina syndrome: No Compression fracture: No Abdominal aneurysm: No   COGNITION: Overall cognitive status: Within functional limits for tasks assessed                          SENSATION: WFL   MUSCLE LENGTH: Hamstrings: Right 90 deg; Left 90 deg Thomas test: Negative Bilateral   Ely's Test: Negative bilateral  Ober's Test: Negative bilateral    POSTURE: No Significant postural limitations   PALPATION:       Left lateral joint line of left knee, crepitus with flexion and extension, Right glute med TTP     LUMBAR ROM:    AROM eval  Flexion 100%  Extension 100%  Right lateral flexion 100%  Left lateral flexion 100%  Right rotation 100%  Left rotation 100%   (Blank rows = not tested)   LOWER EXTREMITY ROM:          Active  Right 06/14/2022 Left 06/14/2022  Hip flexion 120 120  Hip extension      Hip abduction      Hip adduction      Hip internal rotation      Hip external rotation      Knee flexion 135 135  Knee extension 0 0   Ankle dorsiflexion      Ankle plantarflexion      Ankle inversion      Ankle eversion       (Blank rows = not tested)        LOWER EXTREMITY MMT:     MMT Right eval Left eval  Hip flexion 4+ 4  Hip extension 4- 4-  Hip abduction 4- 4-  Hip adduction 4- 4-  Hip internal rotation      Hip external rotation      Knee flexion 4 4  Knee extension 4 4  Ankle dorsiflexion 5 5  Ankle plantarflexion      Ankle inversion      Ankle eversion       (Blank rows = not tested)   LUMBAR SPECIAL TESTS:  Straight leg raise test: Negative FADIR: Negative FABER: Positive on RLE    FUNCTIONAL TESTS:  Squat: Knees beyond toes and unable to maintain upright trunk                        Single Leg Stance on RLE: RLE Hip Drop GAIT:  Distance walked: 30 ft  Assistive device utilized: None Level of assistance: Complete Independence Comments: Right hip drop in stance    TODAY'S TREATMENT:                                                                                                                              DATE:   06/20/22: Nu-Step seat and arms level 8 for resistance 2 for 5 min  FOTO: 51/100 30 sec chair stands = 9 reps  5 x STS: 15 sec  Mini-Squats to 30 inch mat height 2 x 10  -Pt reports increased right hip and left knee pain  Standing Hip Extension with BUE support 2 x 10     06/14/22: Lower trunk rotation 3 x 10  Supine Bridges 1 x 10  Seated Hip Abduction 2 x 30 sec  -Pt reports not feeling stretch in right hip Seated Hip ER 2 x 30 sec        PATIENT EDUCATION:  Education details: form and technique for accurate performance of exercise  Person educated: Patient Education method: Explanation, Demonstration, Tactile cues, Verbal cues, and Handouts Education comprehension: verbalized understanding and returned demonstration   HOME EXERCISE PROGRAM: Access Code: [email or web address] URL: [email or web address] Date: 06/14/2022 Prepared by: Bradly Chris    Exercises - Supine Lower Trunk Rotation  - 1 x daily - 3 sets - 10 reps - Supine Bridge  - 3 x weekly - 3 sets - 10 reps - Seated Hip External Rotation  Stretch  - 1 x daily - 3 reps - 60 sec  hold   ASSESSMENT:   CLINICAL IMPRESSION:           Pt demonstrates decreased LE strength and endurance that place her at an increased risk for falls. She continues to be limited by right hip and left knee pain that especially in positions of hip and knee flexion. Exercises modified to include standing hip extension to avoid knee extension, which patient was able to tolerate without difficulty. She will benefit from skilled PT to address these deficits to return to walking for recreation and to improve balance to decrease risk of falling.      OBJECTIVE IMPAIRMENTS: Abnormal gait, decreased balance, decreased strength, obesity, and pain.    ACTIVITY LIMITATIONS: lifting, bending, sitting, standing, squatting, and stairs   PARTICIPATION LIMITATIONS: cleaning, community activity, and school   PERSONAL FACTORS: Age, Fitness, Time since onset of injury/illness/exacerbation, and 3+ comorbidities: depression, h/o of right TKA and THA  are also affecting patient's functional outcome.    REHAB POTENTIAL: Excellent   CLINICAL DECISION MAKING: Stable/uncomplicated   EVALUATION COMPLEXITY: Low     GOALS: Goals reviewed with patient? No   SHORT TERM GOALS: Target date: 06/28/2022   Pt will be independent with HEP in order to improve strength and balance in order to decrease fall risk and improve function at home and work. Baseline: NT  Goal status: INITIAL     LONG TERM GOALS: Target date: 08/23/2022   Patient will have improved function and activity level as evidenced by an increase in FOTO score by 10 points or more.  Baseline: 51/100  Goal status: Ongoing    2.  Patient will improve hip strength by 1/3 MMT (ie 4- to 4) for improved LE stability to offload knee and improve left knee pain while  standing and while negotiating stairs.  Baseline: Hip Flex R/L 4/4, Hip Ext 4-/4-, Hip Abd R/L 4-/4-, Hip Flex R/L 4+/4 Goal status: Ongoing    3.  Pt will decrease 5TSTS by at least 3 seconds in order to demonstrate clinically significant improvement in LE strength   Baseline: 15 sec  Goal status: Ongoing    4.  Patient will perform >=11 chair stands in 30 sec to demonstrate age and gender matched LE endurance where she is not at risk of falling.  Baseline: 9 Goal status: Ongoing    5.  Patient will demonstrate reduced falls risk as evidenced by Dynamic Gait Index (DGI) >19/24. Baseline: NT  Goal status: Ongoing        PLAN:   PT FREQUENCY: 1-2x/week   PT DURATION: 10 weeks   PLANNED INTERVENTIONS: Therapeutic exercises, Neuromuscular re-education, Balance training, Gait training, Patient/Family education, Self Care, Joint mobilization, Joint manipulation, Aquatic Therapy, Dry Needling, Electrical stimulation, Spinal manipulation, Spinal mobilization, Cryotherapy, Moist heat, Manual therapy, and Re-evaluation.   PLAN FOR NEXT SESSION:  DGI. Progress hip and knee strengthening exercises. PFPS screening of left knee    Bradly Chris PT, DPT  06/20/2022, 9:42 AM

## 2022-06-22 ENCOUNTER — Ambulatory Visit: Payer: Medicare Other | Admitting: Physical Therapy

## 2022-06-26 ENCOUNTER — Ambulatory Visit: Payer: Medicare Other | Admitting: Physical Therapy

## 2022-06-26 DIAGNOSIS — M5441 Lumbago with sciatica, right side: Secondary | ICD-10-CM | POA: Diagnosis not present

## 2022-06-26 DIAGNOSIS — M419 Scoliosis, unspecified: Secondary | ICD-10-CM | POA: Diagnosis not present

## 2022-06-26 DIAGNOSIS — M25551 Pain in right hip: Secondary | ICD-10-CM

## 2022-06-26 DIAGNOSIS — W19XXXA Unspecified fall, initial encounter: Secondary | ICD-10-CM | POA: Diagnosis not present

## 2022-06-26 DIAGNOSIS — G8929 Other chronic pain: Secondary | ICD-10-CM | POA: Diagnosis not present

## 2022-06-26 DIAGNOSIS — Y92009 Unspecified place in unspecified non-institutional (private) residence as the place of occurrence of the external cause: Secondary | ICD-10-CM | POA: Diagnosis not present

## 2022-06-26 DIAGNOSIS — M25562 Pain in left knee: Secondary | ICD-10-CM | POA: Diagnosis not present

## 2022-06-26 NOTE — Therapy (Signed)
OUTPATIENT PHYSICAL THERAPY TREATMENT NOTE   Patient Name: Olivia Morgan MRN: [id number] DOB:12-07-1955, 67 y.o., female Today's Date: 06/26/2022  PCP: Dr. Delsa Grana  REFERRING PROVIDER: Dr. Delsa Grana   END OF SESSION:   PT End of Session - 06/26/22 1726     Visit Number 3    Number of Visits 20    Date for PT Re-Evaluation 08/23/22    Authorization Type [employer]    Authorization - Visit Number 3    Authorization - Number of Visits 20    Progress Note Due on Visit 10    PT Start Time [id number]    PT Stop Time [id number]    PT Time Calculation (min) 40 min    Activity Tolerance Patient tolerated treatment well    Behavior During Therapy [facility] for tasks assessed/performed              Past Medical History:  Diagnosis Date   Acute pain of left shoulder 06/08/2020   Allergic reaction 02/12/2017   Bilateral hearing loss    wears hearing aids   Depression    Genital herpes    GERD (gastroesophageal reflux disease)    Herpes simplex virus infection    Murmur    Neuropathy of both feet 06/05/2015   OA (osteoarthritis) of hip    left   Osteopenia of lumbar spine 2010   Plantar fasciitis    Stomatitis    Past Surgical History:  Procedure Laterality Date   CESAREAN SECTION     COLONOSCOPY  2010   COLONOSCOPY WITH PROPOFOL N/A 11/27/2018   Procedure: COLONOSCOPY WITH PROPOFOL;  Surgeon: Lin Landsman, MD;  Location: [place];  Service: Endoscopy;  Laterality: N/A;   ENDOSCOPIC CONCHA BULLOSA RESECTION Left 05/23/2018   Procedure: ENDOSCOPIC CONCHA BULLOSA RESECTION;  Surgeon: Margaretha Sheffield, MD;  Location: [place];  Service: ENT;  Laterality: Left;   FOOT SURGERY Right    REPLACEMENT TOTAL KNEE  3/13   Dr.Hallows at [place] Bilateral 05/23/2018   Procedure: SEPTOPLASTY;  Surgeon: Margaretha Sheffield, MD;  Location: [place];  Service: ENT;  Laterality: Bilateral;   TOTAL HIP ARTHROPLASTY Left 10/2008   TURBINATE  REDUCTION Bilateral 05/23/2018   Procedure: INFERIOR TURBINATE REDUCTION;  Surgeon: Margaretha Sheffield, MD;  Location: [place];  Service: ENT;  Laterality: Bilateral;   UPPER GI ENDOSCOPY  1992   Patient Active Problem List   Diagnosis Date Noted   Prediabetes 08/11/2021   Rectal bleeding    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   History of total hip replacement 03/05/2017   Dermatitis 02/12/2017   Allergic rhinitis 04/16/2016   Presence of right artificial knee joint 12/16/2015   Obesity (BMI 30.0-34.9) 11/18/2015   Persistent depressive disorder 11/18/2015   Vitamin D deficiency 11/11/2015   Mass of left lower leg 09/07/2015   Adverse drug reaction 06/13/2015   Greater trochanteric bursitis of left hip 06/05/2015   Hammer toe of right foot 06/05/2015   Neuropathy of both feet 06/05/2015   Chronic kidney disease, 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/05/2015   Lower back pain 03/10/2015   OA (osteoarthritis) of hip    GERD (gastroesophageal reflux disease)    Plantar fasciitis    Depression    Genital herpes    Bilateral hearing loss    Abnormal ECG 09/09/2014   Hip pain 09/09/2012   Other mechanical complication of other internal orthopedic device, implant, and graft  09/09/2012   Medial collateral ligament sprain of knee 11/13/2011   Other and unspecified derangement of medial meniscus 06/13/2011   Primary localized osteoarthrosis, lower leg 06/13/2011    REFERRING DIAG: Left knee pain and right hip pain   THERAPY DIAG:  No diagnosis found.  Rationale for Evaluation and Treatment Rehabilitation  PERTINENT HISTORY: Pt reports having left total knee done last year and she also has a left total hip done some years ago. Her left knee hurts the most and she believes that it could be causing her right sided low back pain. She has history of scoliosis. 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 Last year in  December, she slipped and fell on right side which caused her right hip/low back pain as a result.    PRECAUTIONS: None   SUBJECTIVE:                                                                                                                                                                                      SUBJECTIVE STATEMENT: Pt states that she returned from [place] trip at beach and she feels increased left knee pain. She went up and down a number of stairs.   PAIN:  Are you having pain? Yes: NPRS scale: 6-7/10 Pain location: Left knee and right hip  Pain description: Achy  Aggravating factors: Moving  Relieving factors: Sitting    OBJECTIVE: (objective measures completed at initial evaluation unless otherwise dated)  VITALS: BP 153/82 HR 74 SpO2 100    DIAGNOSTIC FINDINGS:  Knee Imaging:  3 Views of left knee  Implants in appropriate postion at this time without any evidence of  complication.  Longstanding view shows 2 degree Varus alignment    PATIENT SURVEYS:  FOTO 51/100   SCREENING FOR RED FLAGS: Bowel or bladder incontinence: No Spinal tumors: No Cauda equina syndrome: No Compression fracture: No Abdominal aneurysm: No   COGNITION: Overall cognitive status: Within functional limits for tasks assessed                          SENSATION: WFL   MUSCLE LENGTH: Hamstrings: Right 90 deg; Left 90 deg Thomas test: Negative Bilateral   Ely's Test: Negative bilateral  Ober's Test: Negative bilateral    POSTURE: No Significant postural limitations   PALPATION:       Left lateral joint line of left knee, crepitus with flexion and extension, Right glute med TTP     LUMBAR ROM:    AROM eval  Flexion 100%  Extension 100%  Right lateral flexion 100%  Left  lateral flexion 100%  Right rotation 100%  Left rotation 100%   (Blank rows = not tested)   LOWER EXTREMITY ROM:          Active  Right 06/14/2022 Left 06/14/2022  Hip flexion 120 120  Hip extension       Hip abduction      Hip adduction      Hip internal rotation      Hip external rotation      Knee flexion 135 135  Knee extension 0 0  Ankle dorsiflexion      Ankle plantarflexion      Ankle inversion      Ankle eversion       (Blank rows = not tested)        LOWER EXTREMITY MMT:     MMT Right eval Left eval  Hip flexion 4+ 4  Hip extension 4- 4-  Hip abduction 4- 4-  Hip adduction 4- 4-  Hip internal rotation      Hip external rotation      Knee flexion 4 4  Knee extension 4 4  Ankle dorsiflexion 5 5  Ankle plantarflexion      Ankle inversion      Ankle eversion       (Blank rows = not tested)   LUMBAR SPECIAL TESTS:  Straight leg raise test: Negative FADIR: Negative FABER: Positive on RLE    FUNCTIONAL TESTS:  Squat: Knees beyond toes and unable to maintain upright trunk                        Single Leg Stance on RLE: RLE Hip Drop GAIT:  Distance walked: 30 ft  Assistive device utilized: None Level of assistance: Complete Independence Comments: Right hip drop in stance    TODAY'S TREATMENT:                                                                                                                              DATE:   06/26/22: Nu-Step seat and arms level 9 for resistance 2 for 5 min DGI: 21/24  -Mild Impairment: Stairs, vertical head turn, and obstacles  Standing Hip Abduction with BUE support 3 x 10     06/20/22: Nu-Step seat and arms level 8 for resistance 2 for 5 min  FOTO: 51/100 30 sec chair stands = 9 reps  5 x STS: 15 sec  Mini-Squats to 30 inch mat height 2 x 10  -Pt reports increased right hip and left knee pain  Standing Hip Extension with BUE support 2 x 10     06/14/22: Lower trunk rotation 3 x 10  Supine Bridges 1 x 10  Seated Hip Abduction 2 x 30 sec  -Pt reports not feeling stretch in right hip Seated Hip ER 2 x 30 sec        PATIENT EDUCATION:  Education details: form and technique for accurate performance of  exercise   Person educated: Patient Education method: Explanation, Demonstration, Tactile cues, Verbal cues, and Handouts Education comprehension: verbalized understanding and returned demonstration   HOME EXERCISE PROGRAM: Access Code: [email or web address] URL: [email or web address] Date: 06/26/2022 Prepared by: Bradly Chris  Exercises - Supine Lower Trunk Rotation  - 1 x daily - 3 sets - 10 reps - Bound Angle Hands Forward   - 1 x daily - 3 reps - 60 sec  hold - Seated Hip External Rotation Stretch  - 1 x daily - 3 reps - 60 sec  hold - Standing Hip Extension with Counter Support  - 3 x weekly - 3 sets - 10 reps - Standing Hip Abduction with Counter Support  - 3 x weekly - 3 sets - 10 reps - Standing March with Counter Support  - 3 x weekly - 3 sets - 10 reps   ASSESSMENT:   CLINICAL IMPRESSION: Pt does not present at an increased risk for falls with dynamic balance. She does show mild deficits with stepping over obstacles, vertical head turns, and negotiating stairs. She did feel increased left knee pain with marches, but exercise modified to include decreased left knee flexion. She was able to complete marches with this modification and she was able to tolerate all of the other exercises without increased pain or discomfort. She will continue to benefit from skilled PT to address these deficits to return to walking for recreation and to improve balance to decrease risk of falling.     OBJECTIVE IMPAIRMENTS: Abnormal gait, decreased balance, decreased strength, obesity, and pain.    ACTIVITY LIMITATIONS: lifting, bending, sitting, standing, squatting, and stairs   PARTICIPATION LIMITATIONS: cleaning, community activity, and school   PERSONAL FACTORS: Age, Fitness, Time since onset of injury/illness/exacerbation, and 3+ comorbidities: depression, h/o of right TKA and THA  are also affecting patient's functional outcome.    REHAB POTENTIAL: Excellent   CLINICAL DECISION MAKING:  Stable/uncomplicated   EVALUATION COMPLEXITY: Low     GOALS: Goals reviewed with patient? No   SHORT TERM GOALS: Target date: 06/28/2022   Pt will be independent with HEP in order to improve strength and balance in order to decrease fall risk and improve function at home and work. Baseline:Performing exercises independently  Goal status: Ongoing      LONG TERM GOALS: Target date: 08/23/2022   Patient will have improved function and activity level as evidenced by an increase in FOTO score by 10 points or more.  Baseline: 51/100  Goal status: Ongoing    2.  Patient will improve hip strength by 1/3 MMT (ie 4- to 4) for improved LE stability to offload knee and improve left knee pain while standing and while negotiating stairs.  Baseline: Hip Flex R/L 4/4, Hip Ext 4-/4-, Hip Abd R/L 4-/4-, Hip Flex R/L 4+/4 Goal status: Ongoing    3.  Pt will decrease 5TSTS by at least 3 seconds in order to demonstrate clinically significant improvement in LE strength   Baseline: 15 sec  Goal status: Ongoing    4.  Patient will perform >=11 chair stands in 30 sec to demonstrate age and gender matched LE endurance where she is not at risk of falling.  Baseline: 9 Goal status: Ongoing    5.  Patient will demonstrate reduced falls risk as evidenced by Dynamic Gait Index (DGI) >19/24. Baseline: NT  Goal status: Ongoing        PLAN:   PT FREQUENCY: 1-2x/week   PT DURATION: 10 weeks   PLANNED  INTERVENTIONS: Therapeutic exercises, Neuromuscular re-education, Balance training, Gait training, Patient/Family education, Self Care, Joint mobilization, Joint manipulation, Aquatic Therapy, Dry Needling, Electrical stimulation, Spinal manipulation, Spinal mobilization, Cryotherapy, Moist heat, Manual therapy, and Re-evaluation.   PLAN FOR NEXT SESSION:    Soft tissue of right side of low back and right hip for trigger point release and further examination of left knee with corresponding exercises to  strengthen without pain exacerbation.    Bradly Chris PT, DPT  06/26/2022, 5:26 PM

## 2022-06-28 ENCOUNTER — Ambulatory Visit: Payer: Medicare Other | Admitting: Physical Therapy

## 2022-06-28 ENCOUNTER — Encounter: Payer: Self-pay | Admitting: Physical Therapy

## 2022-06-28 DIAGNOSIS — M25551 Pain in right hip: Secondary | ICD-10-CM | POA: Diagnosis not present

## 2022-06-28 DIAGNOSIS — Y92009 Unspecified place in unspecified non-institutional (private) residence as the place of occurrence of the external cause: Secondary | ICD-10-CM | POA: Diagnosis not present

## 2022-06-28 DIAGNOSIS — M25562 Pain in left knee: Secondary | ICD-10-CM | POA: Diagnosis not present

## 2022-06-28 DIAGNOSIS — M419 Scoliosis, unspecified: Secondary | ICD-10-CM | POA: Diagnosis not present

## 2022-06-28 DIAGNOSIS — M5441 Lumbago with sciatica, right side: Secondary | ICD-10-CM | POA: Diagnosis not present

## 2022-06-28 DIAGNOSIS — W19XXXA Unspecified fall, initial encounter: Secondary | ICD-10-CM | POA: Diagnosis not present

## 2022-06-28 DIAGNOSIS — G8929 Other chronic pain: Secondary | ICD-10-CM

## 2022-06-28 NOTE — Therapy (Signed)
OUTPATIENT PHYSICAL THERAPY TREATMENT NOTE   Patient Name: Olivia Morgan MRN: [id number] DOB:23-Aug-1955, 67 y.o., female Today's Date: 06/28/2022  PCP: Dr. Delsa Grana  REFERRING PROVIDER: Dr. Delsa Grana   END OF SESSION:   PT End of Session - 06/28/22 1512     Visit Number 4    Number of Visits 20    Date for PT Re-Evaluation 08/23/22    Authorization Type [employer]    Authorization - Visit Number 4    Authorization - Number of Visits 20    Progress Note Due on Visit 10    PT Start Time [id number]    PT Stop Time [id number]    PT Time Calculation (min) 40 min    Activity Tolerance Patient tolerated treatment well    Behavior During Therapy [facility] for tasks assessed/performed              Past Medical History:  Diagnosis Date   Acute pain of left shoulder 06/08/2020   Allergic reaction 02/12/2017   Bilateral hearing loss    wears hearing aids   Depression    Genital herpes    GERD (gastroesophageal reflux disease)    Herpes simplex virus infection    Murmur    Neuropathy of both feet 06/05/2015   OA (osteoarthritis) of hip    left   Osteopenia of lumbar spine 2010   Plantar fasciitis    Stomatitis    Past Surgical History:  Procedure Laterality Date   CESAREAN SECTION     COLONOSCOPY  2010   COLONOSCOPY WITH PROPOFOL N/A 11/27/2018   Procedure: COLONOSCOPY WITH PROPOFOL;  Surgeon: Lin Landsman, MD;  Location: [place];  Service: Endoscopy;  Laterality: N/A;   ENDOSCOPIC CONCHA BULLOSA RESECTION Left 05/23/2018   Procedure: ENDOSCOPIC CONCHA BULLOSA RESECTION;  Surgeon: Margaretha Sheffield, MD;  Location: [place];  Service: ENT;  Laterality: Left;   FOOT SURGERY Right    REPLACEMENT TOTAL KNEE  3/13   Dr.Hallows at [place] Bilateral 05/23/2018   Procedure: SEPTOPLASTY;  Surgeon: Margaretha Sheffield, MD;  Location: [place];  Service: ENT;  Laterality: Bilateral;   TOTAL HIP ARTHROPLASTY Left 10/2008   TURBINATE  REDUCTION Bilateral 05/23/2018   Procedure: INFERIOR TURBINATE REDUCTION;  Surgeon: Margaretha Sheffield, MD;  Location: [place];  Service: ENT;  Laterality: Bilateral;   UPPER GI ENDOSCOPY  1992   Patient Active Problem List   Diagnosis Date Noted   Prediabetes 08/11/2021   Rectal bleeding    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   History of total hip replacement 03/05/2017   Dermatitis 02/12/2017   Allergic rhinitis 04/16/2016   Presence of right artificial knee joint 12/16/2015   Obesity (BMI 30.0-34.9) 11/18/2015   Persistent depressive disorder 11/18/2015   Vitamin D deficiency 11/11/2015   Mass of left lower leg 09/07/2015   Adverse drug reaction 06/13/2015   Greater trochanteric bursitis of left hip 06/05/2015   Hammer toe of right foot 06/05/2015   Neuropathy of both feet 06/05/2015   Chronic kidney disease, 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/05/2015   Lower back pain 03/10/2015   OA (osteoarthritis) of hip    GERD (gastroesophageal reflux disease)    Plantar fasciitis    Depression    Genital herpes    Bilateral hearing loss    Abnormal ECG 09/09/2014   Hip pain 09/09/2012   Other mechanical complication of other internal orthopedic device, implant, and graft  09/09/2012   Medial collateral ligament sprain of knee 11/13/2011   Other and unspecified derangement of medial meniscus 06/13/2011   Primary localized osteoarthrosis, lower leg 06/13/2011    REFERRING DIAG: Left knee pain and right hip pain   THERAPY DIAG:  Chronic pain of left knee  Pain in right hip  Rationale for Evaluation and Treatment Rehabilitation  PERTINENT HISTORY: Pt reports having left total knee done last year and she also has a left total hip done some years ago. Her left knee hurts the most and she believes that it could be causing her right sided low back pain. She has history of scoliosis. 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 Last year in December, she slipped and fell on right side which caused her right hip/low back pain as a result.    PRECAUTIONS: None   SUBJECTIVE:                                                                                                                                                                                      SUBJECTIVE STATEMENT: Pt reports that she is feeling the same amount of pain she had at her initial eval. She mentions having bilateral knee replacements and that her left knee has not felt the same since her knee replacement. She also has had her left hip replaced.  PAIN:  Are you having pain? Yes: NPRS scale: 6-7/10 Pain location: Left knee and right hip  Pain description: Achy  Aggravating factors: Moving  Relieving factors: Sitting    OBJECTIVE: (objective measures completed at initial evaluation unless otherwise dated)  VITALS: BP 153/82 HR 74 SpO2 100    DIAGNOSTIC FINDINGS:  Knee Imaging:  3 Views of left knee  Implants in appropriate postion at this time without any evidence of  complication.  Longstanding view shows 2 degree Varus alignment    PATIENT SURVEYS:  FOTO 51/100   SCREENING FOR RED FLAGS: Bowel or bladder incontinence: No Spinal tumors: No Cauda equina syndrome: No Compression fracture: No Abdominal aneurysm: No   COGNITION: Overall cognitive status: Within functional limits for tasks assessed                          SENSATION: WFL   MUSCLE LENGTH: Hamstrings: Right 90 deg; Left 90 deg Thomas test: Negative Bilateral   Ely's Test: Negative bilateral  Ober's Test: Negative bilateral    POSTURE: No Significant postural limitations   PALPATION:       Left lateral joint line of left knee, crepitus with flexion and extension, Right glute med  TTP     LUMBAR ROM:    AROM eval  Flexion 100%  Extension 100%  Right lateral flexion 100%  Left lateral flexion 100%  Right rotation 100%  Left rotation 100%   (Blank  rows = not tested)   LOWER EXTREMITY ROM:          Active  Right 06/14/2022 Left 06/14/2022  Hip flexion 120 120  Hip extension      Hip abduction      Hip adduction      Hip internal rotation      Hip external rotation      Knee flexion 135 135  Knee extension 0 0  Ankle dorsiflexion      Ankle plantarflexion      Ankle inversion      Ankle eversion       (Blank rows = not tested)        LOWER EXTREMITY MMT:     MMT Right eval Left eval  Hip flexion 4+ 4  Hip extension 4- 4-  Hip abduction 4- 4-  Hip adduction 4- 4-  Hip internal rotation      Hip external rotation      Knee flexion 4 4  Knee extension 4 4  Ankle dorsiflexion 5 5  Ankle plantarflexion      Ankle inversion      Ankle eversion       (Blank rows = not tested)   LUMBAR SPECIAL TESTS:  Straight leg raise test: Negative FADIR: Negative FABER: Positive on RLE    FUNCTIONAL TESTS:  Squat: Knees beyond toes and unable to maintain upright trunk                        Single Leg Stance on RLE: RLE Hip Drop GAIT:  Distance walked: 30 ft  Assistive device utilized: None Level of assistance: Complete Independence Comments: Right hip drop in stance    TODAY'S TREATMENT:                                                                                                                              DATE:   06/28/22:  THEREX  Nu-Step seat and arms level 6 for resistance 2 for 5 min  TFL Hip Raises on RLE 3 x 10   Right Glute Med Trigger Point Release unable to relieve and no palpable trigger point over are pt reported as painful  Hip Scour Test: Negative on RLE   MANUAL Stretches led by PT   Supine ER Stretch on RLE 2 x 30 sec  Supine Hip Abduction on RLE 2 x 30 sec  Supine HS Stretch on RLE 2 x 30 sec   06/26/22: Nu-Step seat and arms level 9 for resistance 2 for 5 min DGI: 21/24  -Mild Impairment: Stairs, vertical head turn, and obstacles  Standing Hip Abduction with BUE support 3 x 10    06/20/22:  Nu-Step seat and arms level 8 for resistance 2 for 5 min  FOTO: 51/100 30 sec chair stands = 9 reps  5 x STS: 15 sec  Mini-Squats to 30 inch mat height 2 x 10  -Pt reports increased right hip and left knee pain  Standing Hip Extension with BUE support 2 x 10     06/14/22: Lower trunk rotation 3 x 10  Supine Bridges 1 x 10  Seated Hip Abduction 2 x 30 sec  -Pt reports not feeling stretch in right hip Seated Hip ER 2 x 30 sec        PATIENT EDUCATION:  Education details: form and technique for accurate performance of exercise  Person educated: Patient Education method: Explanation, Demonstration, Tactile cues, Verbal cues, and Handouts Education comprehension: verbalized understanding and returned demonstration   HOME EXERCISE PROGRAM: Access Code: [email or web address] URL: [email or web address] Date: 06/28/2022 Prepared by: Bradly Chris  Exercises - Supine Lower Trunk Rotation  - 1 x daily - 3 sets - 10 reps - Bound Angle Hands Forward   - 1 x daily - 3 reps - 60 sec  hold - Seated Hip External Rotation Stretch  - 1 x daily - 3 reps - 60 sec  hold - Standing Hip Extension with Counter Support  - 3 x weekly - 3 sets - 10 reps - Standing Hip Abduction with Counter Support  - 3 x weekly - 3 sets - 10 reps - Standing March with Counter Support  - 3 x weekly - 3 sets - 10 reps - Sidelying ITB Stretch off Table  - 1 x daily - 3 reps - 60 sec  hold  ASSESSMENT:   CLINICAL IMPRESSION:  Continued to rule in and rule out pathologies for right hip with right hip arthritis further ruled out as well as glute med. Potential for increased low back tightness or TFL weakness with increased relief of pain with side lying TFL stretch and low back knee to chest stretch. Pt able to perform all exercises without an increase in her right hip pain or left knee pain. She will continue to benefit from skilled PT to address these deficits to return to walking for recreation and to  improve balance to decrease risk of falling.    OBJECTIVE IMPAIRMENTS: Abnormal gait, decreased balance, decreased strength, obesity, and pain.    ACTIVITY LIMITATIONS: lifting, bending, sitting, standing, squatting, and stairs   PARTICIPATION LIMITATIONS: cleaning, community activity, and school   PERSONAL FACTORS: Age, Fitness, Time since onset of injury/illness/exacerbation, and 3+ comorbidities: depression, h/o of right TKA and THA  are also affecting patient's functional outcome.    REHAB POTENTIAL: Excellent   CLINICAL DECISION MAKING: Stable/uncomplicated   EVALUATION COMPLEXITY: Low     GOALS: Goals reviewed with patient? No   SHORT TERM GOALS: Target date: 06/28/2022   Pt will be independent with HEP in order to improve strength and balance in order to decrease fall risk and improve function at home and work. Baseline:Performing exercises independently  Goal status: Ongoing      LONG TERM GOALS: Target date: 08/23/2022   Patient will have improved function and activity level as evidenced by an increase in FOTO score by 10 points or more.  Baseline: 51/100  Goal status: Ongoing    2.  Patient will improve hip strength by 1/3 MMT (ie 4- to 4) for improved LE stability to offload knee and improve left knee pain while standing and while negotiating stairs.  Baseline: Hip Flex R/L  4/4, Hip Ext 4-/4-, Hip Abd R/L 4-/4-, Hip Flex R/L 4+/4 Goal status: Ongoing    3.  Pt will decrease 5TSTS by at least 3 seconds in order to demonstrate clinically significant improvement in LE strength   Baseline: 15 sec  Goal status: Ongoing    4.  Patient will perform >=11 chair stands in 30 sec to demonstrate age and gender matched LE endurance where she is not at risk of falling.  Baseline: 9 Goal status: Ongoing    5.  Patient will demonstrate reduced falls risk as evidenced by Dynamic Gait Index (DGI) >19/24. Baseline: 21 Goal status: Deferred       PLAN:   PT FREQUENCY:  1-2x/week   PT DURATION: 10 weeks   PLANNED INTERVENTIONS: Therapeutic exercises, Neuromuscular re-education, Balance training, Gait training, Patient/Family education, Self Care, Joint mobilization, Joint manipulation, Aquatic Therapy, Dry Needling, Electrical stimulation, Spinal manipulation, Spinal mobilization, Cryotherapy, Moist heat, Manual therapy, and Re-evaluation.   PLAN FOR NEXT SESSION:   Further progress hip and knee strengthening: Sharion Settler, etc   Bradly Chris PT, DPT  06/28/2022, 3:13 PM

## 2022-07-04 ENCOUNTER — Ambulatory Visit: Payer: Medicare Other | Admitting: Physical Therapy

## 2022-07-04 DIAGNOSIS — Y92009 Unspecified place in unspecified non-institutional (private) residence as the place of occurrence of the external cause: Secondary | ICD-10-CM | POA: Diagnosis not present

## 2022-07-04 DIAGNOSIS — M25562 Pain in left knee: Secondary | ICD-10-CM | POA: Diagnosis not present

## 2022-07-04 DIAGNOSIS — W19XXXA Unspecified fall, initial encounter: Secondary | ICD-10-CM | POA: Diagnosis not present

## 2022-07-04 DIAGNOSIS — M5441 Lumbago with sciatica, right side: Secondary | ICD-10-CM | POA: Diagnosis not present

## 2022-07-04 DIAGNOSIS — M25551 Pain in right hip: Secondary | ICD-10-CM

## 2022-07-04 DIAGNOSIS — G8929 Other chronic pain: Secondary | ICD-10-CM | POA: Diagnosis not present

## 2022-07-04 DIAGNOSIS — M419 Scoliosis, unspecified: Secondary | ICD-10-CM | POA: Diagnosis not present

## 2022-07-04 NOTE — Therapy (Signed)
OUTPATIENT PHYSICAL THERAPY TREATMENT NOTE   Patient Name: Olivia Morgan MRN: [id number] DOB:May 29, 1955, 67 y.o., female Today's Date: 07/04/2022  PCP: Dr. Delsa Grana  REFERRING PROVIDER: Dr. Delsa Grana   END OF SESSION:   PT End of Session - 07/04/22 1423     Visit Number 5    Number of Visits 20    Date for PT Re-Evaluation 08/23/22    Authorization Type [employer]    Authorization - Visit Number 5    Authorization - Number of Visits 20    Progress Note Due on Visit 10    PT Start Time [id number]    PT Stop Time 1500    PT Time Calculation (min) 40 min    Activity Tolerance Patient tolerated treatment well    Behavior During Therapy [facility] for tasks assessed/performed              Past Medical History:  Diagnosis Date   Acute pain of left shoulder 06/08/2020   Allergic reaction 02/12/2017   Bilateral hearing loss    wears hearing aids   Depression    Genital herpes    GERD (gastroesophageal reflux disease)    Herpes simplex virus infection    Murmur    Neuropathy of both feet 06/05/2015   OA (osteoarthritis) of hip    left   Osteopenia of lumbar spine 2010   Plantar fasciitis    Stomatitis    Past Surgical History:  Procedure Laterality Date   CESAREAN SECTION     COLONOSCOPY  2010   COLONOSCOPY WITH PROPOFOL N/A 11/27/2018   Procedure: COLONOSCOPY WITH PROPOFOL;  Surgeon: Lin Landsman, MD;  Location: [place];  Service: Endoscopy;  Laterality: N/A;   ENDOSCOPIC CONCHA BULLOSA RESECTION Left 05/23/2018   Procedure: ENDOSCOPIC CONCHA BULLOSA RESECTION;  Surgeon: Margaretha Sheffield, MD;  Location: [place];  Service: ENT;  Laterality: Left;   FOOT SURGERY Right    REPLACEMENT TOTAL KNEE  3/13   Dr.Hallows at [place] Bilateral 05/23/2018   Procedure: SEPTOPLASTY;  Surgeon: Margaretha Sheffield, MD;  Location: [place];  Service: ENT;  Laterality: Bilateral;   TOTAL HIP ARTHROPLASTY Left 10/2008   TURBINATE  REDUCTION Bilateral 05/23/2018   Procedure: INFERIOR TURBINATE REDUCTION;  Surgeon: Margaretha Sheffield, MD;  Location: [place];  Service: ENT;  Laterality: Bilateral;   UPPER GI ENDOSCOPY  1992   Patient Active Problem List   Diagnosis Date Noted   Prediabetes 08/11/2021   Rectal bleeding    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   History of total hip replacement 03/05/2017   Dermatitis 02/12/2017   Allergic rhinitis 04/16/2016   Presence of right artificial knee joint 12/16/2015   Obesity (BMI 30.0-34.9) 11/18/2015   Persistent depressive disorder 11/18/2015   Vitamin D deficiency 11/11/2015   Mass of left lower leg 09/07/2015   Adverse drug reaction 06/13/2015   Greater trochanteric bursitis of left hip 06/05/2015   Hammer toe of right foot 06/05/2015   Neuropathy of both feet 06/05/2015   Chronic kidney disease, 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/05/2015   Lower back pain 03/10/2015   OA (osteoarthritis) of hip    GERD (gastroesophageal reflux disease)    Plantar fasciitis    Depression    Genital herpes    Bilateral hearing loss    Abnormal ECG 09/09/2014   Hip pain 09/09/2012   Other mechanical complication of other internal orthopedic device, implant, and graft  09/09/2012   Medial collateral ligament sprain of knee 11/13/2011   Other and unspecified derangement of medial meniscus 06/13/2011   Primary localized osteoarthrosis, lower leg 06/13/2011    REFERRING DIAG: Left knee pain and right hip pain   THERAPY DIAG:  Chronic pain of left knee  Pain in right hip  Rationale for Evaluation and Treatment Rehabilitation  PERTINENT HISTORY: Pt reports having left total knee done last year and she also has a left total hip done some years ago. Her left knee hurts the most and she believes that it could be causing her right sided low back pain. She has history of scoliosis. 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 Last year in December, she slipped and fell on right side which caused her right hip/low back pain as a result.    PRECAUTIONS: None   SUBJECTIVE:                                                                                                                                                                                      SUBJECTIVE STATEMENT: Pt states that she unexpectedly felt relief in her right hip after dancing this past weekend. The pain has since returned and she is not sure what caused relief.    PAIN:  Are you having pain? Yes: NPRS scale: 4-6/10 Pain location: Left knee and right hip  Pain description: Achy  Aggravating factors: Moving  Relieving factors: Sitting    OBJECTIVE: (objective measures completed at initial evaluation unless otherwise dated)  VITALS: BP 153/82 HR 74 SpO2 100    DIAGNOSTIC FINDINGS:  Knee Imaging:  3 Views of left knee  Implants in appropriate postion at this time without any evidence of  complication.  Longstanding view shows 2 degree Varus alignment    PATIENT SURVEYS:  FOTO 51/100   SCREENING FOR RED FLAGS: Bowel or bladder incontinence: No Spinal tumors: No Cauda equina syndrome: No Compression fracture: No Abdominal aneurysm: No   COGNITION: Overall cognitive status: Within functional limits for tasks assessed                          SENSATION: WFL   MUSCLE LENGTH: Hamstrings: Right 90 deg; Left 90 deg Thomas test: Negative Bilateral   Ely's Test: Negative bilateral  Ober's Test: Negative bilateral    POSTURE: No Significant postural limitations   PALPATION:       Left lateral joint line of left knee, crepitus with flexion and extension, Right glute med TTP     LUMBAR ROM:    AROM eval  Flexion  100%  Extension 100%  Right lateral flexion 100%  Left lateral flexion 100%  Right rotation 100%  Left rotation 100%   (Blank rows = not tested)   LOWER EXTREMITY ROM:          Active  Right 06/14/2022  Left 06/14/2022  Hip flexion 120 120  Hip extension      Hip abduction      Hip adduction      Hip internal rotation      Hip external rotation      Knee flexion 135 135  Knee extension 0 0  Ankle dorsiflexion      Ankle plantarflexion      Ankle inversion      Ankle eversion       (Blank rows = not tested)        LOWER EXTREMITY MMT:     MMT Right eval Left eval  Hip flexion 4+ 4  Hip extension 4- 4-  Hip abduction 4- 4-  Hip adduction 4- 4-  Hip internal rotation      Hip external rotation      Knee flexion 4 4  Knee extension 4 4  Ankle dorsiflexion 5 5  Ankle plantarflexion      Ankle inversion      Ankle eversion       (Blank rows = not tested)   LUMBAR SPECIAL TESTS:  Straight leg raise test: Negative FADIR: Negative FABER: Positive on RLE    FUNCTIONAL TESTS:  Squat: Knees beyond toes and unable to maintain upright trunk                        Single Leg Stance on RLE: RLE Hip Drop GAIT:  Distance walked: 30 ft  Assistive device utilized: None Level of assistance: Complete Independence Comments: Right hip drop in stance    TODAY'S TREATMENT:                                                                                                                              DATE:   07/04/22: Nu-step seat and arms level 8 and level 2 for resistance for 5 min  Standing Hip Abduction with BUE support 3 x 10  Standing Hip Extension with BUE support 3 x 10  TFL Hip Raise on RLE 3 x 10  Supine Bridges 3 x 10  Single leg stance 2 x 10 sec  -Pt unable to maintain balance without UE support  Semi-tandem 2 x 30 sec  Semi-tandem with horizontal head turns 2 x 10   06/28/22:  THEREX  Nu-Step seat and arms level 6 for resistance 2 for 5 min  TFL Hip Raises on RLE 3 x 10   Right Glute Med Trigger Point Release unable to relieve and no palpable trigger point over are pt reported as painful  Hip Scour Test: Negative on RLE   MANUAL Stretches led by PT  Supine ER  Stretch on RLE 2 x 30 sec  Supine Hip Abduction on RLE 2 x 30 sec  Supine HS Stretch on RLE 2 x 30 sec   06/26/22: Nu-Step seat and arms level 9 for resistance 2 for 5 min DGI: 21/24  -Mild Impairment: Stairs, vertical head turn, and obstacles  Standing Hip Abduction with BUE support 3 x 10   06/20/22: Nu-Step seat and arms level 8 for resistance 2 for 5 min  FOTO: 51/100 30 sec chair stands = 9 reps  5 x STS: 15 sec  Mini-Squats to 30 inch mat height 2 x 10  -Pt reports increased right hip and left knee pain  Standing Hip Extension with BUE support 2 x 10     PATIENT EDUCATION:  Education details: form and technique for accurate performance of exercise  Person educated: Patient Education method: Explanation, Demonstration, Tactile cues, Verbal cues, and Handouts Education comprehension: verbalized understanding and returned demonstration   HOME EXERCISE PROGRAM: Access Code: [email or web address] URL: [email or web address] Date: 07/04/2022 Prepared by: Bradly Chris  Exercises - Supine Lower Trunk Rotation  - 1 x daily - 3 sets - 10 reps - Bound Angle Hands Forward   - 1 x daily - 3 reps - 60 sec  hold - Seated Hip External Rotation Stretch  - 1 x daily - 3 reps - 60 sec  hold - Standing Hip Extension with Counter Support  - 3 x weekly - 3 sets - 10 reps - Standing Hip Abduction with Counter Support  - 3 x weekly - 3 sets - 10 reps - Standing March with Counter Support  - 3 x weekly - 3 sets - 10 reps - Sidelying ITB Stretch off Table  - 1 x daily - 3 reps - 60 sec  hold - Half Tandem Stance Balance with Head Rotation  - 3 x weekly - 3 sets - 10 reps  ASSESSMENT:   CLINICAL IMPRESSION: Pt shows improvement in exercise tolerance with only slight increase in left knee pain and no increase in right side low back/hip pain after completing exercises during session. It is unclear about why pt shows decreased pain response. She will continue to benefit from skilled PT to  address these deficits to return to walking for recreation and to improve balance to decrease risk of falling.    OBJECTIVE IMPAIRMENTS: Abnormal gait, decreased balance, decreased strength, obesity, and pain.    ACTIVITY LIMITATIONS: lifting, bending, sitting, standing, squatting, and stairs   PARTICIPATION LIMITATIONS: cleaning, community activity, and school   PERSONAL FACTORS: Age, Fitness, Time since onset of injury/illness/exacerbation, and 3+ comorbidities: depression, h/o of right TKA and THA  are also affecting patient's functional outcome.    REHAB POTENTIAL: Excellent   CLINICAL DECISION MAKING: Stable/uncomplicated   EVALUATION COMPLEXITY: Low     GOALS: Goals reviewed with patient? No   SHORT TERM GOALS: Target date: 06/28/2022   Pt will be independent with HEP in order to improve strength and balance in order to decrease fall risk and improve function at home and work. Baseline:Performing exercises independently  Goal status: Ongoing      LONG TERM GOALS: Target date: 08/23/2022   Patient will have improved function and activity level as evidenced by an increase in FOTO score by 10 points or more.  Baseline: 51/100  Goal status: Ongoing    2.  Patient will improve hip strength by 1/3 MMT (ie 4- to 4) for improved LE stability to offload knee and improve  left knee pain while standing and while negotiating stairs.  Baseline: Hip Flex R/L 4/4, Hip Ext 4-/4-, Hip Abd R/L 4-/4-, Hip Flex R/L 4+/4 Goal status: Ongoing    3.  Pt will decrease 5TSTS by at least 3 seconds in order to demonstrate clinically significant improvement in LE strength   Baseline: 15 sec  Goal status: Ongoing    4.  Patient will perform >=11 chair stands in 30 sec to demonstrate age and gender matched LE endurance where she is not at risk of falling.  Baseline: 9 Goal status: Ongoing    5.  Patient will demonstrate reduced falls risk as evidenced by Dynamic Gait Index (DGI)  >19/24. Baseline: 21 Goal status: Deferred       PLAN:   PT FREQUENCY: 1-2x/week   PT DURATION: 10 weeks   PLANNED INTERVENTIONS: Therapeutic exercises, Neuromuscular re-education, Balance training, Gait training, Patient/Family education, Self Care, Joint mobilization, Joint manipulation, Aquatic Therapy, Dry Needling, Electrical stimulation, Spinal manipulation, Spinal mobilization, Cryotherapy, Moist heat, Manual therapy, and Re-evaluation.   PLAN FOR NEXT SESSION:   Further progress hip and knee strengthening: Erling Cruz, OMEGA knee extension, semi-tandem with vertical head turns   Bradly Chris PT, DPT  07/04/2022, 2:27 PM

## 2022-07-06 ENCOUNTER — Ambulatory Visit: Payer: Medicare Other | Admitting: Physical Therapy

## 2022-07-11 ENCOUNTER — Encounter: Payer: Medicare Other | Admitting: Physical Therapy

## 2022-07-11 DIAGNOSIS — K080 Exfoliation of teeth due to systemic causes: Secondary | ICD-10-CM | POA: Diagnosis not present

## 2022-07-13 ENCOUNTER — Ambulatory Visit: Payer: Medicare Other | Attending: Family Medicine | Admitting: Physical Therapy

## 2022-07-13 DIAGNOSIS — M25562 Pain in left knee: Secondary | ICD-10-CM | POA: Insufficient documentation

## 2022-07-13 DIAGNOSIS — M25551 Pain in right hip: Secondary | ICD-10-CM | POA: Insufficient documentation

## 2022-07-13 DIAGNOSIS — G8929 Other chronic pain: Secondary | ICD-10-CM | POA: Diagnosis not present

## 2022-07-13 NOTE — Therapy (Signed)
OUTPATIENT PHYSICAL THERAPY TREATMENT NOTE   Patient Name: Olivia Morgan MRN: [id number] DOB:[birth year], 67 y.o., female Today's Date: 07/13/2022  PCP: Dr. Delsa Grana  REFERRING PROVIDER: Dr. Delsa Grana   END OF SESSION:   PT End of Session - 07/13/22 1506     Visit Number 6    Number of Visits 20    Date for PT Re-Evaluation 08/23/22    Authorization Type [employer]    Authorization - Visit Number 6    Authorization - Number of Visits 20    Progress Note Due on Visit 10    PT Start Time 1500    PT Stop Time [id number]    PT Time Calculation (min) 45 min    Activity Tolerance Patient tolerated treatment well    Behavior During Therapy [facility] for tasks assessed/performed              Past Medical History:  Diagnosis Date   Acute pain of left shoulder 06/08/2020   Allergic reaction 02/12/2017   Bilateral hearing loss    wears hearing aids   Depression    Genital herpes    GERD (gastroesophageal reflux disease)    Herpes simplex virus infection    Murmur    Neuropathy of both feet 06/05/2015   OA (osteoarthritis) of hip    left   Osteopenia of lumbar spine 2010   Plantar fasciitis    Stomatitis    Past Surgical History:  Procedure Laterality Date   CESAREAN SECTION     COLONOSCOPY  2010   COLONOSCOPY WITH PROPOFOL N/A 11/27/2018   Procedure: COLONOSCOPY WITH PROPOFOL;  Surgeon: Lin Landsman, MD;  Location: [place];  Service: Endoscopy;  Laterality: N/A;   ENDOSCOPIC CONCHA BULLOSA RESECTION Left 05/23/2018   Procedure: ENDOSCOPIC CONCHA BULLOSA RESECTION;  Surgeon: Margaretha Sheffield, MD;  Location: [place];  Service: ENT;  Laterality: Left;   FOOT SURGERY Right    REPLACEMENT TOTAL KNEE  3/13   Dr.Hallows at [place] Bilateral 05/23/2018   Procedure: SEPTOPLASTY;  Surgeon: Margaretha Sheffield, MD;  Location: [place];  Service: ENT;  Laterality: Bilateral;   TOTAL HIP ARTHROPLASTY Left 10/2008   TURBINATE  REDUCTION Bilateral 05/23/2018   Procedure: INFERIOR TURBINATE REDUCTION;  Surgeon: Margaretha Sheffield, MD;  Location: [place];  Service: ENT;  Laterality: Bilateral;   UPPER GI ENDOSCOPY  1992   Patient Active Problem List   Diagnosis Date Noted   Prediabetes 08/11/2021   Rectal bleeding    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   History of total hip replacement 03/05/2017   Dermatitis 02/12/2017   Allergic rhinitis 04/16/2016   Presence of right artificial knee joint 12/16/2015   Obesity (BMI 30.0-34.9) 11/18/2015   Persistent depressive disorder 11/18/2015   Vitamin D deficiency 11/11/2015   Mass of left lower leg 09/07/2015   Adverse drug reaction 06/13/2015   Greater trochanteric bursitis of left hip 06/05/2015   Hammer toe of right foot 06/05/2015   Neuropathy of both feet 06/05/2015   Chronic kidney disease, 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/05/2015   Lower back pain 03/10/2015   OA (osteoarthritis) of hip    GERD (gastroesophageal reflux disease)    Plantar fasciitis    Depression    Genital herpes    Bilateral hearing loss    Abnormal ECG 09/09/2014   Hip pain 09/09/2012   Other mechanical complication of other internal orthopedic device, implant, and graft  09/09/2012   Medial collateral ligament sprain of knee 11/13/2011   Other and unspecified derangement of medial meniscus 06/13/2011   Primary localized osteoarthrosis, lower leg 06/13/2011    REFERRING DIAG: Left knee pain and right hip pain   THERAPY DIAG:  Chronic pain of left knee  Pain in right hip  Rationale for Evaluation and Treatment Rehabilitation  PERTINENT HISTORY: Pt reports having left total knee done last year and she also has a left total hip done some years ago. Her left knee hurts the most and she believes that it could be causing her right sided low back pain. She has history of scoliosis. 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 Last year in December, she slipped and fell on right side which caused her right hip/low back pain as a result.    PRECAUTIONS: None   SUBJECTIVE:                                                                                                                                                                                      SUBJECTIVE STATEMENT: Pt reports feeling increased and hip and knee pain after gardening yesterday. She has been able to do all the exercises without much difficulty with exception of IT band stretch.    PAIN:  Are you having pain? Yes: NPRS scale: 4-6/10 Pain location: Left knee and right hip  Pain description: Achy  Aggravating factors: Moving  Relieving factors: Sitting    OBJECTIVE: (objective measures completed at initial evaluation unless otherwise dated)  VITALS: BP 153/82 HR 74 SpO2 100    DIAGNOSTIC FINDINGS:  Knee Imaging:  3 Views of left knee  Implants in appropriate postion at this time without any evidence of  complication.  Longstanding view shows 2 degree Varus alignment    PATIENT SURVEYS:  FOTO 51/100   SCREENING FOR RED FLAGS: Bowel or bladder incontinence: No Spinal tumors: No Cauda equina syndrome: No Compression fracture: No Abdominal aneurysm: No   COGNITION: Overall cognitive status: Within functional limits for tasks assessed                          SENSATION: WFL   MUSCLE LENGTH: Hamstrings: Right 90 deg; Left 90 deg Thomas test: Negative Bilateral   Ely's Test: Negative bilateral  Ober's Test: Negative bilateral    POSTURE: No Significant postural limitations   PALPATION:       Left lateral joint line of left knee, crepitus with flexion and extension, Right glute med TTP     LUMBAR ROM:    AROM eval  Flexion 100%  Extension 100%  Right lateral flexion 100%  Left lateral flexion 100%  Right rotation 100%  Left rotation 100%   (Blank rows = not tested)   LOWER EXTREMITY ROM:          Active   Right 06/14/2022 Left 06/14/2022  Hip flexion 120 120  Hip extension      Hip abduction      Hip adduction      Hip internal rotation      Hip external rotation      Knee flexion 135 135  Knee extension 0 0  Ankle dorsiflexion      Ankle plantarflexion      Ankle inversion      Ankle eversion       (Blank rows = not tested)        LOWER EXTREMITY MMT:     MMT Right eval Left eval  Hip flexion 4+ 4  Hip extension 4- 4-  Hip abduction 4- 4-  Hip adduction 4- 4-  Hip internal rotation      Hip external rotation      Knee flexion 4 4  Knee extension 4 4  Ankle dorsiflexion 5 5  Ankle plantarflexion      Ankle inversion      Ankle eversion       (Blank rows = not tested)   LUMBAR SPECIAL TESTS:  Straight leg raise test: Negative FADIR: Negative FABER: Positive on RLE    FUNCTIONAL TESTS:  Squat: Knees beyond toes and unable to maintain upright trunk                        Single Leg Stance on RLE: RLE Hip Drop GAIT:  Distance walked: 30 ft  Assistive device utilized: None Level of assistance: Complete Independence Comments: Right hip drop in stance    TODAY'S TREATMENT:                                                                                                                              DATE:   07/13/22: Nu-step seat and arms level 8 and level 2 for resistance for 5 min  OMEGA Knee Ext #10 2 x 10  OMEGA Knee Ext #15 1 x 10  OMEGA Knee Flex #25 2 x 10  OMEGA Knee Flex #30 1 x 10  OMEGA Leg Ext #60 3 x 10  Standing Hip Extension with red band 1 x 10  Standing Hip Extension with green band 2 x 10  -min VC to remove left shoe to decreased left hip hike   NMR  Romberg on foam 2 x 30 sec  Romberg on foam with vertical head turns 2 x 10  -mild sway  Romberg on foam with horizontal head turns  -Increased ankle strategy with moderate sway   07/04/22: Nu-step seat and arms level 8 and level 2 for resistance for 5 min  Standing Hip Abduction with BUE support 3  x 10  Standing Hip Extension with BUE support 3 x 10  TFL Hip Raise on RLE 3 x 10  Supine Bridges 3 x 10  Single leg stance 2 x 10 sec  -Pt unable to maintain balance without UE support  Semi-tandem 2 x 30 sec  Semi-tandem with horizontal head turns 2 x 10   06/28/22:  THEREX  Nu-Step seat and arms level 6 for resistance 2 for 5 min  TFL Hip Raises on RLE 3 x 10   Right Glute Med Trigger Point Release unable to relieve and no palpable trigger point over are pt reported as painful  Hip Scour Test: Negative on RLE   MANUAL Stretches led by PT   Supine ER Stretch on RLE 2 x 30 sec  Supine Hip Abduction on RLE 2 x 30 sec  Supine HS Stretch on RLE 2 x 30 sec   06/26/22: Nu-Step seat and arms level 9 for resistance 2 for 5 min DGI: 21/24  -Mild Impairment: Stairs, vertical head turn, and obstacles  Standing Hip Abduction with BUE support 3 x 10     PATIENT EDUCATION:  Education details: form and technique for accurate performance of exercise  Person educated: Patient Education method: Explanation, Demonstration, Tactile cues, Verbal cues, and Handouts Education comprehension: verbalized understanding and returned demonstration   HOME EXERCISE PROGRAM: Access Code: [email or web address] URL: [email or web address] Date: 07/13/2022 Prepared by: Bradly Chris  Exercises - Supine Lower Trunk Rotation  - 1 x daily - 3 sets - 10 reps - Bound Angle Hands Forward   - 1 x daily - 3 reps - 60 sec  hold - Seated Hip External Rotation Stretch  - 1 x daily - 3 reps - 60 sec  hold - Romberg Stance on Foam Pad with Head Rotation  - 3 x weekly - 3 sets - 10 reps - Standing Hip Extension with Counter Support  - 3 x weekly - 3 sets - 10 reps - Standing Hip Abduction with Counter Support  - 3 x weekly - 3 sets - 10 reps - Standing March with Counter Support  - 3 x weekly - 3 sets - 10 reps - Sidelying ITB Stretch off Table  - 1 x daily - 3 reps - 60 sec  hold  ASSESSMENT:   CLINICAL  IMPRESSION: Pt continues to demonstrate and improvement in LE strength with ability to perform knee and hip strengthening exercises on machines and standing hip extension exercise without an increase in her joint pain. Modified static balance exercise to include romberg stance on foam, because of pt report of unsteadiness on uneven surfaces. She will continue to benefit from skilled PT to address these deficits to return to walking for recreation and to improve balance to decrease risk of falling.    OBJECTIVE IMPAIRMENTS: Abnormal gait, decreased balance, decreased strength, obesity, and pain.    ACTIVITY LIMITATIONS: lifting, bending, sitting, standing, squatting, and stairs   PARTICIPATION LIMITATIONS: cleaning, community activity, and school   PERSONAL FACTORS: Age, Fitness, Time since onset of injury/illness/exacerbation, and 3+ comorbidities: depression, h/o of right TKA and THA  are also affecting patient's functional outcome.    REHAB POTENTIAL: Excellent   CLINICAL DECISION MAKING: Stable/uncomplicated   EVALUATION COMPLEXITY: Low     GOALS: Goals reviewed with patient? No   SHORT TERM GOALS: Target date: 06/28/2022   Pt will be independent with HEP in order to improve strength and balance in order to  decrease fall risk and improve function at home and work. Baseline:Performing exercises independently  Goal status: Ongoing      LONG TERM GOALS: Target date: 08/23/2022   Patient will have improved function and activity level as evidenced by an increase in FOTO score by 10 points or more.  Baseline: 51/100  Goal status: Ongoing    2.  Patient will improve hip strength by 1/3 MMT (ie 4- to 4) for improved LE stability to offload knee and improve left knee pain while standing and while negotiating stairs.  Baseline: Hip Flex R/L 4/4, Hip Ext 4-/4-, Hip Abd R/L 4-/4-, Hip Flex R/L 4+/4 Goal status: Ongoing    3.  Pt will decrease 5TSTS by at least 3 seconds in order to  demonstrate clinically significant improvement in LE strength   Baseline: 15 sec  Goal status: Ongoing    4.  Patient will perform >=11 chair stands in 30 sec to demonstrate age and gender matched LE endurance where she is not at risk of falling.  Baseline: 9 Goal status: Ongoing    5.  Patient will demonstrate reduced falls risk as evidenced by Dynamic Gait Index (DGI) >19/24. Baseline: 21 Goal status: Deferred       PLAN:   PT FREQUENCY: 1-2x/week   PT DURATION: 10 weeks   PLANNED INTERVENTIONS: Therapeutic exercises, Neuromuscular re-education, Balance training, Gait training, Patient/Family education, Self Care, Joint mobilization, Joint manipulation, Aquatic Therapy, Dry Needling, Electrical stimulation, Spinal manipulation, Spinal mobilization, Cryotherapy, Moist heat, Manual therapy, and Re-evaluation.   PLAN FOR NEXT SESSION:   Practice walking and standing  on uneven surfaces. Progress static balance with eyes closed on foam. Progress standing strengthening and balance exercises with decreased UE support and increased resistance.  Bradly Chris PT, DPT  07/13/2022, 3:07 PM

## 2022-07-18 ENCOUNTER — Telehealth: Payer: Self-pay | Admitting: Family Medicine

## 2022-07-18 ENCOUNTER — Ambulatory Visit: Payer: Medicare Other | Admitting: Physical Therapy

## 2022-07-18 DIAGNOSIS — M25562 Pain in left knee: Secondary | ICD-10-CM | POA: Diagnosis not present

## 2022-07-18 DIAGNOSIS — G8929 Other chronic pain: Secondary | ICD-10-CM

## 2022-07-18 DIAGNOSIS — M25551 Pain in right hip: Secondary | ICD-10-CM

## 2022-07-18 NOTE — Telephone Encounter (Signed)
Contacted Khaylah A Ettinger to schedule their annual wellness visit. Appointment made for 07/20/2022.  [facility] TEAM Direct Dial: [phone]

## 2022-07-18 NOTE — Therapy (Signed)
OUTPATIENT PHYSICAL THERAPY TREATMENT NOTE   Patient Name: Olivia Morgan MRN: [id number] DOB:04/23/1955, 67 y.o., female Today's Date: 07/18/2022  PCP: Dr. Danelle Berry  REFERRING PROVIDER: Dr. Danelle Berry   END OF SESSION:   PT End of Session - 07/18/22 1540     Visit Number 7    Number of Visits 20    Date for PT Re-Evaluation 08/23/22    Authorization Type [employer]    Authorization - Visit Number 7    Authorization - Number of Visits 20    Progress Note Due on Visit 10    PT Start Time 1500    PT Stop Time 1545    PT Time Calculation (min) 45 min    Activity Tolerance Patient tolerated treatment well    Behavior During Therapy [facility] for tasks assessed/performed               Past Medical History:  Diagnosis Date   Acute pain of left shoulder 06/08/2020   Allergic reaction 02/12/2017   Bilateral hearing loss    wears hearing aids   Depression    Genital herpes    GERD (gastroesophageal reflux disease)    Herpes simplex virus infection    Murmur    Neuropathy of both feet 06/05/2015   OA (osteoarthritis) of hip    left   Osteopenia of lumbar spine 2010   Plantar fasciitis    Stomatitis    Past Surgical History:  Procedure Laterality Date   CESAREAN SECTION     COLONOSCOPY  2010   COLONOSCOPY WITH PROPOFOL N/A 11/27/2018   Procedure: COLONOSCOPY WITH PROPOFOL;  Surgeon: Toney Reil, MD;  Location: [facility];  Service: Endoscopy;  Laterality: N/A;   ENDOSCOPIC CONCHA BULLOSA RESECTION Left 05/23/2018   Procedure: ENDOSCOPIC CONCHA BULLOSA RESECTION;  Surgeon: Vernie Murders, MD;  Location: [facility];  Service: ENT;  Laterality: Left;   FOOT SURGERY Right    REPLACEMENT TOTAL KNEE  3/13   Dr.Hallows at [facility]   SEPTOPLASTY Bilateral 05/23/2018   Procedure: SEPTOPLASTY;  Surgeon: Vernie Murders, MD;  Location: [facility];  Service: ENT;  Laterality: Bilateral;   TOTAL HIP ARTHROPLASTY Left 10/2008   TURBINATE  REDUCTION Bilateral 05/23/2018   Procedure: INFERIOR TURBINATE REDUCTION;  Surgeon: Vernie Murders, MD;  Location: [facility];  Service: ENT;  Laterality: Bilateral;   UPPER GI ENDOSCOPY  1992   Patient Active Problem List   Diagnosis Date Noted   Prediabetes 08/11/2021   Rectal bleeding    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   History of total hip replacement 03/05/2017   Dermatitis 02/12/2017   Allergic rhinitis 04/16/2016   Presence of right artificial knee joint 12/16/2015   Obesity (BMI 30.0-34.9) 11/18/2015   Persistent depressive disorder 11/18/2015   Vitamin D deficiency 11/11/2015   Mass of left lower leg 09/07/2015   Adverse drug reaction 06/13/2015   Greater trochanteric bursitis of left hip 06/05/2015   Hammer toe of right foot 06/05/2015   Neuropathy of both feet 06/05/2015   Chronic kidney disease, 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/05/2015   Lower back pain 03/10/2015   OA (osteoarthritis) of hip    GERD (gastroesophageal reflux disease)    Plantar fasciitis    Depression    Genital herpes    Bilateral hearing loss    Abnormal ECG 09/09/2014   Hip pain 09/09/2012   Other mechanical complication of other internal orthopedic device, implant, and  graft 09/09/2012   Medial collateral ligament sprain of knee 11/13/2011   Other and unspecified derangement of medial meniscus 06/13/2011   Primary localized osteoarthrosis, lower leg 06/13/2011    REFERRING DIAG: Left knee pain and right hip pain   THERAPY DIAG:  Chronic pain of left knee  Pain in right hip  Rationale for Evaluation and Treatment Rehabilitation  PERTINENT HISTORY: Pt reports having left total knee done last year and she also has a left total hip done some years ago. Her left knee hurts the most and she believes that it could be causing her right sided low back pain. She has history of scoliosis. 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 Last year in December, she slipped and fell on right side which caused her right hip/low back pain as a result.    PRECAUTIONS: None   SUBJECTIVE:                                                                                                                                                                                      SUBJECTIVE STATEMENT: Pt states that she is feeling somewhat better especially in the right hip. She feels most of her pain in the morning upon waking up and getting out of her bed.    PAIN:  Are you having pain? Yes: NPRS scale: 4-5/10, 2-3/10 in left knee  Pain location: Left knee and right hip  Pain description: Achy  Aggravating factors: Moving  Relieving factors: Sitting    OBJECTIVE: (objective measures completed at initial evaluation unless otherwise dated)  VITALS: BP 153/82 HR 74 SpO2 100    DIAGNOSTIC FINDINGS:  Knee Imaging:  3 Views of left knee  Implants in appropriate postion at this time without any evidence of  complication.  Longstanding view shows 2 degree Varus alignment    PATIENT SURVEYS:  FOTO 51/100   SCREENING FOR RED FLAGS: Bowel or bladder incontinence: No Spinal tumors: No Cauda equina syndrome: No Compression fracture: No Abdominal aneurysm: No   COGNITION: Overall cognitive status: Within functional limits for tasks assessed                          SENSATION: WFL   MUSCLE LENGTH: Hamstrings: Right 90 deg; Left 90 deg Thomas test: Negative Bilateral   Ely's Test: Negative bilateral  Ober's Test: Negative bilateral    POSTURE: No Significant postural limitations   PALPATION:       Left lateral joint line of left knee, crepitus with flexion and extension, Right glute med TTP     LUMBAR  ROM:    AROM eval  Flexion 100%  Extension 100%  Right lateral flexion 100%  Left lateral flexion 100%  Right rotation 100%  Left rotation 100%   (Blank rows = not tested)   LOWER EXTREMITY ROM:          Active   Right 06/14/2022 Left 06/14/2022  Hip flexion 120 120  Hip extension      Hip abduction      Hip adduction      Hip internal rotation      Hip external rotation      Knee flexion 135 135  Knee extension 0 0  Ankle dorsiflexion      Ankle plantarflexion      Ankle inversion      Ankle eversion       (Blank rows = not tested)        LOWER EXTREMITY MMT:     MMT Right eval Left eval  Hip flexion 4+ 4  Hip extension 4- 4-  Hip abduction 4- 4-  Hip adduction 4- 4-  Hip internal rotation      Hip external rotation      Knee flexion 4 4  Knee extension 4 4  Ankle dorsiflexion 5 5  Ankle plantarflexion      Ankle inversion      Ankle eversion       (Blank rows = not tested)   LUMBAR SPECIAL TESTS:  Straight leg raise test: Negative FADIR: Negative FABER: Positive on RLE    FUNCTIONAL TESTS:  Squat: Knees beyond toes and unable to maintain upright trunk                        Single Leg Stance on RLE: RLE Hip Drop GAIT:  Distance walked: 30 ft  Assistive device utilized: None Level of assistance: Complete Independence Comments: Right hip drop in stance    TODAY'S TREATMENT:                                                                                                                              DATE:   07/18/22:  THEREX  Nu-step seat and arms level 7 and level 3 for resistance for 6 min  OMEGA Leg Extension with seat at 1 #55 3 x 10   NMR  Romberg on foam with vertical head turns 2 x 10  -mild sway with ankle strategy   Romberg on foam with horizontal head turns 2 x 10  -moderate sway with ankle strategy   Romberg on foam with eyes closed 2 x 30 sec  Romberg on foam with eyes closed horizontal head turns 2 x 10  Romberg on foam with eyes closed vertical head turns 2 x 10  -moderate sway with ankle strategy Half tandem on foam with eyes open 2 x 30 sec  Half tandem horizontal head turns 3 x 10  -moderate sway with hip strategy  Half tandem vertical head  turns 3 x 10  -moderate sway with ankle strategy   07/13/22: Nu-step seat and arms level 8 and level 2 for resistance for 5 min  OMEGA Knee Ext #10 2 x 10  OMEGA Knee Ext #15 1 x 10  OMEGA Knee Flex #25 2 x 10  OMEGA Knee Flex #30 1 x 10  OMEGA Leg Ext #60 3 x 10  Standing Hip Extension with red band 1 x 10  Standing Hip Extension with green band 2 x 10  -min VC to remove left shoe to decreased left hip hike   NMR  Romberg on foam 2 x 30 sec  Romberg on foam with vertical head turns 2 x 10  -mild sway  Romberg on foam with horizontal head turns  -Increased ankle strategy with moderate sway   07/04/22: Nu-step seat and arms level 8 and level 2 for resistance for 5 min  Standing Hip Abduction with BUE support 3 x 10  Standing Hip Extension with BUE support 3 x 10  TFL Hip Raise on RLE 3 x 10  Supine Bridges 3 x 10  Single leg stance 2 x 10 sec  -Pt unable to maintain balance without UE support  Semi-tandem 2 x 30 sec  Semi-tandem with horizontal head turns 2 x 10    PATIENT EDUCATION:  Education details: form and technique for accurate performance of exercise  Person educated: Patient Education method: Explanation, Demonstration, Tactile cues, Verbal cues, and Handouts Education comprehension: verbalized understanding and returned demonstration   HOME EXERCISE PROGRAM: Access Code: [email or web address] URL: [email or web address] Date: 07/18/2022 Prepared by: Ellin Goodie  Exercises - Supine Lower Trunk Rotation  - 1 x daily - 3 sets - 10 reps - Bound Angle Hands Forward   - 1 x daily - 3 reps - 60 sec  hold - Seated Hip External Rotation Stretch  - 1 x daily - 3 reps - 60 sec  hold - Semi tandem on foam pad with horizontal head rotation   - 3 x weekly - 3 sets - 10 reps - Standing Hip Extension with Counter Support  - 3 x weekly - 3 sets - 10 reps - Standing Hip Abduction with Counter Support  - 3 x weekly - 3 sets - 10 reps - Standing March with Counter Support  - 3  x weekly - 3 sets - 10 reps - Sidelying ITB Stretch off Table  - 1 x daily - 3 reps - 60 sec  hold  ASSESSMENT:   CLINICAL IMPRESSION: Pt shows improved static balance with ability to perform head turns with eyes closed on foam. HEP modified to include increased difficulty of static balance task with semi-tandem with eyes closed on foam.  She will continue to benefit from skilled PT to address these deficits to return to walking for recreation and to improve balance to decrease risk of falling.   OBJECTIVE IMPAIRMENTS: Abnormal gait, decreased balance, decreased strength, obesity, and pain.    ACTIVITY LIMITATIONS: lifting, bending, sitting, standing, squatting, and stairs   PARTICIPATION LIMITATIONS: cleaning, community activity, and school   PERSONAL FACTORS: Age, Fitness, Time since onset of injury/illness/exacerbation, and 3+ comorbidities: depression, h/o of right TKA and THA  are also affecting patient's functional outcome.    REHAB POTENTIAL: Excellent   CLINICAL DECISION MAKING: Stable/uncomplicated   EVALUATION COMPLEXITY: Low     GOALS: Goals reviewed with patient? No   SHORT TERM GOALS: Target date: 06/28/2022   Pt will  be independent with HEP in order to improve strength and balance in order to decrease fall risk and improve function at home and work. Baseline:Performing exercises independently  Goal status: Ongoing      LONG TERM GOALS: Target date: 08/23/2022   Patient will have improved function and activity level as evidenced by an increase in FOTO score by 10 points or more.  Baseline: 51/100  Goal status: Ongoing    2.  Patient will improve hip strength by 1/3 MMT (ie 4- to 4) for improved LE stability to offload knee and improve left knee pain while standing and while negotiating stairs.  Baseline: Hip Flex R/L 4/4, Hip Ext 4-/4-, Hip Abd R/L 4-/4-, Hip Flex R/L 4+/4 Goal status: Ongoing    3.  Pt will decrease 5TSTS by at least 3 seconds in order to  demonstrate clinically significant improvement in LE strength   Baseline: 15 sec  Goal status: Ongoing    4.  Patient will perform >=11 chair stands in 30 sec to demonstrate age and gender matched LE endurance where she is not at risk of falling.  Baseline: 9 Goal status: Ongoing    5.  Patient will demonstrate reduced falls risk as evidenced by Dynamic Gait Index (DGI) >19/24. Baseline: 21 Goal status: Deferred       PLAN:   PT FREQUENCY: 1-2x/week   PT DURATION: 10 weeks   PLANNED INTERVENTIONS: Therapeutic exercises, Neuromuscular re-education, Balance training, Gait training, Patient/Family education, Self Care, Joint mobilization, Joint manipulation, Aquatic Therapy, Dry Needling, Electrical stimulation, Spinal manipulation, Spinal mobilization, Cryotherapy, Moist heat, Manual therapy, and Re-evaluation.   PLAN FOR NEXT SESSION:   Review sleeping position. Practice walking and standing  on uneven surfaces. Progress standing strengthening and balance exercises with decreased UE support and increased resistance. Hip extension with increased resistance or even bird dogs   Ellin Goodie PT, DPT  07/18/2022, 3:46 PM

## 2022-07-20 ENCOUNTER — Encounter: Payer: Self-pay | Admitting: Physical Therapy

## 2022-07-20 ENCOUNTER — Ambulatory Visit: Payer: Medicare Other | Admitting: Physical Therapy

## 2022-07-20 ENCOUNTER — Telehealth: Payer: Self-pay

## 2022-07-20 DIAGNOSIS — M25551 Pain in right hip: Secondary | ICD-10-CM

## 2022-07-20 DIAGNOSIS — G8929 Other chronic pain: Secondary | ICD-10-CM | POA: Diagnosis not present

## 2022-07-20 DIAGNOSIS — M25562 Pain in left knee: Secondary | ICD-10-CM | POA: Diagnosis not present

## 2022-07-20 NOTE — Telephone Encounter (Signed)
07/20/2022 09:33 AM EDT by Sue Lush, LPN  Outgoing Olivia, Morgan (Self) [phone] (Home) Remove  Not Available - pt called for AWV: message received: "mailbox is full and cannot accept any messages at this time"  07/20/2022 09:43 AM EDT by Sue Lush, LPN  Outgoing Olivia, Morgan (Self) [phone] (Home) Remove  No Answer/Busy - called againg to do AWV: no vm available

## 2022-07-20 NOTE — Therapy (Signed)
OUTPATIENT PHYSICAL THERAPY TREATMENT NOTE   Patient Name: [name] MRN: [id number] DOB:04/23/1955, 67 y.o., female Today's Date: 07/18/2022  PCP: Dr. Danelle Berry  REFERRING PROVIDER: Dr. Danelle Berry   END OF SESSION:   PT End of Session - 07/18/22 1540     Visit Number 7    Number of Visits 20    Date for PT Re-Evaluation 08/23/22    Authorization Type [employer]    Authorization - Visit Number 7    Authorization - Number of Visits 20    Progress Note Due on Visit 10    PT Start Time 1500    PT Stop Time 1545    PT Time Calculation (min) 45 min    Activity Tolerance Patient tolerated treatment well    Behavior During Therapy [facility] for tasks assessed/performed               Past Medical History:  Diagnosis Date   Acute pain of left shoulder 06/08/2020   Allergic reaction 02/12/2017   Bilateral hearing loss    wears hearing aids   Depression    Genital herpes    GERD (gastroesophageal reflux disease)    Herpes simplex virus infection    Murmur    Neuropathy of both feet 06/05/2015   OA (osteoarthritis) of hip    left   Osteopenia of lumbar spine 2010   Plantar fasciitis    Stomatitis    Past Surgical History:  Procedure Laterality Date   CESAREAN SECTION     COLONOSCOPY  2010   COLONOSCOPY WITH PROPOFOL N/A 11/27/2018   Procedure: COLONOSCOPY WITH PROPOFOL;  Surgeon: Toney Reil, MD;  Location: [facility];  Service: Endoscopy;  Laterality: N/A;   ENDOSCOPIC CONCHA BULLOSA RESECTION Left 05/23/2018   Procedure: ENDOSCOPIC CONCHA BULLOSA RESECTION;  Surgeon: Vernie Murders, MD;  Location: [facility];  Service: ENT;  Laterality: Left;   FOOT SURGERY Right    REPLACEMENT TOTAL KNEE  3/13   Dr.Hallows at [facility]   SEPTOPLASTY Bilateral 05/23/2018   Procedure: SEPTOPLASTY;  Surgeon: Vernie Murders, MD;  Location: [facility];  Service: ENT;  Laterality: Bilateral;   TOTAL HIP ARTHROPLASTY Left 10/2008   TURBINATE  REDUCTION Bilateral 05/23/2018   Procedure: INFERIOR TURBINATE REDUCTION;  Surgeon: Vernie Murders, MD;  Location: [facility];  Service: ENT;  Laterality: Bilateral;   UPPER GI ENDOSCOPY  1992   Patient Active Problem List   Diagnosis Date Noted   Prediabetes 08/11/2021   Rectal bleeding    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   History of total hip replacement 03/05/2017   Dermatitis 02/12/2017   Allergic rhinitis 04/16/2016   Presence of right artificial knee joint 12/16/2015   Obesity (BMI 30.0-34.9) 11/18/2015   Persistent depressive disorder 11/18/2015   Vitamin D deficiency 11/11/2015   Mass of left lower leg 09/07/2015   Adverse drug reaction 06/13/2015   Greater trochanteric bursitis of left hip 06/05/2015   Hammer toe of right foot 06/05/2015   Neuropathy of both feet 06/05/2015   Chronic kidney disease, 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/05/2015   Lower back pain 03/10/2015   OA (osteoarthritis) of hip    GERD (gastroesophageal reflux disease)    Plantar fasciitis    Depression    Genital herpes    Bilateral hearing loss    Abnormal ECG 09/09/2014   Hip pain 09/09/2012   Other mechanical complication of other internal orthopedic device, implant, and  graft 09/09/2012   Medial collateral ligament sprain of knee 11/13/2011   Other and unspecified derangement of medial meniscus 06/13/2011   Primary localized osteoarthrosis, lower leg 06/13/2011    REFERRING DIAG: Left knee pain and right hip pain   THERAPY DIAG:  Chronic pain of left knee  Pain in right hip  Rationale for Evaluation and Treatment Rehabilitation  PERTINENT HISTORY: Pt reports having left total knee done last year and she also has a left total hip done some years ago. Her left knee hurts the most and she believes that it could be causing her right sided low back pain. She has history of scoliosis. 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 Last year in December, she slipped and fell on right side which caused her right hip/low back pain as a result.    PRECAUTIONS: None   SUBJECTIVE:                                                                                                                                                                                      SUBJECTIVE STATEMENT: Pt states that she is feeling somewhat better especially in the right hip. She feels most of her pain in the morning upon waking up and getting out of her bed.    PAIN:  Are you having pain? Yes: NPRS scale: 4-5/10, 2-3/10 in left knee  Pain location: Left knee and right hip  Pain description: Achy  Aggravating factors: Moving  Relieving factors: Sitting    OBJECTIVE: (objective measures completed at initial evaluation unless otherwise dated)  VITALS: BP 153/82 HR 74 SpO2 100    DIAGNOSTIC FINDINGS:  Knee Imaging:  3 Views of left knee  Implants in appropriate postion at this time without any evidence of  complication.  Longstanding view shows 2 degree Varus alignment    PATIENT SURVEYS:  FOTO 51/100   SCREENING FOR RED FLAGS: Bowel or bladder incontinence: No Spinal tumors: No Cauda equina syndrome: No Compression fracture: No Abdominal aneurysm: No   COGNITION: Overall cognitive status: Within functional limits for tasks assessed                          SENSATION: WFL   MUSCLE LENGTH: Hamstrings: Right 90 deg; Left 90 deg Thomas test: Negative Bilateral   Ely's Test: Negative bilateral  Ober's Test: Negative bilateral    POSTURE: No Significant postural limitations   PALPATION:       Left lateral joint line of left knee, crepitus with flexion and extension, Right glute med TTP     LUMBAR  ROM:    AROM eval  Flexion 100%  Extension 100%  Right lateral flexion 100%  Left lateral flexion 100%  Right rotation 100%  Left rotation 100%   (Blank rows = not tested)   LOWER EXTREMITY ROM:          Active   Right 06/14/2022 Left 06/14/2022  Hip flexion 120 120  Hip extension      Hip abduction      Hip adduction      Hip internal rotation      Hip external rotation      Knee flexion 135 135  Knee extension 0 0  Ankle dorsiflexion      Ankle plantarflexion      Ankle inversion      Ankle eversion       (Blank rows = not tested)        LOWER EXTREMITY MMT:     MMT Right eval Left eval  Hip flexion 4+ 4  Hip extension 4- 4-  Hip abduction 4- 4-  Hip adduction 4- 4-  Hip internal rotation      Hip external rotation      Knee flexion 4 4  Knee extension 4 4  Ankle dorsiflexion 5 5  Ankle plantarflexion      Ankle inversion      Ankle eversion       (Blank rows = not tested)   LUMBAR SPECIAL TESTS:  Straight leg raise test: Negative FADIR: Negative FABER: Positive on RLE    FUNCTIONAL TESTS:  Squat: Knees beyond toes and unable to maintain upright trunk                        Single Leg Stance on RLE: RLE Hip Drop GAIT:  Distance walked: 30 ft  Assistive device utilized: None Level of assistance: Complete Independence Comments: Right hip drop in stance    TODAY'S TREATMENT:                                                                                                                              DATE:   07/20/22: THEREX  Nu-step seat and arms level 7 and level 3 for resistance for 5 min  OMEGA LE Extension #60 lbs 3 x 10  OMEGA Knee Extension #20 lbs 3 x 10  OMEGA Knee Flex #35 3 x 10  Prone Quad Stretch 2 x 30 sec with PT overpressure   07/18/22:  THEREX  Nu-step seat and arms level 7 and level 3 for resistance for 6 min  OMEGA Leg Extension with seat at 1 #55 3 x 10   NMR  Romberg on foam with vertical head turns 2 x 10  -mild sway with ankle strategy   Romberg on foam with horizontal head turns 2 x 10  -moderate sway with ankle strategy   Romberg on foam with eyes closed 2 x 30 sec  Romberg  on foam with eyes closed horizontal head turns 2 x 10  Romberg  on foam with eyes closed vertical head turns 2 x 10  -moderate sway with ankle strategy Half tandem on foam with eyes open 2 x 30 sec  Half tandem horizontal head turns 3 x 10  -moderate sway with hip strategy  Half tandem vertical head turns 3 x 10  -moderate sway with ankle strategy   07/13/22: Nu-step seat and arms level 8 and level 2 for resistance for 5 min  OMEGA Knee Ext #10 2 x 10  OMEGA Knee Ext #15 1 x 10  OMEGA Knee Flex #25 2 x 10  OMEGA Knee Flex #30 1 x 10  OMEGA Leg Ext #60 3 x 10  Standing Hip Extension with red band 1 x 10  Standing Hip Extension with green band 2 x 10  -min VC to remove left shoe to decreased left hip hike   NMR  Romberg on foam 2 x 30 sec  Romberg on foam with vertical head turns 2 x 10  -mild sway  Romberg on foam with horizontal head turns  -Increased ankle strategy with moderate sway      PATIENT EDUCATION:  Education details: form and technique for accurate performance of exercise  Person educated: Patient Education method: Explanation, Demonstration, Tactile cues, Verbal cues, and Handouts Education comprehension: verbalized understanding and returned demonstration   HOME EXERCISE PROGRAM: Access Code: [email or web address] URL: [email or web address] Date: 07/18/2022 Prepared by: Ellin Goodieaniel Alixis Harmon  Exercises - Supine Lower Trunk Rotation  - 1 x daily - 3 sets - 10 reps - Bound Angle Hands Forward   - 1 x daily - 3 reps - 60 sec  hold - Seated Hip External Rotation Stretch  - 1 x daily - 3 reps - 60 sec  hold - Semi tandem on foam pad with horizontal head rotation   - 3 x weekly - 3 sets - 10 reps - Standing Hip Extension with Counter Support  - 3 x weekly - 3 sets - 10 reps - Standing Hip Abduction with Counter Support  - 3 x weekly - 3 sets - 10 reps - Standing March with Counter Support  - 3 x weekly - 3 sets - 10 reps - Sidelying ITB Stretch off Table  - 1 x daily - 3 reps - 60 sec  hold  ASSESSMENT:   CLINICAL  IMPRESSION: Pt shows improvement with LE strength with ability to perform LE strengthening exercises with increased resistance. Pt is nearing end of plan of care and her goals will be reassessed next appointment given that it will be her 10th and it will be a progress note. She will continue to benefit from skilled PT to address these deficits to return to walking for recreation and to improve balance to decrease risk of falling.   OBJECTIVE IMPAIRMENTS: Abnormal gait, decreased balance, decreased strength, obesity, and pain.    ACTIVITY LIMITATIONS: lifting, bending, sitting, standing, squatting, and stairs   PARTICIPATION LIMITATIONS: cleaning, community activity, and school   PERSONAL FACTORS: Age, Fitness, Time since onset of injury/illness/exacerbation, and 3+ comorbidities: depression, h/o of right TKA and THA  are also affecting patient's functional outcome.    REHAB POTENTIAL: Excellent   CLINICAL DECISION MAKING: Stable/uncomplicated   EVALUATION COMPLEXITY: Low     GOALS: Goals reviewed with patient? No   SHORT TERM GOALS: Target date: 06/28/2022   Pt will be independent with HEP in order to improve strength and balance in order  to decrease fall risk and improve function at home and work. Baseline:Performing exercises independently  Goal status: Ongoing      LONG TERM GOALS: Target date: 08/23/2022   Patient will have improved function and activity level as evidenced by an increase in FOTO score by 10 points or more.  Baseline: 51/100  Goal status: Ongoing    2.  Patient will improve hip strength by 1/3 MMT (ie 4- to 4) for improved LE stability to offload knee and improve left knee pain while standing and while negotiating stairs.  Baseline: Hip Flex R/L 4/4, Hip Ext 4-/4-, Hip Abd R/L 4-/4-, Hip Flex R/L 4+/4 Goal status: Ongoing    3.  Pt will decrease 5TSTS by at least 3 seconds in order to demonstrate clinically significant improvement in LE strength   Baseline:  15 sec  Goal status: Ongoing    4.  Patient will perform >=11 chair stands in 30 sec to demonstrate age and gender matched LE endurance where she is not at risk of falling.  Baseline: 9 Goal status: Ongoing    5.  Patient will demonstrate reduced falls risk as evidenced by Dynamic Gait Index (DGI) >19/24. Baseline: 21 Goal status: Deferred       PLAN:   PT FREQUENCY: 1-2x/week   PT DURATION: 10 weeks   PLANNED INTERVENTIONS: Therapeutic exercises, Neuromuscular re-education, Balance training, Gait training, Patient/Family education, Self Care, Joint mobilization, Joint manipulation, Aquatic Therapy, Dry Needling, Electrical stimulation, Spinal manipulation, Spinal mobilization, Cryotherapy, Moist heat, Manual therapy, and Re-evaluation.   PLAN FOR NEXT SESSION:   Reassess goals. Review sleeping position. Practice walking and standing  on uneven surfaces. Progress standing strengthening and balance exercises with decreased UE support and increased resistance. Hip extension with increased resistance or even bird dogs   Ellin Goodie PT, DPT  07/18/2022, 3:46 PM

## 2022-07-25 ENCOUNTER — Ambulatory Visit: Payer: Medicare Other | Admitting: Physical Therapy

## 2022-07-26 ENCOUNTER — Other Ambulatory Visit: Payer: Self-pay | Admitting: Internal Medicine

## 2022-07-26 DIAGNOSIS — F320 Major depressive disorder, single episode, mild: Secondary | ICD-10-CM

## 2022-07-27 NOTE — Telephone Encounter (Signed)
Requested Prescriptions  Pending Prescriptions Disp Refills   buPROPion (WELLBUTRIN XL) 150 MG 24 hr tablet [Pharmacy Med Name: BUPROPION XL  TABLETS (24 H)] 90 tablet 0    Sig: TAKE 1 TABLET(150 MG) BY MOUTH TWICE DAILY     Psychiatry: Antidepressants - bupropion Passed - 07/26/2022 11:17 AM      Passed - Cr in normal range and within 360 days    Creatinine, Ser  Date Value Ref Range Status  03/01/2022 0.93 0.57 - 1.00 mg/dL Final         Passed - AST in normal range and within 360 days    AST  Date Value Ref Range Status  03/01/2022 10 0 - 40 IU/L Final         Passed - ALT in normal range and within 360 days    ALT  Date Value Ref Range Status  03/01/2022 11 0 - 32 IU/L Final         Passed - Completed PHQ-2 or PHQ-9 in the last 360 days      Passed - Last BP in normal range    BP Readings from Last 1 Encounters:  04/17/22 110/70         Passed - Valid encounter within last 6 months    Recent Outpatient Visits           3 months ago COVID-19   [facility] Alba Cory, MD   3 months ago Acute right-sided low back pain with right-sided sciatica   [facility] Danelle Berry, PA-C   5 months ago Annual physical exam   [facility] Danelle Berry, PA-C   7 months ago Gastroesophageal reflux disease with esophagitis without hemorrhage   [facility] [name], DO   1 year ago Vaginal itching   [facility] Berniece Salines, FNP       Future Appointments             In 4 weeks Danelle Berry, PA-C [facility], [facility]

## 2022-08-06 ENCOUNTER — Other Ambulatory Visit: Payer: Self-pay | Admitting: Physician Assistant

## 2022-08-06 DIAGNOSIS — K219 Gastro-esophageal reflux disease without esophagitis: Secondary | ICD-10-CM

## 2022-08-08 NOTE — Telephone Encounter (Signed)
Requested Prescriptions  Pending Prescriptions Disp Refills   valACYclovir (VALTREX) 500 MG tablet [Pharmacy Med Name: VALACYCLOVIR 500MG  TABLETS] 90 tablet 0    Sig: TAKE 1 TABLET(500 MG) BY MOUTH DAILY     Antimicrobials:  Antiviral Agents - Anti-Herpetic Passed - 08/06/2022  6:21 AM      Passed - Valid encounter within last 12 months    Recent Outpatient Visits           3 months ago COVID-19   [facility] Alba Cory, MD   3 months ago Acute right-sided low back pain with right-sided sciatica   [facility] Danelle Berry, PA-C   5 months ago Annual physical exam   [facility] Danelle Berry, PA-C   7 months ago Gastroesophageal reflux disease with esophagitis without hemorrhage   [facility] [name], DO   1 year ago Vaginal itching   [facility] Berniece Salines, FNP       Future Appointments             In 2 weeks Danelle Berry, PA-C [facility], [facility]

## 2022-08-21 DIAGNOSIS — M25512 Pain in left shoulder: Secondary | ICD-10-CM | POA: Diagnosis not present

## 2022-08-25 ENCOUNTER — Ambulatory Visit (INDEPENDENT_AMBULATORY_CARE_PROVIDER_SITE_OTHER): Payer: Medicare Other | Admitting: Family Medicine

## 2022-08-25 ENCOUNTER — Encounter: Payer: Self-pay | Admitting: Family Medicine

## 2022-08-25 VITALS — BP 112/72 | HR 78 | Temp 97.4°F | Resp 16 | Ht 64.0 in | Wt 191.9 lb

## 2022-08-25 DIAGNOSIS — Z6832 Body mass index (BMI) 32.0-32.9, adult: Secondary | ICD-10-CM

## 2022-08-25 DIAGNOSIS — R7303 Prediabetes: Secondary | ICD-10-CM

## 2022-08-25 DIAGNOSIS — K449 Diaphragmatic hernia without obstruction or gangrene: Secondary | ICD-10-CM

## 2022-08-25 DIAGNOSIS — K2100 Gastro-esophageal reflux disease with esophagitis, without bleeding: Secondary | ICD-10-CM

## 2022-08-25 DIAGNOSIS — F320 Major depressive disorder, single episode, mild: Secondary | ICD-10-CM

## 2022-08-25 DIAGNOSIS — E669 Obesity, unspecified: Secondary | ICD-10-CM | POA: Diagnosis not present

## 2022-08-25 DIAGNOSIS — Z760 Encounter for issue of repeat prescription: Secondary | ICD-10-CM

## 2022-08-25 MED ORDER — BUPROPION HCL ER (XL) 150 MG PO TB24
ORAL_TABLET | ORAL | 3 refills | Status: DC
Start: 2022-08-25 — End: 2022-12-20

## 2022-08-25 MED ORDER — PANTOPRAZOLE SODIUM 40 MG PO TBEC
40.0000 mg | DELAYED_RELEASE_TABLET | Freq: Two times a day (BID) | ORAL | 2 refills | Status: DC
Start: 2022-08-25 — End: 2022-11-24

## 2022-08-25 MED ORDER — VALACYCLOVIR HCL 500 MG PO TABS: ORAL_TABLET | ORAL | 2 refills | Status: DC

## 2022-08-25 NOTE — Assessment & Plan Note (Signed)
Known hiatal hernia, she is often symptomatic if she eats late at night she has managed symptoms on Aciphex for many years Last upper endoscopy was in 2016 Her complaint of left upper quadrant abdominal pain with laying down and only at nighttime could possibly be related to GERD and hiatal hernia At this time she declines GI referral for further assessment but she is going to try pantoprazole 40 mg twice daily

## 2022-08-25 NOTE — Assessment & Plan Note (Signed)
she asked about weight loss meds/GLP-1, we discussed diet/lifestyle efforts, calorie deficit and meds, she was encouraged to ask insurance first for what they cover for weight loss F/up appt to further discuss and monitor if she wants to start them Wt Readings from Last 5 Encounters:  08/25/22 191 lb 14.4 oz (87 kg)  04/26/22 190 lb (86.2 kg)  04/17/22 190 lb 11.2 oz (86.5 kg)  02/24/22 191 lb 3.2 oz (86.7 kg)  12/16/21 190 lb 4.8 oz (86.3 kg)   BMI Readings from Last 5 Encounters:  08/25/22 32.94 kg/m  04/26/22 32.61 kg/m  04/17/22 32.73 kg/m  02/24/22 32.82 kg/m  12/16/21 32.66 kg/m

## 2022-08-25 NOTE — Patient Instructions (Addendum)
Reginal Lutes, saxenda and Zepbound Ask your insurance specifically if they cover any of these medications for weight loss (not for diabetes)    Try the protonix twice a day and keep track of your pain symptoms and please let me know if you are continuing to have pain and we will need to get you back to a GI specialist to check you.

## 2022-08-25 NOTE — Progress Notes (Signed)
Name: PRICE PHILLABAUM   MRN: [id number]    DOB: 1956-01-19   Date:08/25/2022       Progress Note  Chief Complaint  Patient presents with   Follow-up   Gastroesophageal Reflux   Depression     Subjective:   Olivia Morgan is a 67 y.o. female, presents to clinic for routine f/up   Left knee pain still monitoring with [facility] - knee cap issue with her total knee - limiting mobility and exercise, not planning on doing surgery right now  Hiatal hernia and GERD on aciphex daily and uses famotidine prn - usually gets sx if she eats late at night   Nighttime LUQ  pain esp with laying down- it feels like pressure, severe - better with changing positions, no other associated sx and no radiation of pain  On wellbutrin 150 mg BID- mood is good Sometimes in the winter when she is more isolated she gets more down and she will sometimes take a third dose    08/25/2022    8:43 AM 04/26/2022    4:17 PM 04/17/2022   11:31 AM  Depression screen PHQ 2/9  Decreased Interest 0 0 0  Down, Depressed, Hopeless 0 0 0  PHQ - 2 Score 0 0 0  Altered sleeping 0 0 0  Tired, decreased energy 0 0 0  Change in appetite 0 0 0  Feeling bad or failure about yourself  0 0 0  Trouble concentrating 0 0 0  Moving slowly or fidgety/restless 0 0 0  Suicidal thoughts 0 0 0  PHQ-9 Score 0 0 0  Difficult doing work/chores Not difficult at all  Not difficult at all         Current Outpatient Medications:    azelastine (ASTELIN) 0.1 % nasal spray, Place into the nose., Disp: , Rfl:    benzonatate (TESSALON) 100 MG capsule, Take 1-2 capsules (100-200 mg total) by mouth 2 (two) times daily as needed for cough., Disp: 30 capsule, Rfl: 1   buPROPion (WELLBUTRIN XL) 150 MG 24 hr tablet, TAKE 1 TABLET(150 MG) BY MOUTH TWICE DAILY, Disp: 90 tablet, Rfl: 0   cholecalciferol (VITAMIN D3) 25 MCG (1000 UNIT) tablet, Take 1,000 Units by mouth daily., Disp: , Rfl:    Dextromethorphan-guaiFENesin (MUCINEX DM) 30-600 MG  TB12, Take 1 tablet by mouth 2 (two) times daily., Disp: 28 tablet, Rfl: 0   diphenhydrAMINE (BENADRYL) 25 MG tablet, Take 25 mg by mouth every 6 (six) hours as needed., Disp: , Rfl:    famotidine (PEPCID) 20 MG tablet, Take 1 tablet (20 mg total) by mouth 2 (two) times daily as needed for heartburn or indigestion., Disp: 60 tablet, Rfl: 1   meloxicam (MOBIC) 15 MG tablet, Take 1 tablet (15 mg total) by mouth daily as needed for pain., Disp: 90 tablet, Rfl: 3   methocarbamol (ROBAXIN) 500 MG tablet, Take 1 tablet (500 mg total) by mouth every 8 (eight) hours as needed for muscle spasms., Disp: 60 tablet, Rfl: 1   Misc. Devices (PULSE OXIMETER FOR FINGER) MISC, 1 each by Does not apply route daily at 12 noon., Disp: 1 each, Rfl: 0   Multiple Vitamins-Minerals (HAIR SKIN AND NAILS FORMULA PO), Take by mouth., Disp: , Rfl:    RABEprazole (ACIPHEX) 20 MG tablet, Take 1 tablet (20 mg total) by mouth daily., Disp: 90 tablet, Rfl: 3   rizatriptan (MAXALT) 5 MG tablet, Take 1 tablet (5 mg total) by mouth as needed for migraine. 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   Dermatitis 02/12/2017   Allergic rhinitis 04/16/2016   Presence of right artificial knee joint 12/16/2015   Obesity (BMI 30.0-34.9) 11/18/2015   Persistent depressive disorder 11/18/2015   Vitamin D deficiency 11/11/2015   Mass of left lower leg 09/07/2015   Adverse drug reaction 06/13/2015   Greater trochanteric bursitis of left hip 06/05/2015   Hammer toe of right foot 06/05/2015   Neuropathy of both feet 06/05/2015    Chronic kidney disease, 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/05/2015   Lower back pain 03/10/2015   OA (osteoarthritis) of hip    GERD (gastroesophageal reflux disease)    Plantar fasciitis    Depression    Genital herpes    Bilateral hearing loss    Abnormal ECG 09/09/2014   Hip pain 09/09/2012   Other mechanical complication of other internal orthopedic device, implant, and graft 09/09/2012   Medial collateral ligament sprain of knee 11/13/2011   Other and unspecified derangement of medial meniscus 06/13/2011   Primary localized osteoarthrosis, lower leg 06/13/2011    Past Surgical History:  Procedure Laterality Date   CESAREAN SECTION     COLONOSCOPY  2010   COLONOSCOPY WITH PROPOFOL N/A 11/27/2018   Procedure: COLONOSCOPY WITH PROPOFOL;  Surgeon: Toney Reil, MD;  Location: [facility];  Service: Endoscopy;  Laterality: N/A;   ENDOSCOPIC CONCHA BULLOSA RESECTION Left 05/23/2018   Procedure: ENDOSCOPIC CONCHA BULLOSA RESECTION;  Surgeon: Vernie Murders, MD;  Location: [facility];  Service: ENT;  Laterality: Left;   FOOT SURGERY Right    REPLACEMENT TOTAL KNEE  3/13   Dr[facility]Hallows at [facility]   SEPTOPLASTY Bilateral 05/23/2018   Procedure: SEPTOPLASTY;  Surgeon: Vernie Murders, MD;  Location: [facility];  Service: ENT;  Laterality: Bilateral;   TOTAL HIP ARTHROPLASTY Left 10/2008   TURBINATE REDUCTION Bilateral 05/23/2018   Procedure: INFERIOR TURBINATE REDUCTION;  Surgeon: Vernie Murders, MD;  Location: [facility];  Service: ENT;  Laterality: Bilateral;   UPPER GI ENDOSCOPY  1992    Family History  Problem Relation Age of Onset   Aneurysm Mother    Stroke Mother    Heart disease Mother    Heart disease Father 78   Hypertension Father    Lung disease Father    Heart disease Paternal Grandfather    Panic disorder Brother    Milk intolerance Daughter    Polycystic ovary syndrome Daughter    Obesity Brother    Hyperlipidemia Brother     Hypertension Brother    Diabetes Brother    Cancer Neg Hx    COPD Neg Hx    Breast cancer Neg Hx     Social History   Tobacco Use   Smoking status: Never   Smokeless tobacco: Never  Vaping Use   Vaping Use: Never used  Substance Use Topics   Alcohol use: Yes    Comment: socially   Drug use: No     Allergies  Allergen Reactions   Levofloxacin Hives  Morphine Nausea Only   Celecoxib Other (See Comments)    SOB   Celexa [Citalopram Hydrobromide]    Citrullus Vulgaris Swelling    Per pt says causes throat to swell   Cymbalta [Duloxetine Hcl] Hives   Gabapentin Other (See Comments)    anger   Levaquin [Levofloxacin In D5w] Hives   Valium [Diazepam] Other (See Comments)    hallucinations   Monistat [Miconazole] Rash   Prednisone Anxiety    Health Maintenance  Topic Date Due   Medicare Annual Wellness (AWV)  07/15/2022   COVID-19 Vaccine (3 - 2023-24 season) 09/10/2022 (Originally 12/09/2021)   INFLUENZA VACCINE  11/09/2022   MAMMOGRAM  03/29/2023   DEXA SCAN  10/05/2023   COLONOSCOPY (Pts 45-45yrs Insurance coverage will need to be confirmed)  11/26/2028   Pneumonia Vaccine 36+ Years old  Completed   Hepatitis C Screening  Completed   Zoster Vaccines- Shingrix  Completed   HPV VACCINES  Aged Out   DTaP/Tdap/Td  Discontinued    Chart Review Today: I personally reviewed active problem list, medication list, allergies, family history, social history, health maintenance, notes from last encounter, lab results, imaging with the patient/caregiver today.   Review of Systems  Constitutional: Negative.   HENT: Negative.    Eyes: Negative.   Respiratory: Negative.    Cardiovascular: Negative.   Gastrointestinal: Negative.   Endocrine: Negative.   Genitourinary: Negative.   Musculoskeletal: Negative.   Skin: Negative.   Allergic/Immunologic: Negative.   Neurological: Negative.   Hematological: Negative.   Psychiatric/Behavioral: Negative.    All other systems  reviewed and are negative.    Objective:   Vitals:   08/25/22 0848  BP: 112/72  Pulse: 78  Resp: 16  Temp: (!) 97.4 F (36.3 C)  TempSrc: Oral  SpO2: 96%  Weight: 191 lb 14.4 oz (87 kg)  Height: 5\' 4"  (1.626 m)    Body mass index is 32.94 kg/m.  Physical Exam Vitals and nursing note reviewed.  Constitutional:      General: She is not in acute distress.    Appearance: Normal appearance. She is well-developed. She is obese. She is not ill-appearing, toxic-appearing or diaphoretic.  HENT:     Head: Normocephalic and atraumatic.     Nose: Nose normal.  Eyes:     General:        Right eye: No discharge.        Left eye: No discharge.     Conjunctiva/sclera: Conjunctivae normal.  Neck:     Trachea: No tracheal deviation.  Cardiovascular:     Rate and Rhythm: Normal rate and regular rhythm.     Pulses: Normal pulses.     Heart sounds: Normal heart sounds. No murmur heard.    No friction rub. No gallop.  Pulmonary:     Effort: Pulmonary effort is normal. No respiratory distress.     Breath sounds: Normal breath sounds. No stridor.  Abdominal:     General: Bowel sounds are normal.     Palpations: Abdomen is soft.  Musculoskeletal:        General: Normal range of motion.  Skin:    General: Skin is warm and dry.     Findings: No rash.  Neurological:     Mental Status: She is alert.     Motor: No abnormal muscle tone.     Coordination: Coordination normal.     Gait: Gait abnormal.  Psychiatric:        Mood and Affect: Mood  normal.        Behavior: Behavior normal.         Assessment & Plan:   Problem List Items Addressed This Visit       Digestive   GERD (gastroesophageal reflux disease) - Primary    Known hiatal hernia, she is often symptomatic if she eats late at night she has managed symptoms on Aciphex for many years Last upper endoscopy was in 2016 Her complaint of left upper quadrant abdominal pain with laying down and only at nighttime could  possibly be related to GERD and hiatal hernia At this time she declines GI referral for further assessment but she is going to try pantoprazole 40 mg twice daily      Relevant Medications   pantoprazole (PROTONIX) 40 MG tablet     Other   Depression    Mood and symptoms well-controlled with Wellbutrin 150 mg twice daily, PHQ-9 was reviewed and negative today      Relevant Medications   buPROPion (WELLBUTRIN XL) 150 MG 24 hr tablet   Class 1 obesity with body mass index (BMI) of 32.0 to 32.9 in adult    she asked about weight loss meds/GLP-1, we discussed diet/lifestyle efforts, calorie deficit and meds, she was encouraged to ask insurance first for what they cover for weight loss F/up appt to further discuss and monitor if she wants to start them Wt Readings from Last 5 Encounters:  08/25/22 191 lb 14.4 oz (87 kg)  04/26/22 190 lb (86.2 kg)  04/17/22 190 lb 11.2 oz (86.5 kg)  02/24/22 191 lb 3.2 oz (86.7 kg)  12/16/21 190 lb 4.8 oz (86.3 kg)   BMI Readings from Last 5 Encounters:  08/25/22 32.94 kg/m  04/26/22 32.61 kg/m  04/17/22 32.73 kg/m  02/24/22 32.82 kg/m  12/16/21 32.66 kg/m        Prediabetes    Lab Results  Component Value Date   HGBA1C 5.7 (H) 03/01/2022   HGBA1C 5.7 (H) 03/02/2021   HGBA1C 5.4 11/22/2017  Last 2 labs are in prediabetic range, no sig weight or lifestyle changes - discussed option to monitor labs every 6 month vs 12 months, she will do annually unless it starts worsening       Other Visit Diagnoses     Hiatal hernia       Recommended following up with GI for further assessment of her intermittent left upper quadrant pain   Relevant Medications   pantoprazole (PROTONIX) 40 MG tablet   Medication refill       Relevant Medications   valACYclovir (VALTREX) 500 MG tablet        Return in about 6 months (around 02/25/2023) for Annual Physical, come sooner for f/up on left side pain.   Danelle Berry, PA-C 08/25/22 9:00 AM

## 2022-08-25 NOTE — Assessment & Plan Note (Signed)
Mood and symptoms well-controlled with Wellbutrin 150 mg twice daily, PHQ-9 was reviewed and negative today

## 2022-08-25 NOTE — Assessment & Plan Note (Signed)
Lab Results  Component Value Date   HGBA1C 5.7 (H) 03/01/2022   HGBA1C 5.7 (H) 03/02/2021   HGBA1C 5.4 11/22/2017  Last 2 labs are in prediabetic range, no sig weight or lifestyle changes - discussed option to monitor labs every 6 month vs 12 months, she will do annually unless it starts worsening

## 2022-08-28 DIAGNOSIS — H2512 Age-related nuclear cataract, left eye: Secondary | ICD-10-CM | POA: Diagnosis not present

## 2022-08-28 DIAGNOSIS — M3501 Sicca syndrome with keratoconjunctivitis: Secondary | ICD-10-CM | POA: Diagnosis not present

## 2022-08-28 DIAGNOSIS — H2511 Age-related nuclear cataract, right eye: Secondary | ICD-10-CM | POA: Diagnosis not present

## 2022-08-28 DIAGNOSIS — H5213 Myopia, bilateral: Secondary | ICD-10-CM | POA: Diagnosis not present

## 2022-08-28 DIAGNOSIS — Z0100 Encounter for examination of eyes and vision without abnormal findings: Secondary | ICD-10-CM | POA: Diagnosis not present

## 2022-08-28 DIAGNOSIS — H43393 Other vitreous opacities, bilateral: Secondary | ICD-10-CM | POA: Diagnosis not present

## 2022-08-28 DIAGNOSIS — H35372 Puckering of macula, left eye: Secondary | ICD-10-CM | POA: Diagnosis not present

## 2022-08-28 DIAGNOSIS — H35379 Puckering of macula, unspecified eye: Secondary | ICD-10-CM | POA: Diagnosis not present

## 2022-08-28 DIAGNOSIS — H2513 Age-related nuclear cataract, bilateral: Secondary | ICD-10-CM | POA: Diagnosis not present

## 2022-10-13 ENCOUNTER — Ambulatory Visit (INDEPENDENT_AMBULATORY_CARE_PROVIDER_SITE_OTHER): Payer: Medicare Other

## 2022-10-13 VITALS — Ht 64.0 in | Wt 191.0 lb

## 2022-10-13 DIAGNOSIS — Z0000 Encounter for general adult medical examination without abnormal findings: Secondary | ICD-10-CM

## 2022-10-13 NOTE — Progress Notes (Signed)
Subjective:   Olivia Morgan is a 67 y.o. female who presents for Medicare Annual (Subsequent) preventive examination.  Visit Complete: Virtual  I connected with  Charny A Stephens on 10/13/22 by a audio enabled telemedicine application and verified that I am speaking with the correct person using two identifiers.  Patient Location: Home  Provider Location: Office/Clinic  I discussed the limitations of evaluation and management by telemedicine. The patient expressed understanding and agreed to proceed.  Patient Medicare AWV questionnaire was completed by the patient on (not done); I have confirmed that all information answered by patient is correct and no changes since this date.  Review of Systems         Objective:    There were no vitals filed for this visit. There is no height or weight on file to calculate BMI.     06/14/2022    3:54 PM 07/14/2021    9:31 AM 11/27/2018    8:56 AM 05/23/2018   10:08 AM 02/06/2017    1:06 PM 11/08/2016   11:14 AM 04/05/2016    3:58 PM  Advanced Directives  Does Patient Have a Medical Advance Directive? Yes Yes Yes Yes Yes Yes Yes  Type of Estate agent of [place];Living will Out of facility DNR (pink MOST or yellow form) Healthcare Power of [place];Living will Healthcare Power of [place];Living will Healthcare Power of [place];Living will  Living will  Does patient want to make changes to medical advance directive? No - Patient declined Yes (MAU/Ambulatory/Procedural Areas - Information given) No - Patient declined      Copy of Healthcare Power of Attorney in Chart?   No - copy requested No - copy requested       Current Medications (verified) Outpatient Encounter Medications as of 10/13/2022  Medication Sig   azelastine (ASTELIN) 0.1 % nasal spray Place into the nose.   benzonatate (TESSALON) 100 MG capsule Take 1-2 capsules (100-200 mg total) by mouth 2 (two) times daily as needed for cough.   buPROPion (WELLBUTRIN  XL) 150 MG 24 hr tablet TAKE 1 TABLET(150 MG) BY MOUTH TWICE DAILY   cholecalciferol (VITAMIN D3) 25 MCG (1000 UNIT) tablet Take 1,000 Units by mouth daily.   Dextromethorphan-guaiFENesin (MUCINEX DM) 30-600 MG TB12 Take 1 tablet by mouth 2 (two) times daily.   diphenhydrAMINE (BENADRYL) 25 MG tablet Take 25 mg by mouth every 6 (six) hours as needed.   famotidine (PEPCID) 20 MG tablet Take 1 tablet (20 mg total) by mouth 2 (two) times daily as needed for heartburn or indigestion.   meloxicam (MOBIC) 15 MG tablet Take 1 tablet (15 mg total) by mouth daily as needed for pain.   methocarbamol (ROBAXIN) 500 MG tablet Take 1 tablet (500 mg total) by mouth every 8 (eight) hours as needed for muscle spasms.   Misc. Devices (PULSE OXIMETER FOR FINGER) MISC 1 each by Does not apply route daily at 12 noon.   Multiple Vitamins-Minerals (HAIR SKIN AND NAILS FORMULA PO) Take by mouth.   pantoprazole (PROTONIX) 40 MG tablet Take 1 tablet (40 mg total) by mouth 2 (two) times daily.   rizatriptan (MAXALT) 5 MG tablet Take 1 tablet (5 mg total) by mouth as needed for migraine. May repeat in 2 hours if needed   valACYclovir (VALTREX) 500 MG tablet TAKE 1 TABLET(500 MG) BY MOUTH DAILY   vitamin E 180 MG (400 UNITS) capsule Take 400 Units by mouth daily.   No facility-administered encounter medications on file as  of 10/13/2022.    Allergies (verified) Levofloxacin, Morphine, Celecoxib, Celexa [citalopram hydrobromide], Citrullus vulgaris, Cymbalta [duloxetine hcl], Gabapentin, Levaquin [levofloxacin in d5w], Valium [diazepam], Monistat [miconazole], and Prednisone   History: Past Medical History:  Diagnosis Date   Acute pain of left shoulder 06/08/2020   Allergic reaction 02/12/2017   Bilateral hearing loss    wears hearing aids   Depression    Genital herpes    GERD (gastroesophageal reflux disease)    Herpes simplex virus infection    Murmur    Neuropathy of both feet 06/05/2015   OA (osteoarthritis) of hip     left   Osteopenia of lumbar spine 2010   Plantar fasciitis    Stomatitis    Past Surgical History:  Procedure Laterality Date   CESAREAN SECTION     COLONOSCOPY  2010   COLONOSCOPY WITH PROPOFOL N/A 11/27/2018   Procedure: COLONOSCOPY WITH PROPOFOL;  Surgeon: Toney Reil, MD;  Location: [facility];  Service: Endoscopy;  Laterality: N/A;   ENDOSCOPIC CONCHA BULLOSA RESECTION Left 05/23/2018   Procedure: ENDOSCOPIC CONCHA BULLOSA RESECTION;  Surgeon: Vernie Murders, MD;  Location: [facility];  Service: ENT;  Laterality: Left;   FOOT SURGERY Right    REPLACEMENT TOTAL KNEE  3/13   Dr[facility]Hallows at [facility]   SEPTOPLASTY Bilateral 05/23/2018   Procedure: SEPTOPLASTY;  Surgeon: Vernie Murders, MD;  Location: [facility];  Service: ENT;  Laterality: Bilateral;   TOTAL HIP ARTHROPLASTY Left 10/2008   TURBINATE REDUCTION Bilateral 05/23/2018   Procedure: INFERIOR TURBINATE REDUCTION;  Surgeon: Vernie Murders, MD;  Location: [facility];  Service: ENT;  Laterality: Bilateral;   UPPER GI ENDOSCOPY  1992   Family History  Problem Relation Age of Onset   Aneurysm Mother    Stroke Mother    Heart disease Mother    Heart disease Father 93   Hypertension Father    Lung disease Father    Heart disease Paternal Grandfather    Panic disorder Brother    Milk intolerance Daughter    Polycystic ovary syndrome Daughter    Obesity Brother    Hyperlipidemia Brother    Hypertension Brother    Diabetes Brother    Cancer Neg Hx    COPD Neg Hx    Breast cancer Neg Hx    Social History   Socioeconomic History   Marital status: Divorced    Spouse name: Not on file   Number of children: 1   Years of education: Not on file   Highest education level: Not on file  Occupational History   Not on file  Tobacco Use   Smoking status: Never   Smokeless tobacco: Never  Vaping Use   Vaping Use: Never used  Substance and Sexual Activity   Alcohol use: Yes     Comment: socially   Drug use: No   Sexual activity: Yes    Birth control/protection: Post-menopausal  Other Topics Concern   Not on file  Social History Narrative   Not on file   Social Determinants of Health   Financial Resource Strain: Low Risk  (02/24/2022)   Overall Financial Resource Strain (CARDIA)    Difficulty of Paying Living Expenses: Not hard at all  Food Insecurity: No Food Insecurity (02/24/2022)   Hunger Vital Sign    Worried About Running Out of Food in the Last Year: Never true    Ran Out of Food in the Last Year: Never true  Transportation Needs: No Transportation  Needs (02/24/2022)   PRAPARE - Administrator, Civil Service (Medical): No    Lack of Transportation (Non-Medical): No  Physical Activity: Insufficiently Active (02/24/2022)   Exercise Vital Sign    Days of Exercise per Week: 2 days    Minutes of Exercise per Session: 20 min  Stress: No Stress Concern Present (02/24/2022)   [employer] of Occupational Health - Occupational Stress Questionnaire    Feeling of Stress : Only a little  Social Connections: Moderately Isolated (02/24/2022)   Social Connection and Isolation Panel [NHANES]    Frequency of Communication with Friends and Family: More than three times a week    Frequency of Social Gatherings with Friends and Family: More than three times a week    Attends Religious Services: Never    Database administrator or Organizations: Yes    Attends Engineer, structural: More than 4 times per year    Marital Status: Divorced    Tobacco Counseling Counseling given: Not Answered   Clinical Intake:                        Activities of Daily Living    08/25/2022    8:43 AM 04/26/2022    4:18 PM  In your present state of health, do you have any difficulty performing the following activities:  Hearing? 1 1  Vision? 0 0  Difficulty concentrating or making decisions? 0 0  Walking or climbing stairs? 0 0   Dressing or bathing? 0 0  Doing errands, shopping? 0 0    Patient Care Team: Danelle Berry, PA-C as PCP - General (Family Medicine) Hallows, Rhett K, MD (Orthopedic Surgery)  Indicate any recent Medical Services you may have received from other than [facility] providers in the past year (date may be approximate).     Assessment:   This is a routine wellness examination for Vilda.  Hearing/Vision screen No results found.  Dietary issues and exercise activities discussed:     Goals Addressed   None    Depression Screen    08/25/2022    8:43 AM 04/26/2022    4:17 PM 04/17/2022   11:31 AM 02/24/2022   10:39 AM 12/16/2021   10:50 AM 07/14/2021    9:30 AM 05/05/2021    2:12 PM  PHQ 2/9 Scores  PHQ - 2 Score 0 0 0 0 0 0 0  PHQ- 9 Score 0 0 0 0 0  0    Fall Risk    08/25/2022    8:43 AM 04/26/2022    4:17 PM 04/17/2022   11:31 AM 02/24/2022   10:39 AM 12/16/2021   10:50 AM  Fall Risk   Falls in the past year? 1 1 1  0 0  Number falls in past yr: 0 0 0 0 0  Injury with Fall? 1 0 0 0 0  Risk for fall due to : Impaired balance/gait History of fall(s) Impaired balance/gait No Fall Risks No Fall Risks  Follow up Falls prevention discussed;Education provided;Falls evaluation completed Falls prevention discussed;Education provided Falls prevention discussed;Education provided;Falls evaluation completed Falls prevention discussed;Education provided;Falls evaluation completed Falls prevention discussed;Education provided    MEDICARE RISK AT HOME:   TIMED UP AND GO:  Was the test performed?  No    Cognitive Function:        Immunizations Immunization History  Administered Date(s) Administered   Influenza-Unspecified 02/09/2015   Moderna Sars-Covid-2 Vaccination 07/05/2019, 08/02/2019   Pneumococcal  Conjugate-13 12/29/2019   Pneumococcal Polysaccharide-23 02/23/2021   Tdap 04/11/2011   Zoster Recombinant(Shingrix) 05/16/2019, 09/29/2019    TDAP status: Up to date  Flu Vaccine  status: Up to date  Pneumococcal vaccine status: Up to date  Covid-19 vaccine status: Completed vaccines  Qualifies for Shingles Vaccine? Yes   Zostavax completed Yes   Shingrix Completed?: Yes  Screening Tests Health Maintenance  Topic Date Due   COVID-19 Vaccine (3 - 2023-24 season) 12/09/2021   Medicare Annual Wellness (AWV)  07/15/2022   INFLUENZA VACCINE  11/09/2022   MAMMOGRAM  03/29/2023   DEXA SCAN  10/05/2023   Colonoscopy  11/26/2028   Pneumonia Vaccine 43+ Years old  Completed   Hepatitis C Screening  Completed   Zoster Vaccines- Shingrix  Completed   HPV VACCINES  Aged Out   DTaP/Tdap/Td  Discontinued    Health Maintenance  Health Maintenance Due  Topic Date Due   COVID-19 Vaccine (3 - 2023-24 season) 12/09/2021   Medicare Annual Wellness (AWV)  07/15/2022    Colorectal cancer screening: Type of screening: Colonoscopy. Completed yes. Repeat every 10 years  Mammogram status: Completed yes. Repeat every year  Bone Density status: Completed yes. Results reflect: Bone density results: NORMAL. Repeat every 5 years.  Lung Cancer Screening: (Low Dose CT Chest recommended if Age 80-80 years, 20 pack-year currently smoking OR have quit w/in 15years.) does not qualify.   Lung Cancer Screening Referral: no  Additional Screening:  Hepatitis C Screening: does not qualify; Completed yes  Vision Screening: Recommended annual ophthalmology exams for early detection of glaucoma and other disorders of the eye. Is the patient up to date with their annual eye exam?  Yes  Who is the provider or what is the name of the office in which the patient attends annual eye exams? Dr Lillie Fragmin If pt is not established with a provider, would they like to be referred to a provider to establish care? No .   Dental Screening: Recommended annual dental exams for proper oral hygiene  Diabetic Foot Exam: n/a  Community Resource Referral / Chronic Care Management: CRR required this  visit?  No   CCM required this visit?  No     Plan:     I have personally reviewed and noted the following in the patient's chart:   Medical and social history Use of alcohol, tobacco or illicit drugs  Current medications and supplements including opioid prescriptions. Patient is not currently taking opioid prescriptions. Functional ability and status Nutritional status Physical activity Advanced directives List of other physicians Hospitalizations, surgeries, and ER visits in previous 12 months Vitals Screenings to include cognitive, depression, and falls Referrals and appointments  In addition, I have reviewed and discussed with patient certain preventive protocols, quality metrics, and best practice recommendations. A written personalized care plan for preventive services as well as general preventive health recommendations were provided to patient.     Sue Lush, LPN   04/14/7827   After Visit Summary: (MyChart) Due to this being a telephonic visit, the after visit summary with patients personalized plan was offered to patient via MyChart   Nurse Notes: The patient states she is doing well and has no concerns or questions at this time.

## 2022-11-02 ENCOUNTER — Other Ambulatory Visit: Payer: Self-pay | Admitting: Internal Medicine

## 2022-11-02 DIAGNOSIS — K2100 Gastro-esophageal reflux disease with esophagitis, without bleeding: Secondary | ICD-10-CM

## 2022-11-02 NOTE — Telephone Encounter (Signed)
Requested Prescriptions  Refused Prescriptions Disp Refills   RABEprazole (ACIPHEX) 20 MG tablet [Pharmacy Med Name: RABEPRAZOLE DR 20MG  TABLETS] 90 tablet 3    Sig: TAKE 1 TABLET(20 MG) BY MOUTH DAILY     Gastroenterology: Proton Pump Inhibitors Passed - 11/02/2022  3:43 AM      Passed - Valid encounter within last 12 months    Recent Outpatient Visits           2 months ago Gastroesophageal reflux disease with esophagitis without hemorrhage   [facility] Danelle Berry, PA-C   6 months ago COVID-19   [facility] Alba Cory, MD   6 months ago Acute right-sided low back pain with right-sided sciatica   [facility] Danelle Berry, PA-C   8 months ago Annual physical exam   [facility] Danelle Berry, PA-C   10 months ago Gastroesophageal reflux disease with esophagitis without hemorrhage   [facility] [name], [place]

## 2022-11-06 DIAGNOSIS — H9313 Tinnitus, bilateral: Secondary | ICD-10-CM | POA: Diagnosis not present

## 2022-11-06 DIAGNOSIS — M26609 Unspecified temporomandibular joint disorder, unspecified side: Secondary | ICD-10-CM | POA: Diagnosis not present

## 2022-11-06 DIAGNOSIS — H903 Sensorineural hearing loss, bilateral: Secondary | ICD-10-CM | POA: Diagnosis not present

## 2022-11-24 ENCOUNTER — Other Ambulatory Visit: Payer: Self-pay | Admitting: Family Medicine

## 2022-11-24 DIAGNOSIS — K2100 Gastro-esophageal reflux disease with esophagitis, without bleeding: Secondary | ICD-10-CM

## 2022-11-24 DIAGNOSIS — K449 Diaphragmatic hernia without obstruction or gangrene: Secondary | ICD-10-CM

## 2022-11-29 DIAGNOSIS — H2511 Age-related nuclear cataract, right eye: Secondary | ICD-10-CM | POA: Diagnosis not present

## 2022-11-29 DIAGNOSIS — H2512 Age-related nuclear cataract, left eye: Secondary | ICD-10-CM | POA: Diagnosis not present

## 2022-11-29 DIAGNOSIS — M3501 Sicca syndrome with keratoconjunctivitis: Secondary | ICD-10-CM | POA: Diagnosis not present

## 2022-12-13 ENCOUNTER — Encounter: Payer: Self-pay | Admitting: Ophthalmology

## 2022-12-16 ENCOUNTER — Other Ambulatory Visit: Payer: Self-pay | Admitting: Family Medicine

## 2022-12-16 DIAGNOSIS — F320 Major depressive disorder, single episode, mild: Secondary | ICD-10-CM

## 2022-12-18 ENCOUNTER — Telehealth: Payer: Self-pay | Admitting: Family Medicine

## 2022-12-18 NOTE — Telephone Encounter (Signed)
Medication Refill - Medication: buPROPion HCl 150 MG Denied  twice,pt says refill not appropiate, pt says she said yes to 2 rx by accident and now she only has 4 left  Has the patient contacted their pharmacy? yes ( (Agent: If yes, when and what did the pharmacy advise?)contact pcp  Preferred Pharmacy (with phone number or street name):  [facility][id number] Nicholes Rough, [place] - [address] AT [facility] [address] Phone: [phone]  Fax: [phone]     Has the patient been seen for an appointment in the last year OR does the patient have an upcoming appointment? yes  Agent: Please be advised that RX refills may take up to 3 business days. We ask that you follow-up with your pharmacy.

## 2022-12-20 ENCOUNTER — Other Ambulatory Visit: Payer: Self-pay | Admitting: Family Medicine

## 2022-12-20 DIAGNOSIS — F320 Major depressive disorder, single episode, mild: Secondary | ICD-10-CM

## 2022-12-20 MED ORDER — BUPROPION HCL ER (XL) 150 MG PO TB24: ORAL_TABLET | ORAL | 0 refills | Status: DC

## 2022-12-20 NOTE — Telephone Encounter (Signed)
Requested Prescriptions  Pending Prescriptions Disp Refills   buPROPion (WELLBUTRIN XL) 150 MG 24 hr tablet 180 tablet 0    Sig: TAKE 1 TABLET(150 MG) BY MOUTH TWICE DAILY     Psychiatry: Antidepressants - bupropion Passed - 12/20/2022  3:51 PM      Passed - Cr in normal range and within 360 days    Creatinine, Ser  Date Value Ref Range Status  03/01/2022 0.93 0.57 - 1.00 mg/dL Final         Passed - AST in normal range and within 360 days    AST  Date Value Ref Range Status  03/01/2022 10 0 - 40 IU/L Final         Passed - ALT in normal range and within 360 days    ALT  Date Value Ref Range Status  03/01/2022 11 0 - 32 IU/L Final         Passed - Completed PHQ-2 or PHQ-9 in the last 360 days      Passed - Last BP in normal range    BP Readings from Last 1 Encounters:  08/25/22 112/72         Passed - Valid encounter within last 6 months    Recent Outpatient Visits           3 months ago Gastroesophageal reflux disease with esophagitis without hemorrhage   [facility] Danelle Berry, PA-C   7 months ago COVID-19   [facility] [place], Danna Hefty, MD   8 months ago Acute right-sided low back pain with right-sided sciatica   [facility] Danelle Berry, PA-C   9 months ago Annual physical exam   [facility] Danelle Berry, PA-C   1 year ago Gastroesophageal reflux disease with esophagitis without hemorrhage   [facility] [name], DO       Future Appointments             In 2 months Danelle Berry, PA-C [facility]

## 2022-12-20 NOTE — Telephone Encounter (Signed)
Medication Refill - Medication: buPROPion (WELLBUTRIN XL) 150 MG 24 hr tablet [36775]   (patient would like a call regarding the status)   Has the patient contacted their pharmacy? Yes.      Preferred Pharmacy (with phone number or street name): [facility] #[id number] Nicholes Rough, [place] - [address] AT [facility] [address]  [address] [place], [place] [address]  Phone:  [phone]  Fax:  [phone]  DEA #:  [id number] DAW Reason: --     Has the patient been seen for an appointment in the last year OR does the patient have an upcoming appointment? Yes.    Agent: Please be advised that RX refills may take up to 3 business days. We ask that you follow-up with your pharmacy.

## 2022-12-20 NOTE — Telephone Encounter (Signed)
[facility] called and spoke to [place], Pensions consultant about the refill(s) bupropion requested. Advised it was sent on 08/25/22 #180/3 refill(s). He says it shows the patient picked up a 3 month supply on 09/27/22, then the Rx was transferred to [facility] in [place], [place] and they closed it out. He says she will need a new Rx sent if the patient is wanting a refill. Advised it will be sent in.

## 2022-12-29 NOTE — Discharge Instructions (Signed)

## 2023-01-02 ENCOUNTER — Encounter: Payer: Self-pay | Admitting: Ophthalmology

## 2023-01-02 ENCOUNTER — Encounter
Admission: RE | Disposition: A | Discharge status: Home / Self Care | Payer: Self-pay | Source: Home / Self Care | Attending: Ophthalmology

## 2023-01-02 ENCOUNTER — Ambulatory Visit
Admission: RE | Admit: 2023-01-02 | Discharge: 2023-01-02 | Disposition: A | Discharge status: Home / Self Care | Payer: Medicare Other | Attending: Ophthalmology | Admitting: Ophthalmology

## 2023-01-02 ENCOUNTER — Ambulatory Visit: Payer: Medicare Other | Admitting: Anesthesiology

## 2023-01-02 ENCOUNTER — Other Ambulatory Visit: Payer: Self-pay

## 2023-01-02 DIAGNOSIS — M1990 Unspecified osteoarthritis, unspecified site: Secondary | ICD-10-CM | POA: Diagnosis not present

## 2023-01-02 DIAGNOSIS — K219 Gastro-esophageal reflux disease without esophagitis: Secondary | ICD-10-CM | POA: Diagnosis not present

## 2023-01-02 DIAGNOSIS — H2511 Age-related nuclear cataract, right eye: Secondary | ICD-10-CM | POA: Diagnosis not present

## 2023-01-02 DIAGNOSIS — G709 Myoneural disorder, unspecified: Secondary | ICD-10-CM | POA: Diagnosis not present

## 2023-01-02 DIAGNOSIS — F341 Dysthymic disorder: Secondary | ICD-10-CM | POA: Diagnosis not present

## 2023-01-02 DIAGNOSIS — F32A Depression, unspecified: Secondary | ICD-10-CM | POA: Insufficient documentation

## 2023-01-02 HISTORY — DX: Encounter for other preprocedural examination: Z91.89

## 2023-01-02 HISTORY — DX: Personal history of other diseases of the nervous system and sense organs: Z86.69

## 2023-01-02 HISTORY — DX: Presence of external hearing-aid: Z97.4

## 2023-01-02 HISTORY — DX: Other specified personal risk factors, not elsewhere classified: Z01.818

## 2023-01-02 HISTORY — PX: CATARACT EXTRACTION W/PHACO: SHX586

## 2023-01-02 SURGERY — PHACOEMULSIFICATION, CATARACT, WITH IOL INSERTION
Anesthesia: Monitor Anesthesia Care | Site: Eye | Laterality: Right

## 2023-01-02 MED ORDER — SIGHTPATH DOSE#1 BSS IO SOLN
INTRAOCULAR | Status: DC | PRN
  Administered 2023-01-02: 15 mL via INTRAOCULAR

## 2023-01-02 MED ORDER — SIGHTPATH DOSE#1 BSS IO SOLN
INTRAOCULAR | Status: DC | PRN
  Administered 2023-01-02: 65 mL via OPHTHALMIC

## 2023-01-02 MED ORDER — BRIMONIDINE TARTRATE-TIMOLOL 0.2-0.5 % OP SOLN
OPHTHALMIC | Status: DC | PRN
  Administered 2023-01-02: 1 [drp] via OPHTHALMIC

## 2023-01-02 MED ORDER — CEFUROXIME OPHTHALMIC INJECTION 1 MG/0.1 ML
INJECTION | OPHTHALMIC | Status: DC | PRN
  Administered 2023-01-02: .1 mL via INTRACAMERAL

## 2023-01-02 MED ORDER — SIGHTPATH DOSE#1 NA CHONDROIT SULF-NA HYALURON 40-17 MG/ML IO SOLN
INTRAOCULAR | Status: DC | PRN
  Administered 2023-01-02: 1 mL via INTRAOCULAR

## 2023-01-02 MED ORDER — FENTANYL CITRATE (PF) 100 MCG/2ML IJ SOLN
INTRAMUSCULAR | Status: AC
  Filled 2023-01-02: qty 2

## 2023-01-02 MED ORDER — MIDAZOLAM HCL 2 MG/2ML IJ SOLN
INTRAMUSCULAR | Status: DC | PRN
  Administered 2023-01-02: 2 mg via INTRAVENOUS

## 2023-01-02 MED ORDER — ARMC OPHTHALMIC DILATING DROPS
1.0000 | OPHTHALMIC | Status: DC | PRN
  Administered 2023-01-02 (×3): 1 via OPHTHALMIC

## 2023-01-02 MED ORDER — FENTANYL CITRATE (PF) 100 MCG/2ML IJ SOLN
INTRAMUSCULAR | Status: DC | PRN
  Administered 2023-01-02: 100 ug via INTRAVENOUS

## 2023-01-02 MED ORDER — SIGHTPATH DOSE#1 BSS IO SOLN
INTRAOCULAR | Status: DC | PRN
  Administered 2023-01-02: 2 mL

## 2023-01-02 MED ORDER — MIDAZOLAM HCL 2 MG/2ML IJ SOLN
INTRAMUSCULAR | Status: AC
  Filled 2023-01-02: qty 2

## 2023-01-02 MED ORDER — LACTATED RINGERS IV SOLN: INTRAVENOUS | Status: DC

## 2023-01-02 MED ORDER — TETRACAINE HCL 0.5 % OP SOLN
1.0000 [drp] | OPHTHALMIC | Status: DC | PRN
  Administered 2023-01-02 (×3): 1 [drp] via OPHTHALMIC

## 2023-01-02 MED ORDER — TETRACAINE HCL 0.5 % OP SOLN
OPHTHALMIC | Status: AC
  Filled 2023-01-02: qty 4

## 2023-01-02 SURGICAL SUPPLY — 11 items
ANGLE REVERSE CUT SHRT 25GA (CUTTER) ×1
CATARACT SUITE SIGHTPATH (MISCELLANEOUS) ×1
CYSTOTOME ANGL RVRS SHRT 25G (CUTTER) ×1 IMPLANT
FEE CATARACT SUITE SIGHTPATH (MISCELLANEOUS) ×1 IMPLANT
GLOVE BIOGEL PI IND STRL 8 (GLOVE) ×1 IMPLANT
GLOVE SURG LX STRL 8.0 MICRO (GLOVE) ×1 IMPLANT
LENS CLAREON VIVITY 7.5 ×1 IMPLANT
LENS IOL CLRN VT TRC 3 7.5 IMPLANT
NDL FILTER BLUNT 18X1 1/2 (NEEDLE) ×1 IMPLANT
NEEDLE FILTER BLUNT 18X1 1/2 (NEEDLE) ×1
SYR 3ML LL SCALE MARK (SYRINGE) ×1 IMPLANT

## 2023-01-02 NOTE — H&P (Signed)
[facility]   Primary Care Physician:  Danelle Berry, PA-C Ophthalmologist: Dr. Druscilla Brownie  Pre-Procedure History & Physical: HPI:  Olivia Morgan is a 67 y.o. female here for cataract surgery.   Past Medical History:  Diagnosis Date   Acute pain of left shoulder 06/08/2020   Allergic reaction 02/12/2017   At risk for difficult intubation on preoperative anesthesia assessment    Bilateral hearing loss    wears hearing aids   COVID-19 04/2022   Depression    Genital herpes    GERD (gastroesophageal reflux disease)    Herpes simplex virus infection    History of migraine    Murmur    Neuropathy of both feet 06/05/2015   OA (osteoarthritis) of hip    left   Osteopenia of lumbar spine 2010   Plantar fasciitis    Stomatitis    Wears hearing aid in both ears     Past Surgical History:  Procedure Laterality Date   CESAREAN SECTION     COLONOSCOPY  2010   COLONOSCOPY WITH PROPOFOL N/A 11/27/2018   Procedure: COLONOSCOPY WITH PROPOFOL;  Surgeon: Toney Reil, MD;  Location: [facility];  Service: Endoscopy;  Laterality: N/A;   ENDOSCOPIC CONCHA BULLOSA RESECTION Left 05/23/2018   Procedure: ENDOSCOPIC CONCHA BULLOSA RESECTION;  Surgeon: Vernie Murders, MD;  Location: [facility];  Service: ENT;  Laterality: Left;   FOOT SURGERY Right    REPLACEMENT TOTAL KNEE  3/13   Dr.Hallows at [facility]   SEPTOPLASTY Bilateral 05/23/2018   Procedure: SEPTOPLASTY;  Surgeon: Vernie Murders, MD;  Location: [facility];  Service: ENT;  Laterality: Bilateral;   TOTAL HIP ARTHROPLASTY Left 10/2008   TURBINATE REDUCTION Bilateral 05/23/2018   Procedure: INFERIOR TURBINATE REDUCTION;  Surgeon: Vernie Murders, MD;  Location: [facility];  Service: ENT;  Laterality: Bilateral;   UPPER GI ENDOSCOPY  1992    Prior to Admission medications   Medication Sig Start Date End Date Taking? Authorizing Provider  buPROPion (WELLBUTRIN XL) 150 MG 24 hr tablet TAKE  1 TABLET(150 MG) BY MOUTH TWICE DAILY 12/20/22  Yes Danelle Berry, PA-C  diphenhydrAMINE (BENADRYL) 25 MG tablet Take 25 mg by mouth every 6 (six) hours as needed.   Yes [provider]  meloxicam (MOBIC) 15 MG tablet Take 1 tablet (15 mg total) by mouth daily as needed for pain. 04/17/22  Yes Danelle Berry, PA-C  methocarbamol (ROBAXIN) 500 MG tablet Take 1 tablet (500 mg total) by mouth every 8 (eight) hours as needed for muscle spasms. 04/17/22  Yes Danelle Berry, PA-C  Misc. Devices (PULSE OXIMETER FOR FINGER) MISC 1 each by Does not apply route daily at 12 noon. 04/26/22  Yes Sowles, Danna Hefty, MD  pantoprazole (PROTONIX) 40 MG tablet TAKE 1 TABLET(40 MG) BY MOUTH TWICE DAILY 11/24/22  Yes Danelle Berry, PA-C  valACYclovir (VALTREX) 500 MG tablet TAKE 1 TABLET(500 MG) BY MOUTH DAILY 08/25/22  Yes Danelle Berry, PA-C  azelastine (ASTELIN) 0.1 % nasal spray Place into the nose. Patient not taking: Reported on 12/13/2022 02/12/22   [provider]  benzonatate (TESSALON) 100 MG capsule Take 1-2 capsules (100-200 mg total) by mouth 2 (two) times daily as needed for cough. Patient not taking: Reported on 10/13/2022 04/26/22   Alba Cory, MD  cholecalciferol (VITAMIN D3) 25 MCG (1000 UNIT) tablet Take 1,000 Units by mouth daily. Patient not taking: Reported on 12/13/2022    [provider]  famotidine (PEPCID) 20 MG tablet Take 1  tablet (20 mg total) by mouth 2 (two) times daily as needed for heartburn or indigestion. Patient not taking: Reported on 01/02/2023 04/17/22   Danelle Berry, PA-C  Multiple Vitamins-Minerals (HAIR SKIN AND NAILS FORMULA PO) Take by mouth. Patient not taking: Reported on 12/13/2022    [provider]  vitamin E 180 MG (400 UNITS) capsule Take 400 Units by mouth daily. Patient not taking: Reported on 12/13/2022    [provider]    Allergies as of 08/31/2022 - Review Complete 08/25/2022  Allergen Reaction Noted   Levofloxacin Hives    Morphine  Nausea Only 11/18/2015   Celecoxib Other (See Comments) 03/10/2015   Celexa [citalopram hydrobromide]  03/03/2015   Citrullus vulgaris Swelling 08/30/2021   Cymbalta [duloxetine hcl] Hives 03/03/2015   Gabapentin Other (See Comments) 06/11/2015   Levaquin [levofloxacin in d5w] Hives 03/03/2015   Valium [diazepam] Other (See Comments) 03/03/2015   Monistat [miconazole] Rash 03/03/2015   Prednisone Anxiety 03/03/2015    Family History  Problem Relation Age of Onset   Aneurysm Mother    Stroke Mother    Heart disease Mother    Heart disease Father 8   Hypertension Father    Lung disease Father    Heart disease Paternal Grandfather    Panic disorder Brother    Milk intolerance Daughter    Polycystic ovary syndrome Daughter    Obesity Brother    Hyperlipidemia Brother    Hypertension Brother    Diabetes Brother    Cancer Neg Hx    COPD Neg Hx    Breast cancer Neg Hx     Social History   Socioeconomic History   Marital status: Divorced    Spouse name: Not on file   Number of children: 1   Years of education: Not on file   Highest education level: Not on file  Occupational History   Not on file  Tobacco Use   Smoking status: Never   Smokeless tobacco: Never  Vaping Use   Vaping status: Never Used  Substance and Sexual Activity   Alcohol use: Yes    Comment: socially   Drug use: No   Sexual activity: Yes    Birth control/protection: Post-menopausal  Other Topics Concern   Not on file  Social History Narrative   Not on file   Social Determinants of Health   Financial Resource Strain: Low Risk  (10/13/2022)   Overall Financial Resource Strain (CARDIA)    Difficulty of Paying Living Expenses: Not hard at all  Food Insecurity: No Food Insecurity (10/13/2022)   Hunger Vital Sign    Worried About Running Out of Food in the Last Year: Never true    Ran Out of Food in the Last Year: Never true  Transportation Needs: No Transportation Needs (10/13/2022)   PRAPARE -  Administrator, Civil Service (Medical): No    Lack of Transportation (Non-Medical): No  Physical Activity: Insufficiently Active (10/13/2022)   Exercise Vital Sign    Days of Exercise per Week: 2 days    Minutes of Exercise per Session: 30 min  Stress: No Stress Concern Present (10/13/2022)   [employer] of Occupational Health - Occupational Stress Questionnaire    Feeling of Stress : Not at all  Social Connections: Moderately Isolated (10/13/2022)   Social Connection and Isolation Panel [NHANES]    Frequency of Communication with Friends and Family: More than three times a week    Frequency of Social Gatherings with Friends and  Family: More than three times a week    Attends Religious Services: Never    Active Member of Clubs or Organizations: Yes    Attends Banker Meetings: More than 4 times per year    Marital Status: Divorced  Intimate Partner Violence: Not At Risk (10/13/2022)   Humiliation, Afraid, Rape, and Kick questionnaire    Fear of Current or Ex-Partner: No    Emotionally Abused: No    Physically Abused: No    Sexually Abused: No    Review of Systems: See HPI, otherwise negative ROS  Physical Exam: BP (!) 164/103 Comment: Dr.Pelham aware  Pulse 73   Temp (!) 97.2 F (36.2 C) (Temporal)   Resp 17   Ht 5\' 4"  (1.626 m)   Wt 85.7 kg   SpO2 100%   BMI 32.42 kg/m  General:   Alert, cooperative in NAD Head:  Normocephalic and atraumatic. Respiratory:  Normal work of breathing. Cardiovascular:  RRR  Impression/Plan: Olivia Morgan is here for cataract surgery.  Risks, benefits, limitations, and alternatives regarding cataract surgery have been reviewed with the patient.  Questions have been answered.  All parties agreeable.   Galen Manila, MD  01/02/2023, 10:40 AM

## 2023-01-02 NOTE — Anesthesia Preprocedure Evaluation (Signed)
Anesthesia Evaluation  Patient identified by MRN, date of birth, ID band Patient awake    Reviewed: Allergy & Precautions, H&P , NPO status , Patient's Chart, lab work & pertinent test results  Airway Mallampati: IV  TM Distance: <3 FB Neck ROM: Full   Comment: Extremely short TMD, 1 FB TMD. Would expect difficult airway if intubation were needed, retrognathic Dental  (+) Caps   Pulmonary neg pulmonary ROS   Pulmonary exam normal breath sounds clear to auscultation       Cardiovascular negative cardio ROS Normal cardiovascular exam+ Valvular Problems/Murmurs  Rhythm:Regular Rate:Normal     Neuro/Psych  PSYCHIATRIC DISORDERS  Depression     Neuromuscular disease    GI/Hepatic Neg liver ROS,GERD  ,,  Endo/Other  negative endocrine ROS    Renal/GU Renal disease  negative genitourinary   Musculoskeletal  (+) Arthritis ,    Abdominal   Peds negative pediatric ROS (+)  Hematology negative hematology ROS (+)   Anesthesia Other Findings Herpes simplex virus infection  OA (osteoarthritis) of hip GERD (gastroesophageal reflux disease) Plantar fasciitis Depression  Stomatitis Bilateral hearing loss Murmur  Neuropathy of both feet Osteopenia of lumbar spine  Allergic reaction Acute pain of left shoulder  Wears hearing aid in both ears COVID-19  History of migraine    Reproductive/Obstetrics negative OB ROS                             Anesthesia Physical Anesthesia Plan  ASA: 2  Anesthesia Plan: MAC   Post-op Pain Management:    Induction: Intravenous  PONV Risk Score and Plan:   Airway Management Planned: Natural Airway and Nasal Cannula  Additional Equipment:   Intra-op Plan:   Post-operative Plan:   Informed Consent: I have reviewed the patients History and Physical, chart, labs and discussed the procedure including the risks, benefits and alternatives for the proposed  anesthesia with the patient or authorized representative who has indicated his/her understanding and acceptance.     Dental Advisory Given  Plan Discussed with: Anesthesiologist, CRNA and Surgeon  Anesthesia Plan Comments: (Patient consented for risks of anesthesia including but not limited to:  - adverse reactions to medications - damage to eyes, teeth, lips or other oral mucosa - nerve damage due to positioning  - sore throat or hoarseness - Damage to heart, brain, nerves, lungs, other parts of body or loss of life  Patient voiced understanding.)       Anesthesia Quick Evaluation

## 2023-01-02 NOTE — Transfer of Care (Signed)
Immediate Anesthesia Transfer of Care Note  Patient: Olivia Morgan  Procedure(s) Performed: CATARACT EXTRACTION PHACO AND INTRAOCULAR LENS PLACEMENT (IOC) RIGHT   CLAREON VIVITY TORIC LENS 6.11 00:33.5 (Right: Eye)  Patient Location: PACU  Anesthesia Type: MAC  Level of Consciousness: awake, alert  and patient cooperative  Airway and Oxygen Therapy: Patient Spontanous Breathing and Patient connected to supplemental oxygen  Post-op Assessment: Post-op Vital signs reviewed, Patient's Cardiovascular Status Stable, Respiratory Function Stable, Patent Airway and No signs of Nausea or vomiting  Post-op Vital Signs: Reviewed and stable  Complications: No notable events documented.

## 2023-01-02 NOTE — Op Note (Signed)
PREOPERATIVE DIAGNOSIS:  Nuclear sclerotic cataract of the right eye.   POSTOPERATIVE DIAGNOSIS:  Nuclear sclerotic cataract of the right eye.   OPERATIVE PROCEDURE:  Procedure(s): CATARACT EXTRACTION PHACO AND INTRAOCULAR LENS PLACEMENT (IOC) RIGHT   CLAREON VIVITY TORIC LENS 6.11 00:33.5   SURGEON:  Galen Manila, MD.   ANESTHESIA: 1.      Managed anesthesia care. 2.      Topical tetracaine drops followed by 2% Xylocaine jelly applied in the preoperative holding area.  Anesthesiologist: Marisue Humble, MD CRNA: Barbette Hair, CRNA   COMPLICATIONS:  None.   TECHNIQUE:   Stop and chop    DESCRIPTION OF PROCEDURE:  The patient was examined and consented in the preoperative holding area where the aforementioned topical anesthesia was applied to the right eye.  The patient was brought back to the Operating Room where he was sat upright on the gurney and given a target to fixate upon while the eye was marked at the 3:00 and 9:00 position.  The patient was then reclined on the operating table.  The eye was prepped and draped in the usual sterile ophthalmic fashion and a lid speculum was placed. A paracentesis was created with the side port blade and the anterior chamber was filled with viscoelastic. A near clear corneal incision was performed with the steel keratome. A continuous curvilinear capsulorrhexis was performed with a cystotome followed by the capsulorrhexis forceps. Hydrodissection and hydrodelineation were carried out with BSS on a blunt cannula. The lens was removed in a stop and chop technique and the remaining cortical material was removed with the irrigation-aspiration handpiece. The eye was inflated with viscoelastic and the ZCT lens  was placed in the eye and rotated to within a few degrees of the predetermined orientation.  The remaining viscoelastic was removed from the eye.  The Sinskey hook was used to rotate the toric lens into its final resting place at 078 degrees.  0.1 mL  of cefuroxime concentration 10 mg/mL was placed in the anterior chamber. The eye was inflated to a physiologic pressure and found to be watertight.  The eye was dressed with Combigan. The patient was given protective glasses to wear throughout the day and a shield with which to sleep tonight. The patient was also given drops with which to begin a drop regimen today and will follow-up with me in one day. Implant Name Type Inv. Item Serial No. Manufacturer Lot No. LRB No. Used Action  LENS CLAREON VIVITY 7.5 - [id number]  LENS CLAREON VIVITY 7.5 [id number] [employer]  Right 1 Implanted   Procedure(s): CATARACT EXTRACTION PHACO AND INTRAOCULAR LENS PLACEMENT (IOC) RIGHT   CLAREON VIVITY TORIC LENS 6.11 00:33.5 (Right)  Electronically signed: Galen Manila 01/02/2023 11:09 AM

## 2023-01-02 NOTE — Anesthesia Postprocedure Evaluation (Signed)
Anesthesia Post Note  Patient: Lekeshia Huestis Ogborn  Procedure(s) Performed: CATARACT EXTRACTION PHACO AND INTRAOCULAR LENS PLACEMENT (IOC) RIGHT   CLAREON VIVITY TORIC LENS 6.11 00:33.5 (Right: Eye)  Patient location during evaluation: PACU Anesthesia Type: MAC Level of consciousness: awake and alert Pain management: pain level controlled Vital Signs Assessment: post-procedure vital signs reviewed and stable Respiratory status: spontaneous breathing, nonlabored ventilation, respiratory function stable and patient connected to nasal cannula oxygen Cardiovascular status: stable and blood pressure returned to baseline Postop Assessment: no apparent nausea or vomiting Anesthetic complications: no   No notable events documented.   Last Vitals:  Vitals:   01/02/23 1115 01/02/23 1116  BP: (!) 141/70   Pulse: 76 74  Resp: 16 10  Temp:    SpO2: 97% 97%    Last Pain:  Vitals:   01/02/23 1115  TempSrc:   PainSc: 0-No pain                 Sherra Kimmons C Yessica Putnam

## 2023-01-03 ENCOUNTER — Encounter: Payer: Self-pay | Admitting: Ophthalmology

## 2023-01-03 DIAGNOSIS — H2512 Age-related nuclear cataract, left eye: Secondary | ICD-10-CM | POA: Diagnosis not present

## 2023-01-08 ENCOUNTER — Encounter: Payer: Self-pay | Admitting: Ophthalmology

## 2023-01-08 NOTE — Anesthesia Preprocedure Evaluation (Addendum)
Anesthesia Evaluation  Patient identified by MRN, date of birth, ID band Patient awake    Reviewed: Allergy & Precautions, H&P , NPO status , Patient's Chart, lab work & pertinent test results  Airway Mallampati: IV  TM Distance: <3 FB Neck ROM: Full   Comment: Comment: Extremely short TMD, 1 FB TMD. Would expect difficult airway if intubation were needed, retrognathic Dental no notable dental hx.    Pulmonary neg pulmonary ROS   Pulmonary exam normal breath sounds clear to auscultation       Cardiovascular negative cardio ROS Normal cardiovascular exam+ Valvular Problems/Murmurs  Rhythm:Regular Rate:Normal     Neuro/Psych  PSYCHIATRIC DISORDERS  Depression     Neuromuscular disease    GI/Hepatic Neg liver ROS,GERD  ,,  Endo/Other  negative endocrine ROS    Renal/GU Renal disease  negative genitourinary   Musculoskeletal  (+) Arthritis ,    Abdominal   Peds negative pediatric ROS (+)  Hematology negative hematology ROS (+)   Anesthesia Other Findings  OA (osteoarthritis) of hip GERD (gastroesophageal reflux disease)  Plantar fasciitis Depression Stomatitis  Bilateral hearing loss Murmur  Neuropathy of both feet Osteopenia of lumbar spine  Allergic reaction Acute pain of left shoulder  Wears hearing aid in both ears COVID-19  History of migraine At risk for difficult intubation on preoperative anesthesia assessment  Previous cataract surgery 01-02-23 with Dr. Juel Burrow as anesthesiologist    Reproductive/Obstetrics negative OB ROS                             Anesthesia Physical Anesthesia Plan  ASA: 2  Anesthesia Plan: MAC   Post-op Pain Management:    Induction: Intravenous  PONV Risk Score and Plan:   Airway Management Planned: Natural Airway and Nasal Cannula  Additional Equipment:   Intra-op Plan:   Post-operative Plan:   Informed Consent: I have reviewed the  patients History and Physical, chart, labs and discussed the procedure including the risks, benefits and alternatives for the proposed anesthesia with the patient or authorized representative who has indicated his/her understanding and acceptance.     Dental Advisory Given  Plan Discussed with: Anesthesiologist, CRNA and Surgeon  Anesthesia Plan Comments: (Patient consented for risks of anesthesia including but not limited to:  - adverse reactions to medications - damage to eyes, teeth, lips or other oral mucosa - nerve damage due to positioning  - sore throat or hoarseness - Damage to heart, brain, nerves, lungs, other parts of body or loss of life  Patient voiced understanding.)        Anesthesia Quick Evaluation

## 2023-01-15 NOTE — Discharge Instructions (Signed)

## 2023-01-16 ENCOUNTER — Encounter: Payer: Self-pay | Admitting: Ophthalmology

## 2023-01-16 ENCOUNTER — Ambulatory Visit: Payer: Medicare Other | Admitting: Anesthesiology

## 2023-01-16 ENCOUNTER — Ambulatory Visit
Admission: RE | Admit: 2023-01-16 | Discharge: 2023-01-16 | Disposition: A | Discharge status: Home / Self Care | Payer: Medicare Other | Attending: Ophthalmology | Admitting: Ophthalmology

## 2023-01-16 ENCOUNTER — Encounter
Admission: RE | Disposition: A | Discharge status: Home / Self Care | Payer: Self-pay | Source: Home / Self Care | Attending: Ophthalmology

## 2023-01-16 ENCOUNTER — Other Ambulatory Visit: Payer: Self-pay

## 2023-01-16 DIAGNOSIS — K219 Gastro-esophageal reflux disease without esophagitis: Secondary | ICD-10-CM | POA: Diagnosis not present

## 2023-01-16 DIAGNOSIS — F32A Depression, unspecified: Secondary | ICD-10-CM | POA: Diagnosis not present

## 2023-01-16 DIAGNOSIS — H2512 Age-related nuclear cataract, left eye: Secondary | ICD-10-CM | POA: Insufficient documentation

## 2023-01-16 HISTORY — PX: CATARACT EXTRACTION W/PHACO: SHX586

## 2023-01-16 SURGERY — PHACOEMULSIFICATION, CATARACT, WITH IOL INSERTION
Anesthesia: Monitor Anesthesia Care | Laterality: Left

## 2023-01-16 MED ORDER — SIGHTPATH DOSE#1 BSS IO SOLN
INTRAOCULAR | Status: DC | PRN
  Administered 2023-01-16: 2 mL

## 2023-01-16 MED ORDER — MIDAZOLAM HCL 2 MG/2ML IJ SOLN
INTRAMUSCULAR | Status: AC
  Filled 2023-01-16: qty 2

## 2023-01-16 MED ORDER — ARMC OPHTHALMIC DILATING DROPS
OPHTHALMIC | Status: AC
  Filled 2023-01-16: qty 0.5

## 2023-01-16 MED ORDER — BRIMONIDINE TARTRATE-TIMOLOL 0.2-0.5 % OP SOLN
OPHTHALMIC | Status: DC | PRN
  Administered 2023-01-16: 1 [drp] via OPHTHALMIC

## 2023-01-16 MED ORDER — SIGHTPATH DOSE#1 NA CHONDROIT SULF-NA HYALURON 40-17 MG/ML IO SOLN
INTRAOCULAR | Status: DC | PRN
  Administered 2023-01-16: 1 mL via INTRAOCULAR

## 2023-01-16 MED ORDER — SIGHTPATH DOSE#1 BSS IO SOLN
INTRAOCULAR | Status: DC | PRN
  Administered 2023-01-16: 31 mL via OPHTHALMIC

## 2023-01-16 MED ORDER — MIDAZOLAM HCL 2 MG/2ML IJ SOLN
INTRAMUSCULAR | Status: DC | PRN
  Administered 2023-01-16: 2 mg via INTRAVENOUS

## 2023-01-16 MED ORDER — FENTANYL CITRATE (PF) 100 MCG/2ML IJ SOLN
INTRAMUSCULAR | Status: AC
  Filled 2023-01-16: qty 2

## 2023-01-16 MED ORDER — TETRACAINE HCL 0.5 % OP SOLN
1.0000 [drp] | OPHTHALMIC | Status: DC | PRN
  Administered 2023-01-16 (×3): 1 [drp] via OPHTHALMIC

## 2023-01-16 MED ORDER — ARMC OPHTHALMIC DILATING DROPS
1.0000 | OPHTHALMIC | Status: DC | PRN
  Administered 2023-01-16 (×3): 1 via OPHTHALMIC

## 2023-01-16 MED ORDER — CEFUROXIME OPHTHALMIC INJECTION 1 MG/0.1 ML
INJECTION | OPHTHALMIC | Status: DC | PRN
Start: 2023-01-16 — End: 2023-01-16
  Administered 2023-01-16: 1 mg via INTRACAMERAL

## 2023-01-16 MED ORDER — FENTANYL CITRATE (PF) 100 MCG/2ML IJ SOLN
INTRAMUSCULAR | Status: DC | PRN
  Administered 2023-01-16: 100 ug via INTRAVENOUS

## 2023-01-16 MED ORDER — LACTATED RINGERS IV SOLN: INTRAVENOUS | Status: DC

## 2023-01-16 MED ORDER — SIGHTPATH DOSE#1 BSS IO SOLN
INTRAOCULAR | Status: DC | PRN
  Administered 2023-01-16: 15 mL via INTRAOCULAR

## 2023-01-16 MED ORDER — TETRACAINE HCL 0.5 % OP SOLN
OPHTHALMIC | Status: AC
  Filled 2023-01-16: qty 4

## 2023-01-16 SURGICAL SUPPLY — 11 items
ANGLE REVERSE CUT SHRT 25GA (CUTTER) ×1
CATARACT SUITE SIGHTPATH (MISCELLANEOUS) ×1
CYSTOTOME ANGL RVRS SHRT 25G (CUTTER) ×1 IMPLANT
FEE CATARACT SUITE SIGHTPATH (MISCELLANEOUS) ×1 IMPLANT
GLOVE BIOGEL PI IND STRL 8 (GLOVE) ×1 IMPLANT
GLOVE SURG LX STRL 8.0 MICRO (GLOVE) ×1 IMPLANT
LENS CLAREON VIVITY 7.5 ×1 IMPLANT
LENS IOL CLRN VT TRC 3 7.5 IMPLANT
NDL FILTER BLUNT 18X1 1/2 (NEEDLE) ×1 IMPLANT
NEEDLE FILTER BLUNT 18X1 1/2 (NEEDLE) ×1
SYR 3ML LL SCALE MARK (SYRINGE) ×1 IMPLANT

## 2023-01-16 NOTE — Anesthesia Postprocedure Evaluation (Signed)
Anesthesia Post Note  Patient: Olivia Morgan  Procedure(s) Performed: CATARACT EXTRACTION PHACO AND INTRAOCULAR LENS PLACEMENT (IOC) LEFT CLAREON VIVITY TORIC LENS 4.26 00:31.0 (Left)  Patient location during evaluation: PACU Anesthesia Type: MAC Level of consciousness: awake and alert Pain management: pain level controlled Vital Signs Assessment: post-procedure vital signs reviewed and stable Respiratory status: spontaneous breathing, nonlabored ventilation, respiratory function stable and patient connected to nasal cannula oxygen Cardiovascular status: stable and blood pressure returned to baseline Postop Assessment: no apparent nausea or vomiting Anesthetic complications: no   No notable events documented.   Last Vitals:  Vitals:   01/16/23 1259 01/16/23 1304  BP: 128/67 133/71  Pulse: 81 81  Resp: 15 16  Temp: (!) 36.3 C (!) 36.3 C  SpO2: 95% 97%    Last Pain:  Vitals:   01/16/23 1304  TempSrc:   PainSc: 0-No pain                 Japhet Morgenthaler C Joyice Magda

## 2023-01-16 NOTE — H&P (Signed)
[facility]   Primary Care Physician:  Danelle Berry, PA-C Ophthalmologist: Dr. Druscilla Brownie  Pre-Procedure History & Physical: HPI:  Olivia Morgan is a 67 y.o. female here for cataract surgery.   Past Medical History:  Diagnosis Date   Acute pain of left shoulder 06/08/2020   Allergic reaction 02/12/2017   At risk for difficult intubation on preoperative anesthesia assessment    Bilateral hearing loss    wears hearing aids   COVID-19 04/2022   Depression    Genital herpes    GERD (gastroesophageal reflux disease)    Herpes simplex virus infection    History of migraine    Murmur    Neuropathy of both feet 06/05/2015   OA (osteoarthritis) of hip    left   Osteopenia of lumbar spine 2010   Plantar fasciitis    Stomatitis    Wears hearing aid in both ears     Past Surgical History:  Procedure Laterality Date   CATARACT EXTRACTION W/PHACO Right 01/02/2023   Procedure: CATARACT EXTRACTION PHACO AND INTRAOCULAR LENS PLACEMENT (IOC) RIGHT   CLAREON VIVITY TORIC LENS 6.11 00:33.5;  Surgeon: Galen Manila, MD;  Location: [facility];  Service: Ophthalmology;  Laterality: Right;   CESAREAN SECTION     COLONOSCOPY  2010   COLONOSCOPY WITH PROPOFOL N/A 11/27/2018   Procedure: COLONOSCOPY WITH PROPOFOL;  Surgeon: Toney Reil, MD;  Location: [facility];  Service: Endoscopy;  Laterality: N/A;   ENDOSCOPIC CONCHA BULLOSA RESECTION Left 05/23/2018   Procedure: ENDOSCOPIC CONCHA BULLOSA RESECTION;  Surgeon: Vernie Murders, MD;  Location: [facility];  Service: ENT;  Laterality: Left;   FOOT SURGERY Right    REPLACEMENT TOTAL KNEE  3/13   Dr.Hallows at [facility]   SEPTOPLASTY Bilateral 05/23/2018   Procedure: SEPTOPLASTY;  Surgeon: Vernie Murders, MD;  Location: [facility];  Service: ENT;  Laterality: Bilateral;   TOTAL HIP ARTHROPLASTY Left 10/2008   TURBINATE REDUCTION Bilateral 05/23/2018   Procedure: INFERIOR TURBINATE REDUCTION;   Surgeon: Vernie Murders, MD;  Location: [facility];  Service: ENT;  Laterality: Bilateral;   UPPER GI ENDOSCOPY  1992    Prior to Admission medications   Medication Sig Start Date End Date Taking? Authorizing Provider  buPROPion (WELLBUTRIN XL) 150 MG 24 hr tablet TAKE 1 TABLET(150 MG) BY MOUTH TWICE DAILY 12/20/22  Yes Danelle Berry, PA-C  diphenhydrAMINE (BENADRYL) 25 MG tablet Take 25 mg by mouth every 6 (six) hours as needed.   Yes [provider]  meloxicam (MOBIC) 15 MG tablet Take 1 tablet (15 mg total) by mouth daily as needed for pain. 04/17/22  Yes Danelle Berry, PA-C  methocarbamol (ROBAXIN) 500 MG tablet Take 1 tablet (500 mg total) by mouth every 8 (eight) hours as needed for muscle spasms. 04/17/22  Yes Danelle Berry, PA-C  pantoprazole (PROTONIX) 40 MG tablet TAKE 1 TABLET(40 MG) BY MOUTH TWICE DAILY 11/24/22  Yes Danelle Berry, PA-C  valACYclovir (VALTREX) 500 MG tablet TAKE 1 TABLET(500 MG) BY MOUTH DAILY 08/25/22  Yes Danelle Berry, PA-C  azelastine (ASTELIN) 0.1 % nasal spray Place into the nose. Patient not taking: Reported on 12/13/2022 02/12/22   [provider]  benzonatate (TESSALON) 100 MG capsule Take 1-2 capsules (100-200 mg total) by mouth 2 (two) times daily as needed for cough. Patient not taking: Reported on 10/13/2022 04/26/22   Alba Cory, MD  cholecalciferol (VITAMIN D3) 25 MCG (1000 UNIT) tablet Take 1,000 Units by mouth daily. Patient not  taking: Reported on 12/13/2022    [provider]  famotidine (PEPCID) 20 MG tablet Take 1 tablet (20 mg total) by mouth 2 (two) times daily as needed for heartburn or indigestion. Patient not taking: Reported on 01/02/2023 04/17/22   Danelle Berry, PA-C  Misc. Devices (PULSE OXIMETER FOR FINGER) MISC 1 each by Does not apply route daily at 12 noon. 04/26/22   Alba Cory, MD  Multiple Vitamins-Minerals (HAIR SKIN AND NAILS FORMULA PO) Take by mouth. Patient not taking: Reported on 12/13/2022    [provider]  vitamin E 180 MG (400 UNITS) capsule Take 400 Units by mouth daily. Patient not taking: Reported on 12/13/2022    [provider]    Allergies as of 08/31/2022 - Review Complete 08/25/2022  Allergen Reaction Noted   Levofloxacin Hives    Morphine Nausea Only 11/18/2015   Celecoxib Other (See Comments) 03/10/2015   Celexa [citalopram hydrobromide]  03/03/2015   Citrullus vulgaris Swelling 08/30/2021   Cymbalta [duloxetine hcl] Hives 03/03/2015   Gabapentin Other (See Comments) 06/11/2015   Levaquin [levofloxacin in d5w] Hives 03/03/2015   Valium [diazepam] Other (See Comments) 03/03/2015   Monistat [miconazole] Rash 03/03/2015   Prednisone Anxiety 03/03/2015    Family History  Problem Relation Age of Onset   Aneurysm Mother    Stroke Mother    Heart disease Mother    Heart disease Father 15   Hypertension Father    Lung disease Father    Heart disease Paternal Grandfather    Panic disorder Brother    Milk intolerance Daughter    Polycystic ovary syndrome Daughter    Obesity Brother    Hyperlipidemia Brother    Hypertension Brother    Diabetes Brother    Cancer Neg Hx    COPD Neg Hx    Breast cancer Neg Hx     Social History   Socioeconomic History   Marital status: Divorced    Spouse name: Not on file   Number of children: 1   Years of education: Not on file   Highest education level: Not on file  Occupational History   Not on file  Tobacco Use   Smoking status: Never   Smokeless tobacco: Never  Vaping Use   Vaping status: Never Used  Substance and Sexual Activity   Alcohol use: Yes    Comment: socially   Drug use: No   Sexual activity: Yes    Birth control/protection: Post-menopausal  Other Topics Concern   Not on file  Social History Narrative   Not on file   Social Determinants of Health   Financial Resource Strain: Low Risk  (10/13/2022)   Overall Financial Resource Strain (CARDIA)    Difficulty of Paying Living Expenses:  Not hard at all  Food Insecurity: No Food Insecurity (10/13/2022)   Hunger Vital Sign    Worried About Running Out of Food in the Last Year: Never true    Ran Out of Food in the Last Year: Never true  Transportation Needs: No Transportation Needs (10/13/2022)   PRAPARE - Administrator, Civil Service (Medical): No    Lack of Transportation (Non-Medical): No  Physical Activity: Insufficiently Active (10/13/2022)   Exercise Vital Sign    Days of Exercise per Week: 2 days    Minutes of Exercise per Session: 30 min  Stress: No Stress Concern Present (10/13/2022)   [employer] of Occupational Health - Occupational Stress Questionnaire    Feeling of Stress :  Not at all  Social Connections: Moderately Isolated (10/13/2022)   Social Connection and Isolation Panel [NHANES]    Frequency of Communication with Friends and Family: More than three times a week    Frequency of Social Gatherings with Friends and Family: More than three times a week    Attends Religious Services: Never    Database administrator or Organizations: Yes    Attends Engineer, structural: More than 4 times per year    Marital Status: Divorced  Intimate Partner Violence: Not At Risk (10/13/2022)   Humiliation, Afraid, Rape, and Kick questionnaire    Fear of Current or Ex-Partner: No    Emotionally Abused: No    Physically Abused: No    Sexually Abused: No    Review of Systems: See HPI, otherwise negative ROS  Physical Exam: BP (!) 165/91   Pulse 75   Temp (!) 97.2 F (36.2 C) (Temporal)   Resp 18   Ht 5\' 4"  (1.626 m)   Wt 85.3 kg   SpO2 99%   BMI 32.27 kg/m  General:   Alert, cooperative in NAD Head:  Normocephalic and atraumatic. Respiratory:  Normal work of breathing. Cardiovascular:  RRR  Impression/Plan: Olivia Morgan is here for cataract surgery.  Risks, benefits, limitations, and alternatives regarding cataract surgery have been reviewed with the patient.  Questions have been  answered.  All parties agreeable.   Galen Manila, MD  01/16/2023, 12:34 PM

## 2023-01-16 NOTE — Op Note (Signed)
PREOPERATIVE DIAGNOSIS:  Nuclear sclerotic cataract of the left eye.   POSTOPERATIVE DIAGNOSIS:  Nuclear sclerotic cataract of the left eye.   OPERATIVE PROCEDURE: Procedure(s): CATARACT EXTRACTION PHACO AND INTRAOCULAR LENS PLACEMENT (IOC) LEFT CLAREON VIVITY TORIC LENS 4.26 00:31.0   SURGEON:  Galen Manila, MD.   ANESTHESIA: 1.      Managed anesthesia care. 2.     0.80ml os Shugarcaine was instilled following the paracentesis [email or web address]   COMPLICATIONS:  None.   TECHNIQUE:   Stop and chop    DESCRIPTION OF PROCEDURE:  The patient was examined and consented in the preoperative holding area where the aforementioned topical anesthesia was applied to the left eye.  The patient was brought back to the Operating Room where he was sat upright on the gurney and given a target to fixate upon while the eye was marked at the 3:00 and 9:00 position.  The patient was then reclined on the operating table.  The eye was prepped and draped in the usual sterile ophthalmic fashion and a lid speculum was placed. A paracentesis was created with the side port blade and the anterior chamber was filled with viscoelastic. A near clear corneal incision was performed with the steel keratome. A continuous curvilinear capsulorrhexis was performed with a cystotome followed by the capsulorrhexis forceps. Hydrodissection and hydrodelineation were carried out with BSS on a blunt cannula. The lens was removed in a stop and chop technique and the remaining cortical material was removed with the irrigation-aspiration handpiece. The eye was inflated with viscoelastic and the CNWET lens was placed in the eye and rotated to within a few degrees of the predetermined orientation.  The remaining viscoelastic was removed from the eye.  The Sinskey hook was used to rotate the toric lens into its final resting place at 100 degrees.  0.1 ml of Cefuroxime was placed in the anterior chamber. The eye was inflated to a physiologic pressure  and found to be watertight.  The eye was dressed with Combigan. The patient was given protective glasses to wear throughout the day and a shield with which to sleep tonight. The patient was also given drops with which to begin a drop regimen today and will follow-up with me in one day. Implant Name Type Inv. Item Serial No. Manufacturer Lot No. LRB No. Used Action  [id number] CLAREON VIVITY TORIC   [id number]   Left 1 Implanted   Procedure(s): CATARACT EXTRACTION PHACO AND INTRAOCULAR LENS PLACEMENT (IOC) LEFT CLAREON VIVITY TORIC LENS 4.26 00:31.0 (Left)  Electronically signed: Galen Manila 10/8/202412:57 PM

## 2023-01-16 NOTE — Transfer of Care (Signed)
Immediate Anesthesia Transfer of Care Note  Patient: Olivia Morgan  Procedure(s) Performed: CATARACT EXTRACTION PHACO AND INTRAOCULAR LENS PLACEMENT (IOC) LEFT CLAREON VIVITY TORIC LENS 4.26 00:31.0 (Left)  Patient Location: PACU  Anesthesia Type: MAC  Level of Consciousness: awake, alert  and patient cooperative  Airway and Oxygen Therapy: Patient Spontanous Breathing and Patient connected to supplemental oxygen  Post-op Assessment: Post-op Vital signs reviewed, Patient's Cardiovascular Status Stable, Respiratory Function Stable, Patent Airway and No signs of Nausea or vomiting  Post-op Vital Signs: Reviewed and stable  Complications: No notable events documented.

## 2023-01-17 ENCOUNTER — Encounter: Payer: Self-pay | Admitting: Ophthalmology

## 2023-01-18 DIAGNOSIS — K080 Exfoliation of teeth due to systemic causes: Secondary | ICD-10-CM | POA: Diagnosis not present

## 2023-01-26 IMAGING — CR DG SHOULDER 2+V*L*
1 series · 3 of 3 positions shown · non-contrast
Comparison: None.

CLINICAL DATA: Pain in the shoulder joint radiating to the mid left
humerus since March 2020

EXAM:
LEFT SHOULDER - 2+ VIEW

[Series 1: dg shoulder left · 0.14mm/px · 3 of 3 slices shown]
[im 1/3]
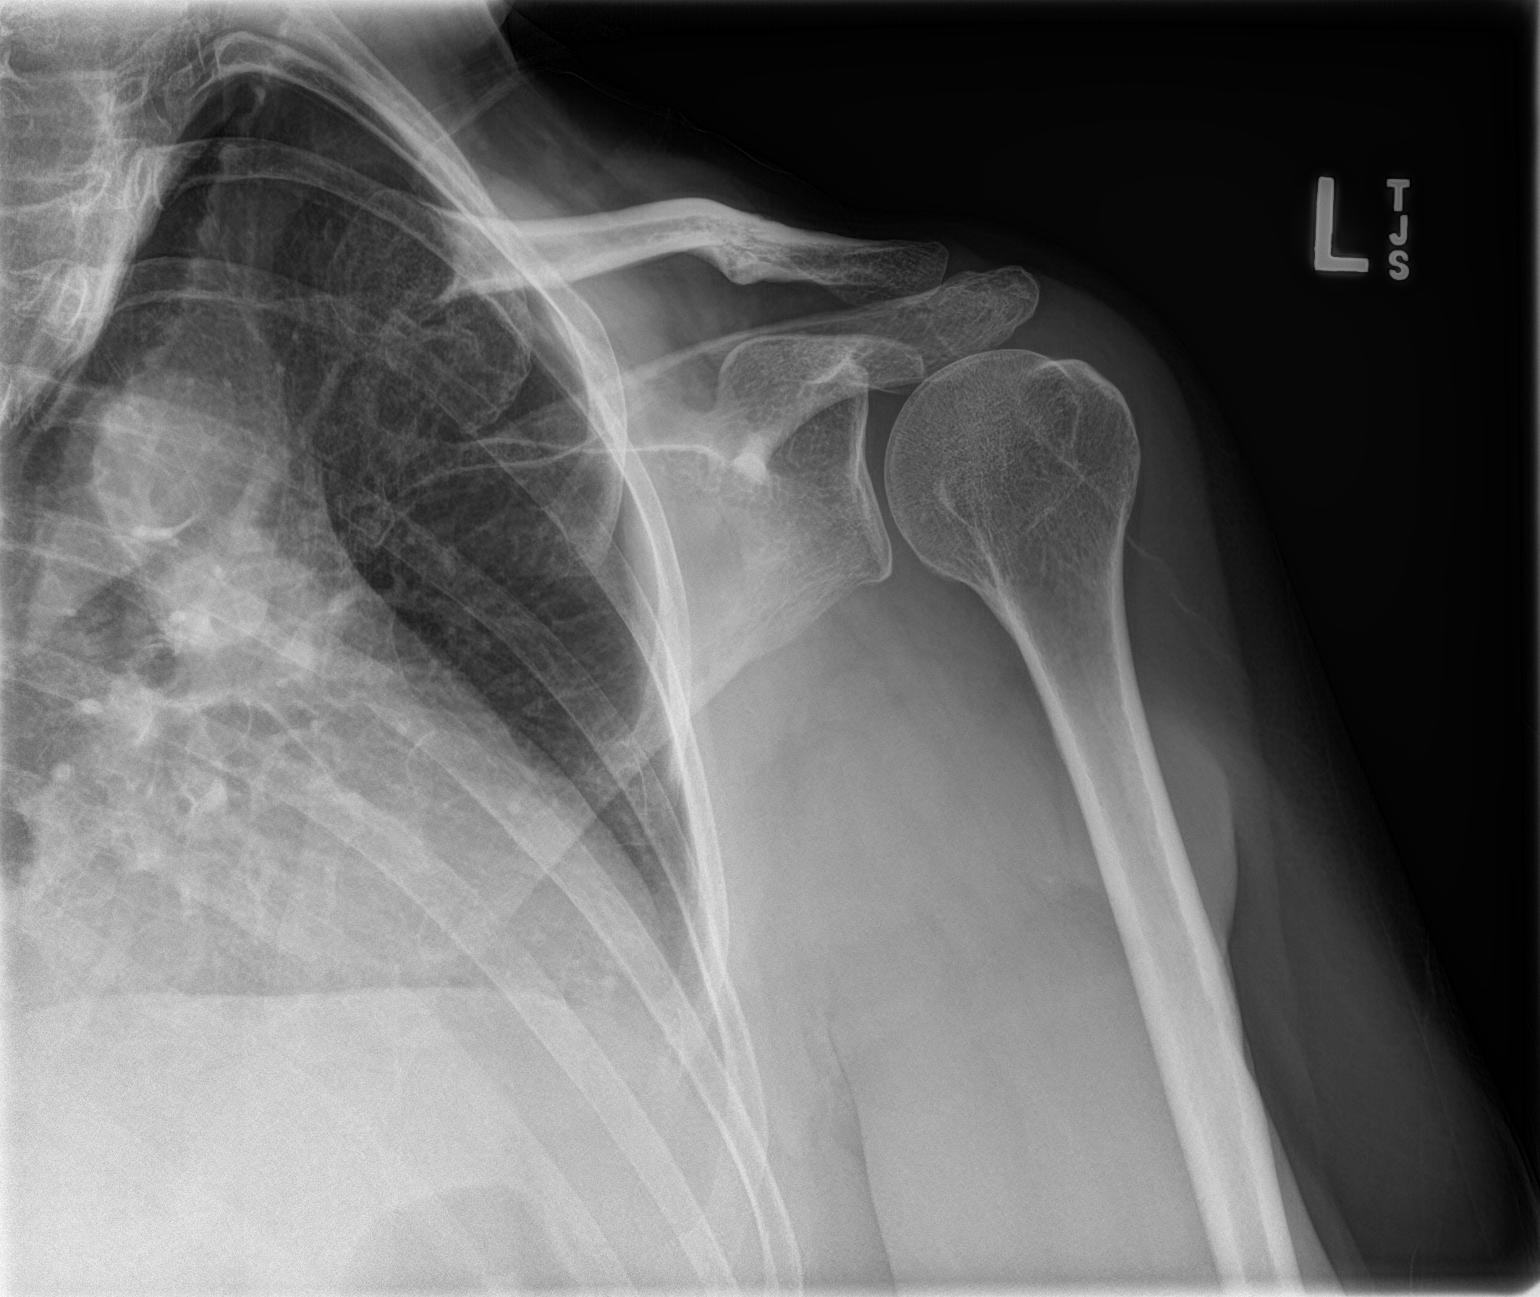
[im 2/3]
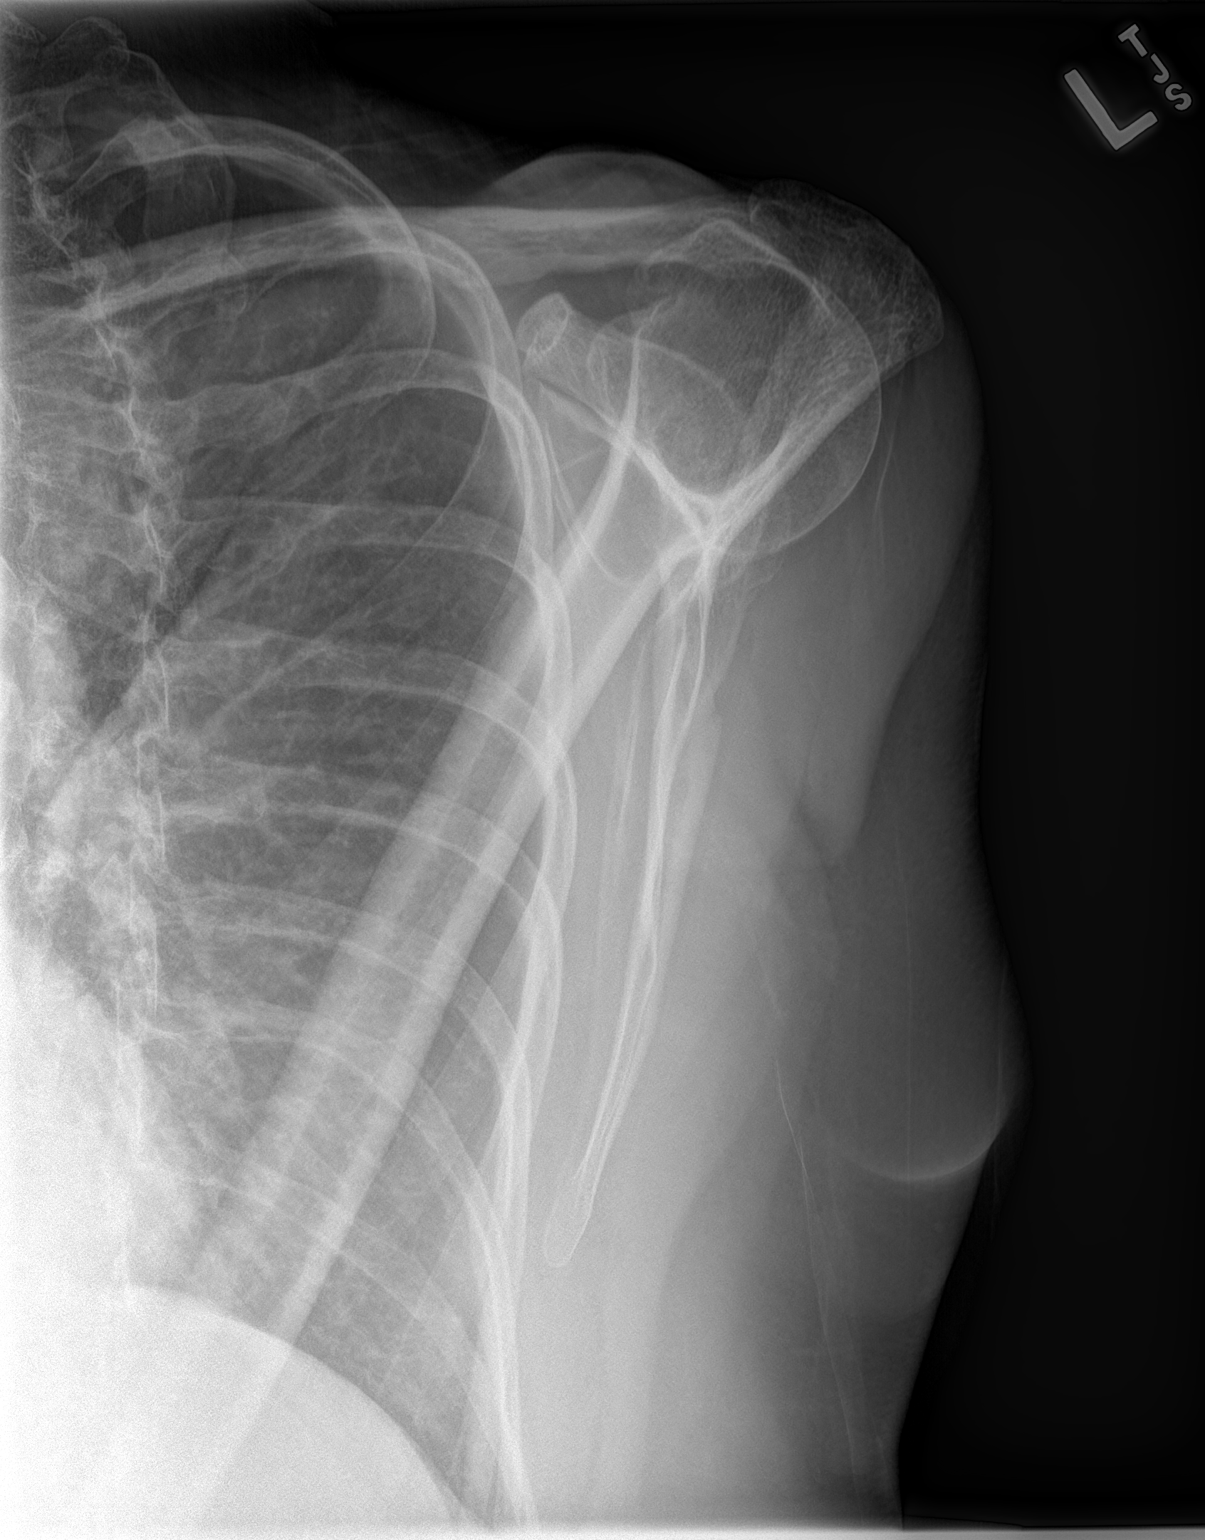
[im 3/3]
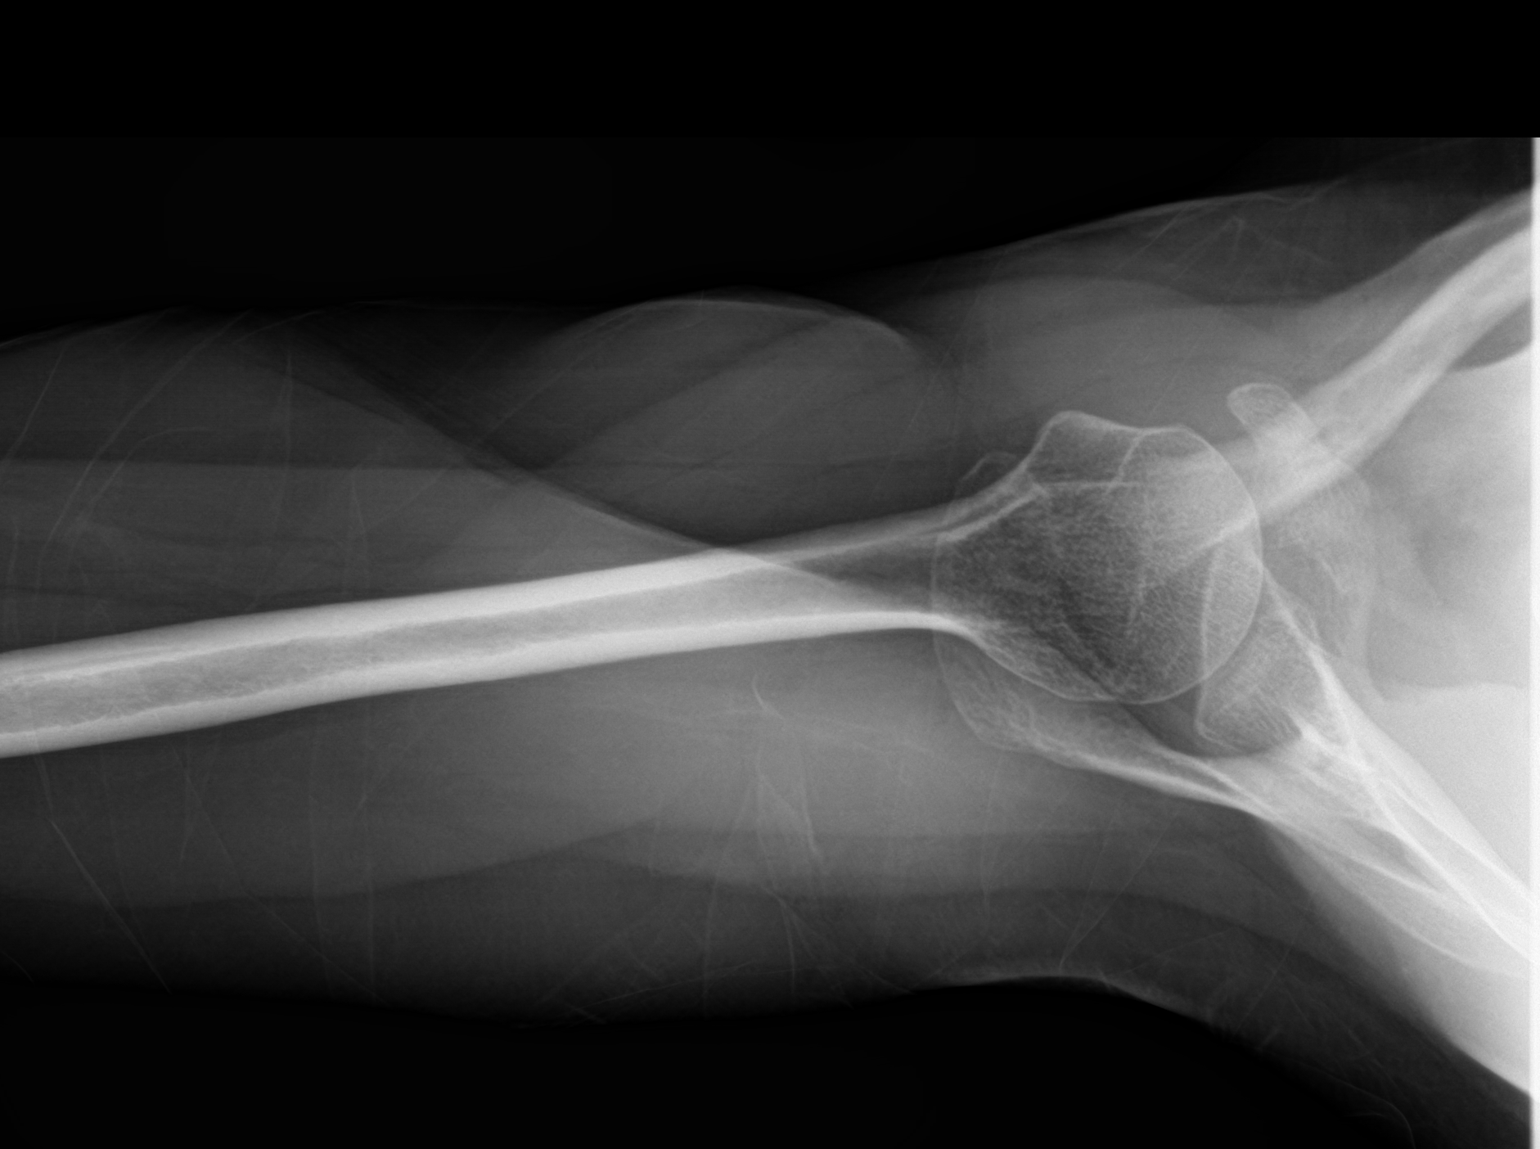

[3 of 3 positions shown; findings below may reference images not displayed]

FINDINGS: There is no evidence of fracture or dislocation. There is no
evidence of arthropathy or other focal bone abnormality. Soft
tissues are unremarkable.
IMPRESSION: Negative.

## 2023-02-09 ENCOUNTER — Other Ambulatory Visit: Payer: Self-pay | Admitting: Family Medicine

## 2023-02-09 DIAGNOSIS — K2100 Gastro-esophageal reflux disease with esophagitis, without bleeding: Secondary | ICD-10-CM

## 2023-02-09 DIAGNOSIS — K449 Diaphragmatic hernia without obstruction or gangrene: Secondary | ICD-10-CM

## 2023-02-20 DIAGNOSIS — K080 Exfoliation of teeth due to systemic causes: Secondary | ICD-10-CM | POA: Diagnosis not present

## 2023-02-26 ENCOUNTER — Encounter: Payer: Medicare Other | Admitting: Family Medicine

## 2023-03-06 NOTE — Progress Notes (Signed)
Name: Olivia Morgan   MRN: [id number]    DOB: Jun 11, 1955   Date:03/16/2023       Progress Note  Subjective  Chief Complaint  Chief Complaint  Patient presents with   Annual Exam    HPI  Patient presents for annual CPE. Patient would also like to discuss ongoing acid reflux symptoms. She is currently on Protonix 40 mg daily but still endorses acid reflux symptoms on a nightly basis.  She states her symptoms are so bad that she sometimes feels like she is aspirating acid from her esophagus and her lungs and will cough and choke.  She did have an EGD in 2016 which was consistent with a small hiatal hernia but has not seen GI since that time.  Diet: Regular Exercise: 3 days 30 minutes  Last Eye Exam: 02/08/2023 Last Dental Exam: 03/15/2023  Flowsheet Row Office Visit from 03/16/2023 in [facility]  AUDIT-C Score 1      Depression: Phq 9 is  positive    03/16/2023    1:46 PM 10/13/2022    1:12 PM 08/25/2022    8:43 AM 04/26/2022    4:17 PM 04/17/2022   11:31 AM  Depression screen PHQ 2/9  Decreased Interest 1 0 0 0 0  Down, Depressed, Hopeless 1 0 0 0 0  PHQ - 2 Score 2 0 0 0 0  Altered sleeping 1  0 0 0  Tired, decreased energy 0  0 0 0  Change in appetite 0  0 0 0  Feeling bad or failure about yourself  1  0 0 0  Trouble concentrating 0  0 0 0  Moving slowly or fidgety/restless 0  0 0 0  Suicidal thoughts 0  0 0 0  PHQ-9 Score 4  0 0 0  Difficult doing work/chores Not difficult at all Not difficult at all Not difficult at all  Not difficult at all   Hypertension: BP Readings from Last 3 Encounters:  03/16/23 132/84  01/16/23 133/71  01/02/23 (!) 141/70   Obesity: Wt Readings from Last 3 Encounters:  03/16/23 190 lb 8 oz (86.4 kg)  01/16/23 188 lb (85.3 kg)  01/02/23 188 lb 14.4 oz (85.7 kg)   BMI Readings from Last 3 Encounters:  03/16/23 32.70 kg/m  01/16/23 32.27 kg/m  01/02/23 32.42 kg/m     Vaccines:   HPV: N/a Tdap: due,  discussed getting at pharmacy  Shingrix: complete Pneumonia: complete Flu: declined COVID-19:2 doses  Hep C Screening: complete STD testing and prevention (HIV/chl/gon/syphilis): no concerns  Intimate partner violence: negative screen  LMP: age 54, no issues with vaginal bleeding  Discussed importance of follow up if any post-menopausal bleeding: no  Incontinence Symptoms: negative for symptoms   Breast cancer:  - Last Mammogram: 03/29/22 ordered  Osteoporosis Prevention : Discussed high calcium and vitamin D supplementation, weight bearing exercises Bone density :no 10/04/21, normal   Cervical cancer screening: screening complete  Skin cancer: Discussed monitoring for atypical lesions  Colorectal cancer: 11/27/18 colonoscopy, repeat in 10 years Lung cancer:  Low Dose CT Chest recommended if Age 7-80 years, 20 pack-year currently smoking OR have quit w/in 15years. Patient does not qualify for screen     Advanced Care Planning: A voluntary discussion about advance care planning including the explanation and discussion of advance directives.  Discussed health care proxy and Living will, and the patient was able to identify a health care proxy as Latanya Presser, Cindee Lame.  Patient  does not have a living will and power of attorney of health care   Lipids: Lab Results  Component Value Date   CHOL 170 03/01/2022   CHOL 171 03/02/2021   CHOL 187 01/09/2020   Lab Results  Component Value Date   HDL 74 03/01/2022   HDL 67 03/02/2021   HDL 72 01/09/2020   Lab Results  Component Value Date   LDLCALC 82 03/01/2022   LDLCALC 87 03/02/2021   LDLCALC 98 01/09/2020   Lab Results  Component Value Date   TRIG 73 03/01/2022   TRIG 91 03/02/2021   TRIG 95 01/09/2020   Lab Results  Component Value Date   CHOLHDL 2.3 03/01/2022   CHOLHDL 2.6 03/02/2021   CHOLHDL 2.6 01/09/2020   No results found for: "LDLDIRECT"  Glucose: Glucose  Date Value Ref Range Status  03/01/2022  98 70 - 99 mg/dL Final  40/98/1191 79 65 - 99 mg/dL Final  47/82/9562 95 65 - 99 mg/dL Final    Patient Active Problem List   Diagnosis Date Noted   Prediabetes 08/11/2021   Rectal bleeding    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   History of total hip replacement 03/05/2017   Dermatitis 02/12/2017   Allergic rhinitis 04/16/2016   Presence of right artificial knee joint 12/16/2015   Class 1 obesity with body mass index (BMI) of 32.0 to 32.9 in adult 11/18/2015   Persistent depressive disorder 11/18/2015   Vitamin D deficiency 11/11/2015   Mass of left lower leg 09/07/2015   Adverse drug reaction 06/13/2015   Greater trochanteric bursitis of left hip 06/05/2015   Hammer toe of right foot 06/05/2015   Neuropathy of both feet 06/05/2015   Chronic kidney disease, 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/05/2015   Lower back pain 03/10/2015   OA (osteoarthritis) of hip    GERD (gastroesophageal reflux disease)    Plantar fasciitis    Depression    Genital herpes    Bilateral hearing loss    Abnormal ECG 09/09/2014   Hip pain 09/09/2012   Other mechanical complication of other internal orthopedic device, implant, and graft 09/09/2012   Medial collateral ligament sprain of knee 11/13/2011   Other and unspecified derangement of medial meniscus 06/13/2011   Primary localized osteoarthrosis, lower leg 06/13/2011    Past Surgical History:  Procedure Laterality Date   CATARACT EXTRACTION W/PHACO Right 01/02/2023   Procedure: CATARACT EXTRACTION PHACO AND INTRAOCULAR LENS PLACEMENT (IOC) RIGHT   CLAREON VIVITY TORIC LENS 6.11 00:33.5;  Surgeon: Galen Manila, MD;  Location: [facility];  Service: Ophthalmology;  Laterality: Right;   CATARACT EXTRACTION W/PHACO Left 01/16/2023   Procedure: CATARACT EXTRACTION PHACO AND INTRAOCULAR LENS PLACEMENT (IOC) LEFT CLAREON VIVITY TORIC LENS 4.26 00:31.0;  Surgeon: Galen Manila, MD;  Location: [facility];   Service: Ophthalmology;  Laterality: Left;   CESAREAN SECTION     COLONOSCOPY  2010   COLONOSCOPY WITH PROPOFOL N/A 11/27/2018   Procedure: COLONOSCOPY WITH PROPOFOL;  Surgeon: Toney Reil, MD;  Location: [facility];  Service: Endoscopy;  Laterality: N/A;   ENDOSCOPIC CONCHA BULLOSA RESECTION Left 05/23/2018   Procedure: ENDOSCOPIC CONCHA BULLOSA RESECTION;  Surgeon: Vernie Murders, MD;  Location: [facility];  Service: ENT;  Laterality: Left;   FOOT SURGERY Right    REPLACEMENT TOTAL KNEE  3/13   Dr[facility]Hallows at [facility]   SEPTOPLASTY Bilateral 05/23/2018   Procedure: SEPTOPLASTY;  Surgeon: Vernie Murders, MD;  Location: [facility];  Service: ENT;  Laterality: Bilateral;   TOTAL HIP ARTHROPLASTY Left 10/2008   TURBINATE REDUCTION Bilateral 05/23/2018   Procedure: INFERIOR TURBINATE REDUCTION;  Surgeon: Vernie Murders, MD;  Location: [facility];  Service: ENT;  Laterality: Bilateral;   UPPER GI ENDOSCOPY  1992    Family History  Problem Relation Age of Onset   Aneurysm Mother    Stroke Mother    Heart disease Mother    Heart disease Father 61   Hypertension Father    Lung disease Father    Heart disease Paternal Grandfather    Panic disorder Brother    Milk intolerance Daughter    Polycystic ovary syndrome Daughter    Obesity Brother    Hyperlipidemia Brother    Hypertension Brother    Diabetes Brother    Cancer Neg Hx    COPD Neg Hx    Breast cancer Neg Hx     Social History   Socioeconomic History   Marital status: Divorced    Spouse name: Not on file   Number of children: 1   Years of education: Not on file   Highest education level: Not on file  Occupational History   Not on file  Tobacco Use   Smoking status: Never   Smokeless tobacco: Never  Vaping Use   Vaping status: Never Used  Substance and Sexual Activity   Alcohol use: Yes    Comment: socially   Drug use: No   Sexual activity: Yes    Birth  control/protection: Post-menopausal  Other Topics Concern   Not on file  Social History Narrative   Not on file   Social Determinants of Health   Financial Resource Strain: Low Risk  (03/16/2023)   Overall Financial Resource Strain (CARDIA)    Difficulty of Paying Living Expenses: Not hard at all  Food Insecurity: No Food Insecurity (03/16/2023)   Hunger Vital Sign    Worried About Running Out of Food in the Last Year: Never true    Ran Out of Food in the Last Year: Never true  Transportation Needs: No Transportation Needs (03/16/2023)   PRAPARE - Administrator, Civil Service (Medical): No    Lack of Transportation (Non-Medical): No  Physical Activity: Insufficiently Active (03/16/2023)   Exercise Vital Sign    Days of Exercise per Week: 3 days    Minutes of Exercise per Session: 30 min  Stress: Stress Concern Present (03/16/2023)   [employer] of Occupational Health - Occupational Stress Questionnaire    Feeling of Stress : To some extent  Social Connections: Moderately Isolated (03/16/2023)   Social Connection and Isolation Panel [NHANES]    Frequency of Communication with Friends and Family: More than three times a week    Frequency of Social Gatherings with Friends and Family: More than three times a week    Attends Religious Services: 1 to 4 times per year    Active Member of [employer] or Organizations: No    Attends Banker Meetings: Never    Marital Status: Divorced  Catering manager Violence: Not At Risk (03/16/2023)   Humiliation, Afraid, Rape, and Kick questionnaire    Fear of Current or Ex-Partner: No    Emotionally Abused: No    Physically Abused: No    Sexually Abused: No     Current Outpatient Medications:    buPROPion (WELLBUTRIN XL) 150 MG 24 hr tablet, TAKE 1 TABLET(150 MG) BY MOUTH TWICE DAILY, Disp: 180 tablet, Rfl: 0  diphenhydrAMINE (BENADRYL) 25 MG tablet, Take 25 mg by mouth every 6 (six) hours as needed., Disp: , Rfl:     meloxicam (MOBIC) 15 MG tablet, Take 1 tablet (15 mg total) by mouth daily as needed for pain., Disp: 90 tablet, Rfl: 3   pantoprazole (PROTONIX) 40 MG tablet, TAKE 1 TABLET(40 MG) BY MOUTH TWICE DAILY, Disp: 60 tablet, Rfl: 2   valACYclovir (VALTREX) 500 MG tablet, TAKE 1 TABLET(500 MG) BY MOUTH DAILY, Disp: 90 tablet, Rfl: 2   azelastine (ASTELIN) 0.1 % nasal spray, Place into the nose. (Patient not taking: Reported on 12/13/2022), Disp: , Rfl:    benzonatate (TESSALON) 100 MG capsule, Take 1-2 capsules (100-200 mg total) by mouth 2 (two) times daily as needed for cough. (Patient not taking: Reported on 10/13/2022), Disp: 30 capsule, Rfl: 1   cholecalciferol (VITAMIN D3) 25 MCG (1000 UNIT) tablet, Take 1,000 Units by mouth daily. (Patient not taking: Reported on 12/13/2022), Disp: , Rfl:    famotidine (PEPCID) 20 MG tablet, Take 1 tablet (20 mg total) by mouth 2 (two) times daily as needed for heartburn or indigestion. (Patient not taking: Reported on 01/02/2023), Disp: 60 tablet, Rfl: 1   methocarbamol (ROBAXIN) 500 MG tablet, Take 1 tablet (500 mg total) by mouth every 8 (eight) hours as needed for muscle spasms. (Patient not taking: Reported on 03/16/2023), Disp: 60 tablet, Rfl: 1   Misc. Devices (PULSE OXIMETER FOR FINGER) MISC, 1 each by Does not apply route daily at 12 noon. (Patient not taking: Reported on 03/16/2023), Disp: 1 each, Rfl: 0   Multiple Vitamins-Minerals (HAIR SKIN AND NAILS FORMULA PO), Take by mouth. (Patient not taking: Reported on 12/13/2022), Disp: , Rfl:    vitamin E 180 MG (400 UNITS) capsule, Take 400 Units by mouth daily. (Patient not taking: Reported on 12/13/2022), Disp: , Rfl:   Allergies  Allergen Reactions   Levofloxacin Hives   Morphine Nausea Only   Celecoxib Other (See Comments)    SOB   Celexa [Citalopram Hydrobromide]    Citrullus Vulgaris Swelling    Per pt says causes throat to swell   Cymbalta [Duloxetine Hcl] Hives   Gabapentin Other (See Comments)    anger    Levaquin [Levofloxacin In D5w] Hives   Valium [Diazepam] Other (See Comments)    hallucinations   Monistat [Miconazole] Rash   Prednisone Anxiety     Review of Systems  Gastrointestinal:  Positive for heartburn and nausea.    Objective  Vitals:   03/16/23 1341  BP: 132/84  Pulse: 94  Resp: 14  Temp: 97.8 F (36.6 C)  TempSrc: Oral  SpO2: 98%  Weight: 190 lb 8 oz (86.4 kg)  Height: 5\' 4"  (1.626 m)    Body mass index is 32.7 kg/m.  Physical Exam Constitutional:      Appearance: Normal appearance.  HENT:     Head: Normocephalic and atraumatic.  Eyes:     Conjunctiva/sclera: Conjunctivae normal.  Cardiovascular:     Rate and Rhythm: Normal rate and regular rhythm.  Pulmonary:     Effort: Pulmonary effort is normal.     Breath sounds: Normal breath sounds.  Musculoskeletal:     Right lower leg: No edema.     Left lower leg: No edema.  Skin:    General: Skin is warm and dry.  Neurological:     General: No focal deficit present.     Mental Status: She is alert. Mental status is at baseline.  Psychiatric:  Mood and Affect: Mood normal.        Behavior: Behavior normal.     No results found for this or any previous visit (from the past 2160 hour(s)).   Fall Risk:    03/16/2023    1:45 PM 10/13/2022    1:05 PM 08/25/2022    8:43 AM 04/26/2022    4:17 PM 04/17/2022   11:31 AM  Fall Risk   Falls in the past year? 0 1 1 1 1   Number falls in past yr: 0 0 0 0 0  Injury with Fall? 0 1 1 0 0  Risk for fall due to :  No Fall Risks Impaired balance/gait History of fall(s) Impaired balance/gait  Follow up  Education provided;Falls prevention discussed Falls prevention discussed;Education provided;Falls evaluation completed Falls prevention discussed;Education provided Falls prevention discussed;Education provided;Falls evaluation completed    Functional Status Survey: Is the patient deaf or have difficulty hearing?: Yes Does the patient have difficulty seeing,  even when wearing glasses/contacts?: No Does the patient have difficulty concentrating, remembering, or making decisions?: No Does the patient have difficulty walking or climbing stairs?: No Does the patient have difficulty dressing or bathing?: No Does the patient have difficulty doing errands alone such as visiting a doctor's office or shopping?: No  Assessment & Plan  1. Annual physical exam/Prediabetes/Lipid screening: Physical exam completed, health maintenance reviewed and annual labs ordered.   - CBC w/Diff/Platelet - Lipid Profile - HgB A1c - Comprehensive Metabolic Panel (CMET)  2. Gastroesophageal reflux disease with esophagitis without hemorrhage: Patient endorsing breakthrough acid reflux symptoms despite being on PPI therapy, will place referral to GI for repeat EGD.  - Ambulatory referral to Gastroenterology  3. Encounter for screening mammogram for malignant neoplasm of breast: Mammogram ordered.  - MM 3D SCREENING MAMMOGRAM BILATERAL BREAST; Future  4. Vaccine for streptococcus pneumoniae and influenza: Prevnar 20 administered today.   - Pneumococcal conjugate vaccine 20-valent (Prevnar 20)   -USPSTF grade A and B recommendations reviewed with patient; age-appropriate recommendations, preventive care, screening tests, etc discussed and encouraged; healthy living encouraged; see AVS for patient education given to patient -Discussed importance of 150 minutes of physical activity weekly, eat two servings of fish weekly, eat one serving of tree nuts ( cashews, pistachios, pecans, almonds.Marland Kitchen) every other day, eat 6 servings of fruit/vegetables daily and drink plenty of water and avoid sweet beverages.   -Reviewed Health Maintenance: Yes.

## 2023-03-16 ENCOUNTER — Encounter: Payer: Self-pay | Admitting: Internal Medicine

## 2023-03-16 ENCOUNTER — Other Ambulatory Visit: Payer: Self-pay | Admitting: Family Medicine

## 2023-03-16 ENCOUNTER — Ambulatory Visit: Payer: Medicare Other | Admitting: Internal Medicine

## 2023-03-16 ENCOUNTER — Other Ambulatory Visit: Payer: Self-pay

## 2023-03-16 VITALS — BP 132/84 | HR 94 | Temp 97.8°F | Resp 14 | Ht 64.0 in | Wt 190.5 lb

## 2023-03-16 DIAGNOSIS — Z13220 Encounter for screening for lipoid disorders: Secondary | ICD-10-CM

## 2023-03-16 DIAGNOSIS — K2100 Gastro-esophageal reflux disease with esophagitis, without bleeding: Secondary | ICD-10-CM

## 2023-03-16 DIAGNOSIS — Z0000 Encounter for general adult medical examination without abnormal findings: Secondary | ICD-10-CM

## 2023-03-16 DIAGNOSIS — Z23 Encounter for immunization: Secondary | ICD-10-CM | POA: Diagnosis not present

## 2023-03-16 DIAGNOSIS — Z1231 Encounter for screening mammogram for malignant neoplasm of breast: Secondary | ICD-10-CM

## 2023-03-16 DIAGNOSIS — R7303 Prediabetes: Secondary | ICD-10-CM | POA: Diagnosis not present

## 2023-03-16 DIAGNOSIS — Z0001 Encounter for general adult medical examination with abnormal findings: Secondary | ICD-10-CM | POA: Diagnosis not present

## 2023-03-16 DIAGNOSIS — F320 Major depressive disorder, single episode, mild: Secondary | ICD-10-CM

## 2023-03-22 DIAGNOSIS — M11261 Other chondrocalcinosis, right knee: Secondary | ICD-10-CM | POA: Diagnosis not present

## 2023-03-22 DIAGNOSIS — M25572 Pain in left ankle and joints of left foot: Secondary | ICD-10-CM | POA: Diagnosis not present

## 2023-03-22 DIAGNOSIS — Z96653 Presence of artificial knee joint, bilateral: Secondary | ICD-10-CM | POA: Diagnosis not present

## 2023-03-22 DIAGNOSIS — Z881 Allergy status to other antibiotic agents status: Secondary | ICD-10-CM | POA: Diagnosis not present

## 2023-03-22 DIAGNOSIS — Z888 Allergy status to other drugs, medicaments and biological substances status: Secondary | ICD-10-CM | POA: Diagnosis not present

## 2023-03-22 DIAGNOSIS — Z885 Allergy status to narcotic agent status: Secondary | ICD-10-CM | POA: Diagnosis not present

## 2023-03-22 DIAGNOSIS — M1711 Unilateral primary osteoarthritis, right knee: Secondary | ICD-10-CM | POA: Diagnosis not present

## 2023-03-22 DIAGNOSIS — Z043 Encounter for examination and observation following other accident: Secondary | ICD-10-CM | POA: Diagnosis not present

## 2023-03-22 DIAGNOSIS — W19XXXA Unspecified fall, initial encounter: Secondary | ICD-10-CM | POA: Diagnosis not present

## 2023-03-22 DIAGNOSIS — M25561 Pain in right knee: Secondary | ICD-10-CM | POA: Diagnosis not present

## 2023-03-22 DIAGNOSIS — M7989 Other specified soft tissue disorders: Secondary | ICD-10-CM | POA: Diagnosis not present

## 2023-03-22 DIAGNOSIS — Z91018 Allergy to other foods: Secondary | ICD-10-CM | POA: Diagnosis not present

## 2023-03-22 DIAGNOSIS — S99912A Unspecified injury of left ankle, initial encounter: Secondary | ICD-10-CM | POA: Diagnosis not present

## 2023-03-22 DIAGNOSIS — M25461 Effusion, right knee: Secondary | ICD-10-CM | POA: Diagnosis not present

## 2023-03-22 DIAGNOSIS — M25562 Pain in left knee: Secondary | ICD-10-CM | POA: Diagnosis not present

## 2023-03-26 ENCOUNTER — Ambulatory Visit: Payer: Medicare Other | Admitting: Physician Assistant

## 2023-03-26 VITALS — BP 118/82 | HR 98 | Temp 98.0°F | Resp 16 | Ht 64.0 in | Wt 190.0 lb

## 2023-03-26 DIAGNOSIS — R0989 Other specified symptoms and signs involving the circulatory and respiratory systems: Secondary | ICD-10-CM

## 2023-03-26 LAB — POCT INFLUENZA A/B
Influenza A, POC: NEGATIVE
Influenza B, POC: NEGATIVE

## 2023-03-26 MED ORDER — METHYLPREDNISOLONE 4 MG PO TBPK
ORAL_TABLET | ORAL | 0 refills | Status: DC
Start: 2023-03-26 — End: 2023-04-06

## 2023-03-26 MED ORDER — AZITHROMYCIN 250 MG PO TABS
ORAL_TABLET | ORAL | 0 refills | Status: DC
Start: 2023-03-26 — End: 2023-04-06

## 2023-03-26 NOTE — Progress Notes (Signed)
Acute Office Visit   Patient: Olivia Morgan   DOB: 1955/06/12   67 y.o. Female  MRN: [id number] Visit Date: 03/26/2023  Today's healthcare provider: Oswaldo Conroy Rubylee Zamarripa, PA-C  Introduced myself to the patient as a Secondary school teacher and provided education on APPs in clinical practice.    Chief Complaint  Patient presents with   Headache    Constant, x5 days   Cough    A little productive- having nasal drainage.   Fatigue    x5 days   Subjective    HPI HPI     Headache    Additional comments: Constant, x5 days        Cough    Additional comments: A little productive- having nasal drainage.        Fatigue    Additional comments: x5 days      Last edited by Dollene Primrose, CMA on 03/26/2023  2:05 PM.        Discussed the use of AI scribe software for clinical note transcription with the patient, who gave verbal consent to proceed.  History of Present Illness   The patient, with a history of joint replacements, presented with a week-long history of gastrointestinal upset, fatigue, and throat discomfort. The symptoms began after receiving strep and pneumonia vaccines. Initially, the patient experienced diarrhea and general malaise, which progressed to a fall due to weakness. An ER visit confirmed no damage to the joint replacements or fractures.  Subsequently, the patient developed a sensation of throat swelling, which they associated with a medication they were prescribed for pain and swelling, methocarbamol. They discontinued the medication, suspecting an allergic reaction. Since then, the patient has been experiencing persistent fatigue, alternating between periods of wakefulness and sleep throughout the day.  The patient also reported a sore and swollen throat, with a productive cough and postnasal drip. They found relief with hot liquids and have been taking Benadryl for symptom management. They also reported a persistent, mild headache.  The patient lives alone but  had recent hospital exposure and social interactions. They denied recent travel outside the state, but did go on a cruise in August. A home COVID test was negative.  Since the fall, the patient has been experiencing increased knee discomfort and stiffness. They also reported feeling short of breath, as if they were not getting enough air, despite no history of COPD or asthma. The patient has been maintaining hydration and appetite, despite the ongoing symptoms.     Onset: sudden  Duration: since Thurs - throat seemed to feel swollen   Associated symptoms: fatigue, malaise, GI discomfort, feels like she wants to sleep a lot, sore throat, mildly productive coughing, postnasal drainage, headache  Intervention: warm liquids seem to help, benadryl seems to hep with drying everything out  She does report persistent thirst  Recent sick contacts: She was around other last weekend and was at the hospital last week  Recent travel: none  COVID testing at home:  She tested yesterday   Result: negative    Medications: Outpatient Medications Prior to Visit  Medication Sig   buPROPion (WELLBUTRIN XL) 150 MG 24 hr tablet TAKE 1 TABLET(150 MG) BY MOUTH TWICE DAILY   cholecalciferol (VITAMIN D3) 25 MCG (1000 UNIT) tablet Take 1,000 Units by mouth daily.   diphenhydrAMINE (BENADRYL) 25 MG tablet Take 25 mg by mouth every 6 (six) hours as needed.   famotidine (PEPCID) 20 MG tablet Take 1 tablet (20 mg  total) by mouth 2 (two) times daily as needed for heartburn or indigestion.   meloxicam (MOBIC) 15 MG tablet Take 1 tablet (15 mg total) by mouth daily as needed for pain.   Multiple Vitamins-Minerals (HAIR SKIN AND NAILS FORMULA PO) Take by mouth.   pantoprazole (PROTONIX) 40 MG tablet TAKE 1 TABLET(40 MG) BY MOUTH TWICE DAILY   valACYclovir (VALTREX) 500 MG tablet TAKE 1 TABLET(500 MG) BY MOUTH DAILY   vitamin E 180 MG (400 UNITS) capsule Take 400 Units by mouth daily.   [DISCONTINUED] azelastine (ASTELIN)  0.1 % nasal spray Place into the nose. (Patient not taking: Reported on 12/13/2022)   [DISCONTINUED] benzonatate (TESSALON) 100 MG capsule Take 1-2 capsules (100-200 mg total) by mouth 2 (two) times daily as needed for cough. (Patient not taking: Reported on 10/13/2022)   [DISCONTINUED] methocarbamol (ROBAXIN) 500 MG tablet Take 1 tablet (500 mg total) by mouth every 8 (eight) hours as needed for muscle spasms. (Patient not taking: Reported on 03/16/2023)   [DISCONTINUED] Misc. Devices (PULSE OXIMETER FOR FINGER) MISC 1 each by Does not apply route daily at 12 noon. (Patient not taking: Reported on 03/16/2023)   No facility-administered medications prior to visit.    Review of Systems  Constitutional:  Positive for fatigue.  Respiratory:  Positive for cough.   Neurological:  Positive for headaches.        Objective    BP 118/82   Pulse 98   Temp 98 F (36.7 C) (Oral)   Resp 16   Ht 5\' 4"  (1.626 m)   Wt 190 lb (86.2 kg)   SpO2 99%   BMI 32.61 kg/m     Physical Exam Vitals reviewed.  Constitutional:      General: She is awake.     Appearance: Normal appearance. She is well-developed and well-groomed.  HENT:     Head: Normocephalic and atraumatic.     Right Ear: Hearing, tympanic membrane and ear canal normal.     Left Ear: Hearing, tympanic membrane and ear canal normal.     Mouth/Throat:     Lips: Pink.     Mouth: Mucous membranes are moist.     Pharynx: Oropharynx is clear. Uvula midline. No pharyngeal swelling, oropharyngeal exudate, posterior oropharyngeal erythema, uvula swelling or postnasal drip.  Cardiovascular:     Rate and Rhythm: Normal rate and regular rhythm.     Heart sounds: Normal heart sounds. No murmur heard.    No friction rub. No gallop.  Pulmonary:     Effort: Pulmonary effort is normal.     Breath sounds: Decreased air movement present. Examination of the right-upper field reveals decreased breath sounds. Decreased breath sounds present. No wheezing,  rhonchi or rales.  Lymphadenopathy:     Head:     Right side of head: No submental, submandibular or preauricular adenopathy.     Left side of head: No submental, submandibular or preauricular adenopathy.     Cervical:     Right cervical: No superficial cervical adenopathy.    Left cervical: No superficial cervical adenopathy.     Upper Body:     Right upper body: No supraclavicular adenopathy.     Left upper body: No supraclavicular adenopathy.  Neurological:     Mental Status: She is alert and oriented to person, place, and time.     GCS: GCS eye subscore is 4. GCS verbal subscore is 5. GCS motor subscore is 6.  Psychiatric:        Behavior:  Behavior is cooperative.       Results for orders placed or performed in visit on 03/26/23  POCT Influenza A/B  Result Value Ref Range   Influenza A, POC Negative Negative   Influenza B, POC Negative Negative    Assessment & Plan      No follow-ups on file.      Problem List Items Addressed This Visit   None Visit Diagnoses       Symptoms of upper respiratory infection (URI)    -  Primary   Relevant Medications   azithromycin (ZITHROMAX) 250 MG tablet   methylPREDNISolone (MEDROL DOSEPAK) 4 MG TBPK tablet   Other Relevant Orders   Novel Coronavirus, NAA ([facility])   POCT Influenza A/B (Completed)      Assessment and Plan    Viral Upper Respiratory Infection Symptoms include sore throat, cough, postnasal drip, and fatigue, starting after strep and pneumonia vaccines. Negative for flu- swab results reviewed with patient during apt. Still awaiting COVID results. Differential includes RSV and other viral infections. Symptoms improve with warm liquids and Benadryl. Discussed potential benefits of steroids and azithromycin for inflammation and breathing. Recommended switching to Claritin or Allegra and adding Flonase for congestion. Advised to seek urgent care for severe symptoms like difficulty breathing, fever, chills, or  hypotension. - Prescribe steroid for inflammation and shortness of breath.She reports Medrol is better tolerated than prednisone so will send Medrol dose pack - Prescribe azithromycin for inflammation and cough. - Switch from Benadryl to Claritin or Allegra. - Add Flonase for congestion. - Seek urgent care for severe symptoms. - can use Mucinex and Robitussin for symptom management   Gastrointestinal Upset Diarrhea and stomach discomfort post-vaccination, mildly persistent but improving. Maintained appetite and hydration. Advised on hydration and dietary modifications. - Monitor symptoms and maintain hydration. - Advise dietary modifications.   General Health Maintenance Received strep and pneumonia vaccines during last physical. No recent travel outside the state except for a cruise in August. - Continue routine health maintenance and vaccinations as scheduled.  Follow-up - Send prescriptions to [facility]. - Advise follow-up if symptoms worsen or new symptoms develop.        No follow-ups on file.   I, Emelly Wurtz E Jena Tegeler, PA-C, have reviewed all documentation for this visit. The documentation on 03/26/23 for the exam, diagnosis, procedures, and orders are all accurate and complete.   Jacquelin Hawking, MHS, PA-C [facility]

## 2023-03-26 NOTE — Patient Instructions (Signed)
 Based on your described symptoms and the duration of symptoms it is likely that you have a viral upper respiratory infection (often called a "cold")  Symptoms can last for 3-10 days with lingering cough and intermittent symptoms lasting weeks after that.  The goal of treatment at this time is to reduce your symptoms and discomfort   I recommend using Robitussin and Mucinex (regular formulations, nothing with decongestants or DM)  You can also use Tylenol for body aches and fever reduction I also recommend adding an antihistamine to your daily regimen This includes medications like Claritin, Allegra, Zyrtec- the generics of these work very well and are usually less expensive I recommend using Flonase nasal spray - 2 puffs twice per day to help with your nasal congestion The antihistamines and Flonase can take a few weeks to provide significant relief from allergy symptoms but should start to provide some benefit soon. You can use a humidifier at night to help with preventing nasal dryness and irritation   I have sent in a script for Prednisone taper to be taken in the morning with breakfast per the instructions on the container Remember that steroids can cause sleeplessness, irritability, increased hunger and elevated glucose levels so be mindful of these side effects. They should lessen as you progress to the lower doses of the taper.   If your symptoms do not improve or become worse in the next 5-7 days please make an apt at the office so we can see you  Go to the ER if you begin to have more serious symptoms such as shortness of breath, trouble breathing, loss of consciousness, swelling around the eyes, high fever, severe lasting headaches, vision changes or neck pain/stiffness.

## 2023-03-27 DIAGNOSIS — R0989 Other specified symptoms and signs involving the circulatory and respiratory systems: Secondary | ICD-10-CM | POA: Diagnosis not present

## 2023-03-28 DIAGNOSIS — M25562 Pain in left knee: Secondary | ICD-10-CM | POA: Diagnosis not present

## 2023-03-28 DIAGNOSIS — S83411A Sprain of medial collateral ligament of right knee, initial encounter: Secondary | ICD-10-CM | POA: Diagnosis not present

## 2023-03-29 LAB — NOVEL CORONAVIRUS, NAA: SARS-CoV-2, NAA: NOT DETECTED

## 2023-03-29 LAB — SPECIMEN STATUS REPORT

## 2023-03-29 NOTE — Progress Notes (Signed)
COVID negative.

## 2023-04-05 ENCOUNTER — Ambulatory Visit: Payer: Self-pay

## 2023-04-05 NOTE — Telephone Encounter (Signed)
Summary: Cough, chest rattle, congestion   Cough, chest rattle, congestion   recently finished up a ZPack and Prednisone ... PLease advise       Chief Complaint: Seen in office 03/26/23,still coughing, congested. Symptoms: Above Frequency: 3 weeks Pertinent Negatives: Patient denies fever Disposition: [] ED /[] Urgent Care (no appt availability in office) / [x] Appointment(In office/virtual)/ []  [facility]/ [] Home Care/ [] Refused Recommended Disposition /[] [facility]/ []  Follow-up with PCP Additional Notes: Agrees with appointment.  Reason for Disposition  SEVERE coughing spells (e.g., whooping sound after coughing, vomiting after coughing)  Answer Assessment - Initial Assessment Questions 1. ONSET: "When did the cough begin?"      3 weeks 2. SEVERITY: "How bad is the cough today?"      Severe 3. SPUTUM: "Describe the color of your sputum" (none, dry cough; clear, white, yellow, green)     Yellow-green 4. HEMOPTYSIS: "Are you coughing up any blood?" If so ask: "How much?" (flecks, streaks, tablespoons, etc.)     No 5. DIFFICULTY BREATHING: "Are you having difficulty breathing?" If Yes, ask: "How bad is it?" (e.g., mild, moderate, severe)    - MILD: No SOB at rest, mild SOB with walking, speaks normally in sentences, can lie down, no retractions, pulse < 100.    - MODERATE: SOB at rest, SOB with minimal exertion and prefers to sit, cannot lie down flat, speaks in phrases, mild retractions, audible wheezing, pulse 100-120.    - SEVERE: Very SOB at rest, speaks in single words, struggling to breathe, sitting hunched forward, retractions, pulse > 120      Mild 6. FEVER: "Do you have a fever?" If Yes, ask: "What is your temperature, how was it measured, and when did it start?"     No 7. CARDIAC HISTORY: "Do you have any history of heart disease?" (e.g., heart attack, congestive heart failure)      No 8. LUNG HISTORY: "Do you have any history of lung disease?"  (e.g.,  pulmonary embolus, asthma, emphysema)     No 9. PE RISK FACTORS: "Do you have a history of blood clots?" (or: recent major surgery, recent prolonged travel, bedridden)     No 10. OTHER SYMPTOMS: "Do you have any other symptoms?" (e.g., runny nose, wheezing, chest pain)       Rattling 11. PREGNANCY: "Is there any chance you are pregnant?" "When was your last menstrual period?"       No 12. TRAVEL: "Have you traveled out of the country in the last month?" (e.g., travel history, exposures)       No  Protocols used: Cough - Acute Productive-A-AH

## 2023-04-06 ENCOUNTER — Ambulatory Visit
Admission: RE | Admit: 2023-04-06 | Discharge: 2023-04-06 | Disposition: A | Discharge status: Home / Self Care | Payer: Medicare Other | Attending: Internal Medicine | Admitting: Internal Medicine

## 2023-04-06 ENCOUNTER — Ambulatory Visit (INDEPENDENT_AMBULATORY_CARE_PROVIDER_SITE_OTHER): Payer: Medicare Other | Admitting: Internal Medicine

## 2023-04-06 ENCOUNTER — Ambulatory Visit
Admission: RE | Admit: 2023-04-06 | Discharge: 2023-04-06 | Disposition: A | Discharge status: Home / Self Care | Payer: Medicare Other | Source: Ambulatory Visit | Attending: Internal Medicine | Admitting: Internal Medicine

## 2023-04-06 ENCOUNTER — Encounter: Payer: Self-pay | Admitting: Internal Medicine

## 2023-04-06 VITALS — BP 122/74 | HR 90 | Resp 16 | Ht 64.0 in | Wt 190.0 lb

## 2023-04-06 DIAGNOSIS — K449 Diaphragmatic hernia without obstruction or gangrene: Secondary | ICD-10-CM | POA: Diagnosis not present

## 2023-04-06 DIAGNOSIS — B379 Candidiasis, unspecified: Secondary | ICD-10-CM | POA: Diagnosis not present

## 2023-04-06 DIAGNOSIS — J0100 Acute maxillary sinusitis, unspecified: Secondary | ICD-10-CM

## 2023-04-06 DIAGNOSIS — R052 Subacute cough: Secondary | ICD-10-CM | POA: Insufficient documentation

## 2023-04-06 DIAGNOSIS — I700 Atherosclerosis of aorta: Secondary | ICD-10-CM | POA: Diagnosis not present

## 2023-04-06 DIAGNOSIS — R059 Cough, unspecified: Secondary | ICD-10-CM | POA: Diagnosis not present

## 2023-04-06 DIAGNOSIS — I517 Cardiomegaly: Secondary | ICD-10-CM | POA: Diagnosis not present

## 2023-04-06 MED ORDER — FLUCONAZOLE 150 MG PO TABS: 150.0000 mg | ORAL_TABLET | Freq: Once | ORAL | 0 refills | Status: AC

## 2023-04-06 MED ORDER — AMOXICILLIN-POT CLAVULANATE 875-125 MG PO TABS: 1.0000 | ORAL_TABLET | Freq: Two times a day (BID) | ORAL | 0 refills | Status: AC

## 2023-04-06 MED ORDER — HYDROCOD POLI-CHLORPHE POLI ER 10-8 MG/5ML PO SUER: 5.0000 mL | Freq: Two times a day (BID) | ORAL | 0 refills | Status: AC | PRN

## 2023-04-06 NOTE — Progress Notes (Signed)
Acute Office Visit  Subjective:     Patient ID: Olivia Morgan, female    DOB: January 13, 1956, 67 y.o.   MRN: [id number]  Chief Complaint  Patient presents with   Cough    Constant- mostly non-productive. Finished last antibiotics and steroid pack.    Cough Pertinent negatives include no chest pain, chills, ear pain, fever, sore throat, shortness of breath or wheezing.   Patient is in today for ongoing cough. Was seen in the office 03/26/23 and was given a Z-pack and steroids. COVID and flu test negative at that time. Symptoms ongoing for 2 weeks. Originally thought it was an allergic reaction to a medication because first symptom was throat swelling and pain.   URI: -Fever: no -Cough: yes - having coughing fits usually worse at night, feels like something caught in throat, mainly dry non=productive  -Shortness of breath: no -Wheezing: yes -Chest tightness: yes -Chest congestion: yes -Nasal congestion: yes -Runny nose: yes - thick and clear with some yellow -Sore throat: no -Sinus pressure: no -Ear pain: no  -Ear pressure: yes left -Sick contacts: no -Context: fluctuating  -Treatments attempted: Benadryl, Zyrtec, Mucinex, tessalon perles, Azithromycin and Medrol, nothing helping  Review of Systems  Constitutional:  Negative for chills and fever.  HENT:  Positive for congestion. Negative for ear pain and sore throat.   Respiratory:  Positive for cough and sputum production. Negative for shortness of breath and wheezing.   Cardiovascular:  Negative for chest pain.        Objective:    BP 122/74   Pulse 90   Resp 16   Ht 5\' 4"  (1.626 m)   Wt 190 lb (86.2 kg)   SpO2 99%   BMI 32.61 kg/m    Physical Exam Constitutional:      Appearance: Normal appearance.  HENT:     Head: Normocephalic and atraumatic.     Right Ear: Tympanic membrane, ear canal and external ear normal.     Left Ear: Tympanic membrane, ear canal and external ear normal.     Nose: Congestion  present.     Mouth/Throat:     Mouth: Mucous membranes are moist.     Pharynx: Posterior oropharyngeal erythema present.  Eyes:     Conjunctiva/sclera: Conjunctivae normal.  Cardiovascular:     Rate and Rhythm: Normal rate and regular rhythm.  Pulmonary:     Effort: Pulmonary effort is normal.     Breath sounds: Rhonchi present. No wheezing or rales.  Skin:    General: Skin is warm and dry.  Neurological:     General: No focal deficit present.     Mental Status: She is alert. Mental status is at baseline.  Psychiatric:        Mood and Affect: Mood normal.        Behavior: Behavior normal.     No results found for any visits on 04/06/23.      Assessment & Plan:   1. Subacute maxillary sinusitis (Primary)/Subacute cough/Yeast infection: Symptoms not responding to Azithromycin or steroids. Will prescribe Augmentin and obtain a chest x-ray. Cough worse symptom, will prescribe Tussionex to take every 12 hours PRN. Follow up if symptoms worsen or fail to improve. Patient prone to yeast infections with penicillins, will go ahead and prescribe Diflucan.  - amoxicillin-clavulanate (AUGMENTIN) 875-125 MG tablet; Take 1 tablet by mouth 2 (two) times daily for 5 days.  Dispense: 10 tablet; Refill: 0 - DG Chest 2 View; Future - chlorpheniramine-HYDROcodone (TUSSIONEX)  10-8 MG/5ML; Take 5 mLs by mouth every 12 (twelve) hours as needed for up to 5 days for cough.  Dispense: 50 mL; Refill: 0 - fluconazole (DIFLUCAN) 150 MG tablet; Take 1 tablet (150 mg total) by mouth once for 1 dose.  Dispense: 3 tablet; Refill: 0   Return if symptoms worsen or fail to improve.  Olivia Mail, DO

## 2023-04-10 ENCOUNTER — Telehealth: Payer: Self-pay | Admitting: Family Medicine

## 2023-04-10 NOTE — Telephone Encounter (Signed)
Patient notified we do not have results back at this time

## 2023-04-10 NOTE — Telephone Encounter (Signed)
 Copied from CRM [id number]. Topic: General - Inquiry >> Apr 10, 2023 11:07 AM De Blanch wrote: Reason for CRM: Pt is calling to f/u on recent imaging for chest x-rays.  Please advise.

## 2023-04-12 NOTE — Telephone Encounter (Signed)
 Called Radiologist and ask them to check on CXR and send to patient chart

## 2023-04-12 NOTE — Telephone Encounter (Signed)
 Pt called in again, checking on chest ray results. Please cb

## 2023-04-16 DIAGNOSIS — D225 Melanocytic nevi of trunk: Secondary | ICD-10-CM | POA: Diagnosis not present

## 2023-04-16 DIAGNOSIS — Z1283 Encounter for screening for malignant neoplasm of skin: Secondary | ICD-10-CM | POA: Diagnosis not present

## 2023-05-14 DIAGNOSIS — M25561 Pain in right knee: Secondary | ICD-10-CM | POA: Diagnosis not present

## 2023-05-16 ENCOUNTER — Ambulatory Visit
Admission: RE | Admit: 2023-05-16 | Discharge: 2023-05-16 | Disposition: A | Discharge status: Home / Self Care | Payer: Medicare Other | Source: Ambulatory Visit | Attending: Internal Medicine | Admitting: Internal Medicine

## 2023-05-16 DIAGNOSIS — Z1231 Encounter for screening mammogram for malignant neoplasm of breast: Secondary | ICD-10-CM | POA: Diagnosis not present

## 2023-05-18 DIAGNOSIS — R7303 Prediabetes: Secondary | ICD-10-CM | POA: Diagnosis not present

## 2023-05-18 DIAGNOSIS — Z13220 Encounter for screening for lipoid disorders: Secondary | ICD-10-CM | POA: Diagnosis not present

## 2023-05-18 DIAGNOSIS — Z0000 Encounter for general adult medical examination without abnormal findings: Secondary | ICD-10-CM | POA: Diagnosis not present

## 2023-05-19 LAB — LIPID PANEL
Chol/HDL Ratio: 2.6 {ratio} (ref 0.0–4.4)
Cholesterol, Total: 162 mg/dL (ref 100–199)
HDL: 63 mg/dL (ref >39)
LDL Chol Calc (NIH): 83 mg/dL (ref 0–99)
Triglycerides: 83 mg/dL (ref 0–149)
VLDL Cholesterol Cal: 16 mg/dL (ref 5–40)

## 2023-05-19 LAB — CBC WITH DIFFERENTIAL/PLATELET
Basophils Absolute: 0.1 10*3/uL (ref 0.0–0.2)
Basos: 1 %
EOS (ABSOLUTE): 0.3 10*3/uL (ref 0.0–0.4)
Eos: 5 %
Hematocrit: 45.1 % (ref 34.0–46.6)
Hemoglobin: 14.4 g/dL (ref 11.1–15.9)
Immature Grans (Abs): 0 10*3/uL (ref 0.0–0.1)
Immature Granulocytes: 1 %
Lymphocytes Absolute: 1 10*3/uL (ref 0.7–3.1)
Lymphs: 19 %
MCH: 28.8 pg (ref 26.6–33.0)
MCHC: 31.9 g/dL (ref 31.5–35.7)
MCV: 90 fL (ref 79–97)
Monocytes Absolute: 0.4 10*3/uL (ref 0.1–0.9)
Monocytes: 7 %
Neutrophils Absolute: 3.5 10*3/uL (ref 1.4–7.0)
Neutrophils: 67 %
Platelets: 220 10*3/uL (ref 150–450)
RBC: 5 x10E6/uL (ref 3.77–5.28)
RDW: 13.1 % (ref 11.7–15.4)
WBC: 5.2 10*3/uL (ref 3.4–10.8)

## 2023-05-19 LAB — COMPREHENSIVE METABOLIC PANEL
ALT: 12 [IU]/L (ref 0–32)
AST: 13 [IU]/L (ref 0–40)
Albumin: 4.4 g/dL (ref 3.9–4.9)
Alkaline Phosphatase: 78 [IU]/L (ref 44–121)
BUN/Creatinine Ratio: 13 (ref 12–28)
BUN: 12 mg/dL (ref 8–27)
Bilirubin Total: 0.4 mg/dL (ref 0.0–1.2)
CO2: 24 mmol/L (ref 20–29)
Calcium: 9.7 mg/dL (ref 8.7–10.3)
Chloride: 102 mmol/L (ref 96–106)
Creatinine, Ser: 0.95 mg/dL (ref 0.57–1.00)
Globulin, Total: 2.4 g/dL (ref 1.5–4.5)
Glucose: 96 mg/dL (ref 70–99)
Potassium: 4.3 mmol/L (ref 3.5–5.2)
Sodium: 141 mmol/L (ref 134–144)
Total Protein: 6.8 g/dL (ref 6.0–8.5)
eGFR: 65 mL/min/{1.73_m2} (ref >59)

## 2023-05-19 LAB — HEMOGLOBIN A1C
Est. average glucose Bld gHb Est-mCnc: 120 mg/dL
Hgb A1c MFr Bld: 5.8 % — ABNORMAL HIGH (ref 4.8–5.6)

## 2023-05-21 ENCOUNTER — Encounter: Payer: Self-pay | Admitting: Internal Medicine

## 2023-05-29 ENCOUNTER — Other Ambulatory Visit: Payer: Self-pay | Admitting: Family Medicine

## 2023-05-29 DIAGNOSIS — F320 Major depressive disorder, single episode, mild: Secondary | ICD-10-CM

## 2023-07-02 ENCOUNTER — Encounter

## 2023-07-09 ENCOUNTER — Ambulatory Visit

## 2023-07-09 DIAGNOSIS — K222 Esophageal obstruction: Secondary | ICD-10-CM | POA: Diagnosis not present

## 2023-07-09 DIAGNOSIS — K219 Gastro-esophageal reflux disease without esophagitis: Secondary | ICD-10-CM | POA: Diagnosis present

## 2023-07-09 DIAGNOSIS — R1310 Dysphagia, unspecified: Secondary | ICD-10-CM | POA: Diagnosis not present

## 2023-08-10 ENCOUNTER — Other Ambulatory Visit: Payer: Self-pay | Admitting: Family Medicine

## 2023-08-10 DIAGNOSIS — M545 Low back pain, unspecified: Secondary | ICD-10-CM

## 2023-08-10 DIAGNOSIS — G8929 Other chronic pain: Secondary | ICD-10-CM

## 2023-08-13 NOTE — Telephone Encounter (Signed)
 Requested medication (s) are due for refill today: Yes  Requested medication (s) are on the active medication list: Yes  Last refill:  04/17/22  Future visit scheduled: No  Notes to clinic:  Unable to refill per protocol, appointment needed. Unsure if patient is still following PCP for this, routing for approval     Requested Prescriptions  Pending Prescriptions Disp Refills   meloxicam  (MOBIC ) 15 MG tablet [Pharmacy Med Name: MELOXICAM  15MG  TABLETS] 90 tablet 3    Sig: TAKE 1 TABLET(15 MG) BY MOUTH DAILY AS NEEDED FOR PAIN     Analgesics:  COX2 Inhibitors Failed - 08/13/2023  8:24 PM      Failed - Manual Review: Labs are only required if the patient has taken medication for more than 8 weeks.      Failed - Valid encounter within last 12 months    Recent Outpatient Visits   None            Passed - HGB in normal range and within 360 days    Hemoglobin  Date Value Ref Range Status  05/18/2023 14.4 11.1 - 15.9 g/dL Final         Passed - Cr in normal range and within 360 days    Creatinine, Ser  Date Value Ref Range Status  05/18/2023 0.95 0.57 - 1.00 mg/dL Final         Passed - HCT in normal range and within 360 days    Hematocrit  Date Value Ref Range Status  05/18/2023 45.1 34.0 - 46.6 % Final         Passed - AST in normal range and within 360 days    AST  Date Value Ref Range Status  05/18/2023 13 0 - 40 IU/L Final         Passed - ALT in normal range and within 360 days    ALT  Date Value Ref Range Status  05/18/2023 12 0 - 32 IU/L Final         Passed - eGFR is 30 or above and within 360 days    GFR calc Af Amer  Date Value Ref Range Status  01/09/2020 66 >59 mL/min/1.73 Final    Comment:    **[facility] currently reports eGFR in compliance with the current**   recommendations of the [employer]. [facility] will   update reporting as new guidelines are published from the NKF-ASN   Task force.    GFR calc non Af Amer  Date Value Ref  Range Status  01/09/2020 57 (L) >59 mL/min/1.73 Final   eGFR  Date Value Ref Range Status  05/18/2023 65 >59 mL/min/1.73 Final         Passed - Patient is not pregnant

## 2023-08-30 ENCOUNTER — Other Ambulatory Visit: Payer: Self-pay | Admitting: Internal Medicine

## 2023-08-30 ENCOUNTER — Other Ambulatory Visit: Payer: Self-pay | Admitting: Family Medicine

## 2023-08-30 ENCOUNTER — Telehealth: Payer: Self-pay | Admitting: Internal Medicine

## 2023-08-30 ENCOUNTER — Other Ambulatory Visit: Payer: Self-pay | Admitting: Emergency Medicine

## 2023-08-30 DIAGNOSIS — F320 Major depressive disorder, single episode, mild: Secondary | ICD-10-CM

## 2023-08-30 DIAGNOSIS — K2100 Gastro-esophageal reflux disease with esophagitis, without bleeding: Secondary | ICD-10-CM

## 2023-08-30 DIAGNOSIS — M545 Low back pain, unspecified: Secondary | ICD-10-CM

## 2023-08-30 DIAGNOSIS — K449 Diaphragmatic hernia without obstruction or gangrene: Secondary | ICD-10-CM

## 2023-08-30 DIAGNOSIS — Z760 Encounter for issue of repeat prescription: Secondary | ICD-10-CM

## 2023-08-30 NOTE — Telephone Encounter (Signed)
Order sent for refill

## 2023-08-30 NOTE — Telephone Encounter (Signed)
 [facility] [address]  valACYclovir  (VALTREX ) 500 MG tablet

## 2023-08-31 MED ORDER — VALACYCLOVIR HCL 500 MG PO TABS: ORAL_TABLET | ORAL | 1 refills | Status: AC

## 2023-08-31 NOTE — Telephone Encounter (Signed)
 Requested Prescriptions  Pending Prescriptions Disp Refills   pantoprazole  (PROTONIX ) 40 MG tablet [Pharmacy Med Name: PANTOPRAZOLE  40MG  TABLETS] 180 tablet 0    Sig: TAKE 1 TABLET(40 MG) BY MOUTH TWICE DAILY     Gastroenterology: Proton Pump Inhibitors Failed - 08/31/2023  1:47 PM      Failed - Valid encounter within last 12 months    Recent Outpatient Visits   None

## 2023-08-31 NOTE — Telephone Encounter (Signed)
 Patient will need an office visit for additional refills. Requested Prescriptions  Pending Prescriptions Disp Refills   buPROPion  (WELLBUTRIN  XL) 150 MG 24 hr tablet [Pharmacy Med Name: BUPROPION  XL 150MG  TABLETS (24 H)] 180 tablet 0    Sig: TAKE 1 TABLET(150 MG) BY MOUTH TWICE DAILY     Psychiatry: Antidepressants - bupropion  Failed - 08/31/2023  3:50 PM      Failed - Valid encounter within last 6 months    Recent Outpatient Visits   None            Passed - Cr in normal range and within 360 days    Creatinine, Ser  Date Value Ref Range Status  05/18/2023 0.95 0.57 - 1.00 mg/dL Final         Passed - AST in normal range and within 360 days    AST  Date Value Ref Range Status  05/18/2023 13 0 - 40 IU/L Final         Passed - ALT in normal range and within 360 days    ALT  Date Value Ref Range Status  05/18/2023 12 0 - 32 IU/L Final         Passed - Completed PHQ-2 or PHQ-9 in the last 360 days      Passed - Last BP in normal range    BP Readings from Last 1 Encounters:  04/06/23 122/74

## 2023-08-31 NOTE — Telephone Encounter (Signed)
 OFFICE VISIT NEEDED FOR ADDITIONAL REFILLS   Requested Prescriptions  Pending Prescriptions Disp Refills   meloxicam  (MOBIC ) 15 MG tablet [Pharmacy Med Name: MELOXICAM  15MG  TABLETS] 30 tablet 0    Sig: TAKE 1 TABLET(15 MG) BY MOUTH DAILY     Analgesics:  COX2 Inhibitors Failed - 08/31/2023  2:38 PM      Failed - Manual Review: Labs are only required if the patient has taken medication for more than 8 weeks.      Failed - Valid encounter within last 12 months    Recent Outpatient Visits   None            Passed - HGB in normal range and within 360 days    Hemoglobin  Date Value Ref Range Status  05/18/2023 14.4 11.1 - 15.9 g/dL Final         Passed - Cr in normal range and within 360 days    Creatinine, Ser  Date Value Ref Range Status  05/18/2023 0.95 0.57 - 1.00 mg/dL Final         Passed - HCT in normal range and within 360 days    Hematocrit  Date Value Ref Range Status  05/18/2023 45.1 34.0 - 46.6 % Final         Passed - AST in normal range and within 360 days    AST  Date Value Ref Range Status  05/18/2023 13 0 - 40 IU/L Final         Passed - ALT in normal range and within 360 days    ALT  Date Value Ref Range Status  05/18/2023 12 0 - 32 IU/L Final         Passed - eGFR is 30 or above and within 360 days    GFR calc Af Amer  Date Value Ref Range Status  01/09/2020 66 >59 mL/min/1.73 Final    Comment:    **[facility] currently reports eGFR in compliance with the current**   recommendations of the [employer]. [facility] will   update reporting as new guidelines are published from the NKF-ASN   Task force.    GFR calc non Af Amer  Date Value Ref Range Status  01/09/2020 57 (L) >59 mL/min/1.73 Final   eGFR  Date Value Ref Range Status  05/18/2023 65 >59 mL/min/1.73 Final         Passed - Patient is not pregnant

## 2023-09-14 ENCOUNTER — Other Ambulatory Visit: Payer: Self-pay | Admitting: Gastroenterology

## 2023-09-14 DIAGNOSIS — K219 Gastro-esophageal reflux disease without esophagitis: Secondary | ICD-10-CM

## 2023-09-14 DIAGNOSIS — K449 Diaphragmatic hernia without obstruction or gangrene: Secondary | ICD-10-CM

## 2023-09-14 DIAGNOSIS — R1310 Dysphagia, unspecified: Secondary | ICD-10-CM

## 2023-09-17 ENCOUNTER — Ambulatory Visit
Admission: RE | Admit: 2023-09-17 | Discharge: 2023-09-17 | Disposition: A | Discharge status: Home / Self Care | Source: Ambulatory Visit | Attending: Gastroenterology | Admitting: Gastroenterology

## 2023-09-17 DIAGNOSIS — K219 Gastro-esophageal reflux disease without esophagitis: Secondary | ICD-10-CM | POA: Insufficient documentation

## 2023-09-17 DIAGNOSIS — K449 Diaphragmatic hernia without obstruction or gangrene: Secondary | ICD-10-CM | POA: Insufficient documentation

## 2023-09-17 DIAGNOSIS — R1310 Dysphagia, unspecified: Secondary | ICD-10-CM | POA: Insufficient documentation

## 2023-09-18 ENCOUNTER — Ambulatory Visit: Payer: Medicare Other | Admitting: Family Medicine

## 2023-10-10 ENCOUNTER — Encounter: Payer: Self-pay | Admitting: Surgery

## 2023-10-10 ENCOUNTER — Ambulatory Visit: Admitting: Surgery

## 2023-10-10 VITALS — BP 129/80 | HR 79 | Ht 64.0 in | Wt 187.0 lb

## 2023-10-10 DIAGNOSIS — K219 Gastro-esophageal reflux disease without esophagitis: Secondary | ICD-10-CM | POA: Diagnosis not present

## 2023-10-10 DIAGNOSIS — K449 Diaphragmatic hernia without obstruction or gangrene: Secondary | ICD-10-CM | POA: Diagnosis not present

## 2023-10-10 NOTE — Patient Instructions (Addendum)
 We need you to have a CT scan done for further assessment of your hiatal hernia.  You may schedule this by calling [phone]. Please allow at least one week out for scheduling to allow us  time to obtain authorization for this scan from your insurance company.    We will have you follow up here in a few weeks once we get your CT scan results.    Hiatal Hernia  A hiatal hernia occurs when part of the stomach slides above the muscle that separates the abdomen from the chest (diaphragm). A person can be born with a hiatal hernia (congenital), or it may develop over time. In almost all cases of hiatal hernia, only the top part of the stomach pushes through the diaphragm. Many people have a hiatal hernia with no symptoms. The larger the hernia, the more likely it is that you will have symptoms. In some cases, a hiatal hernia allows stomach acid to flow back into the tube that carries food from your mouth to your stomach (esophagus). This may cause heartburn symptoms. The development of heartburn symptoms may mean that you have a condition called gastroesophageal reflux disease (GERD). What are the causes? This condition is caused by a weakness in the opening (hiatus) where the esophagus passes through the diaphragm to attach to the upper part of the stomach. A person may be born with a weakness in the hiatus, or a weakness can develop over time. What increases the risk? This condition is more likely to develop in: Older people. Age is a major risk factor for a hiatal hernia, especially if you are over the age of 44. Pregnant women. People who are overweight. People who have frequent constipation. What are the signs or symptoms? Symptoms of this condition usually develop in the form of GERD symptoms. Symptoms include: Heartburn. Upset stomach (indigestion). Trouble swallowing. Coughing or wheezing. Wheezing is making high-pitched whistling sounds when you breathe. Sore throat. Chest  pain. Nausea and vomiting. How is this diagnosed? This condition may be diagnosed during testing for GERD. Tests that may be done include: X-rays of your stomach or chest. An upper gastrointestinal (GI) series. This is an X-ray exam of your GI tract that is taken after you swallow a chalky liquid that shows up clearly on the X-ray. Endoscopy. This is a procedure to look into your stomach using a thin, flexible tube that has a tiny camera and light on the end of it. How is this treated? This condition may be treated by: Dietary and lifestyle changes to help reduce GERD symptoms. Medicines. These may include: Over-the-counter antacids. Medicines that make your stomach empty more quickly. Medicines that block the production of stomach acid (H2 blockers). Stronger medicines to reduce stomach acid (proton pump inhibitors). Surgery to repair the hernia, if other treatments are not helping. If you have no symptoms, you may not need treatment. Follow these instructions at home: Lifestyle and activity Do not use any products that contain nicotine or tobacco. These products include cigarettes, chewing tobacco, and vaping devices, such as e-cigarettes. If you need help quitting, ask your health care provider. Try to achieve and maintain a healthy body weight. Avoid putting pressure on your abdomen. Anything that puts pressure on your abdomen increases the amount of acid that may be pushed up into your esophagus. Avoid bending over, especially after eating. Raise the head of your bed by putting blocks under the legs. This keeps your head and esophagus higher than your stomach. Do not wear tight  clothing around your chest or stomach. Try not to strain when having a bowel movement, when urinating, or when lifting heavy objects. Eating and drinking Avoid foods that can worsen GERD symptoms. These may include: Fatty foods, like fried foods. Citrus fruits, like oranges or lemon. Other foods and drinks  that contain acid, like orange juice or tomatoes. Spicy food. Chocolate. Eat frequent small meals instead of three large meals a day. This helps prevent your stomach from getting too full. Eat slowly. Do not lie down right after eating. Do not eat 1-2 hours before bed. Do not drink beverages with caffeine. These include cola, coffee, cocoa, and tea. Do not drink alcohol. General instructions Take over-the-counter and prescription medicines only as told by your health care provider. Keep all follow-up visits. Your health care provider will want to check that any new prescribed medicines are helping your symptoms. Contact a health care provider if: Your symptoms are not controlled with medicines or lifestyle changes. You are having trouble swallowing. You have coughing or wheezing that will not go away. Your pain is getting worse. Your pain spreads to your arms, neck, jaw, teeth, or back. You feel nauseous or you vomit. Get help right away if: You have shortness of breath. You vomit blood. You have bright red blood in your stools. You have black, tarry stools. These symptoms may be an emergency. Get help right away. Call 911. Do not wait to see if the symptoms will go away. Do not drive yourself to the hospital. Summary A hiatal hernia occurs when part of the stomach slides above the muscle that separates the abdomen from the chest. A person may be born with a weakness in the hiatus, or a weakness can develop over time. Symptoms of a hiatal hernia may include heartburn, trouble swallowing, or sore throat. Management of a hiatal hernia includes eating frequent small meals instead of three large meals a day. Get help right away if you vomit blood, have bright red blood in your stools, or have black, tarry stools.   Surgery to Stop Reflux (Laparoscopic Nissen Fundoplication): What to Expect  Laparoscopic Nissen fundoplication is a surgery to help with symptoms of gastroesophageal  reflux disease (GERD). In GERD, stomach contents and acid back up into your esophagus. Your esophagus is the part of your body that moves food from your mouth to your stomach. Normally, the muscle between your stomach and esophagus keeps stomach contents and acid in your stomach. If you have GERD, that muscle doesn't work like it should. This means stomach contents and acid may flow back up (reflux). You may need this surgery if other treatments for GERD haven't helped. In this surgery, the upper part of your stomach is wrapped around the lower end of your esophagus and stitched together. Tell a health care provider about: Any allergies you have. All medicines you take. These include vitamins, herbs, eye drops, and creams. Any problems you or family members have had with anesthesia. Any bleeding problems you have. Any surgeries you've had. Any medical problems you have. Whether you're pregnant or may be pregnant. What are the risks? Your health care provider will talk with you about risks. These may include: Infection. Bleeding or blood clots. Damage to other structures or organs. These include the stomach, esophagus, lungs, liver, and spleen. Trouble swallowing. Not being able to burp. This can cause bloating. Allergic reactions to medicines. Other risks may include: A tear in the stitches used for the wrap. A hole in your lung.  A hole in your esophagus or stomach.

## 2023-10-12 ENCOUNTER — Encounter: Payer: Self-pay | Admitting: Surgery

## 2023-10-12 NOTE — Progress Notes (Signed)
 Surgical Consultation    Olivia Morgan is an 68 y.o. female.   Chief Complaint  Patient presents with   New Patient (Initial Visit)    Hernia      HPI: Olivia Morgan is a 68 year old very pleasant female with long history of reflux.  She has been on PPI and this has been a chronic problem.  Now she is on PPI and H2 blocker with only partial relief of symptoms.  She does endorse having significant reflux symptoms that are worse at night and she also wakes up in the middle of the night with regurgitation.  She does have acid taste in her mouth.  She also had some chronic pulmonary issues and frequent cough.  She reports some mild dysphagia and she notes that she has adapted her way she eats food and takes quite a bit of time.  I do not think that she has had a prior food impaction. She Did have a long history of reflux and was followed by Dr. Dessa in the past, has had prior endoscopy with multiple dilations.  She did have a most recent upper endoscopy a couple months ago that have personally reviewed showing evidence of a large hiatal hernia as well as an esophageal stricture that was dilated.  He had also multiple gastric polyps. She also had recent barium swallow showing a large type III paraesophageal hernia.  Also exhibited reflux. She is very independent and is able to perform more than 4 METS of activity without any shortness of breath or chest pain. He has had multiple orthopedic surgeries including knee replacements.   Past Medical History:  Diagnosis Date   Acute pain of left shoulder 06/08/2020   Allergic reaction 02/12/2017   At risk for difficult intubation on preoperative anesthesia assessment    Bilateral hearing loss    wears hearing aids   COVID-19 04/2022   Depression    Genital herpes    GERD (gastroesophageal reflux disease)    Herpes simplex virus infection    History of migraine    Murmur    Neuropathy of both feet 06/05/2015   OA (osteoarthritis) of hip     left   Osteopenia of lumbar spine 2010   Plantar fasciitis    Stomatitis    Wears hearing aid in both ears     Past Surgical History:  Procedure Laterality Date   CATARACT EXTRACTION W/PHACO Right 01/02/2023   Procedure: CATARACT EXTRACTION PHACO AND INTRAOCULAR LENS PLACEMENT (IOC) RIGHT   CLAREON VIVITY TORIC LENS 6.11 00:33.5;  Surgeon: Jaye Fallow, MD;  Location: [facility];  Service: Ophthalmology;  Laterality: Right;   CATARACT EXTRACTION W/PHACO Left 01/16/2023   Procedure: CATARACT EXTRACTION PHACO AND INTRAOCULAR LENS PLACEMENT (IOC) LEFT CLAREON VIVITY TORIC LENS 4.26 00:31.0;  Surgeon: Jaye Fallow, MD;  Location: [facility];  Service: Ophthalmology;  Laterality: Left;   CESAREAN SECTION     COLONOSCOPY  2010   COLONOSCOPY WITH PROPOFOL  N/A 11/27/2018   Procedure: COLONOSCOPY WITH PROPOFOL ;  Surgeon: Unk Corinn Skiff, MD;  Location: [facility];  Service: Endoscopy;  Laterality: N/A;   ENDOSCOPIC CONCHA BULLOSA RESECTION Left 05/23/2018   Procedure: ENDOSCOPIC CONCHA BULLOSA RESECTION;  Surgeon: Edda Mt, MD;  Location: [facility];  Service: ENT;  Laterality: Left;   FOOT SURGERY Right    REPLACEMENT TOTAL KNEE  3/13   Dr[facility]Hallows at [facility]   SEPTOPLASTY Bilateral 05/23/2018   Procedure: SEPTOPLASTY;  Surgeon: Edda Mt, MD;  Location: [facility];  Service: ENT;  Laterality: Bilateral;   TOTAL HIP ARTHROPLASTY Left 10/2008   TURBINATE REDUCTION Bilateral 05/23/2018   Procedure: INFERIOR TURBINATE REDUCTION;  Surgeon: Edda Mt, MD;  Location: [facility];  Service: ENT;  Laterality: Bilateral;   UPPER GI ENDOSCOPY  1992    Family History  Problem Relation Age of Onset   Aneurysm Mother    Stroke Mother    Heart disease Mother    Heart disease Father 69   Hypertension Father    Lung disease Father    Heart disease Paternal Grandfather    Panic disorder Brother    Milk intolerance Daughter     Polycystic ovary syndrome Daughter    Obesity Brother    Hyperlipidemia Brother    Hypertension Brother    Diabetes Brother    Cancer Neg Hx    COPD Neg Hx    Breast cancer Neg Hx     Social History:  reports that she has never smoked. She has been exposed to tobacco smoke. She has never used smokeless tobacco. She reports current alcohol use. She reports that she does not use drugs.  Allergies:  Allergies  Allergen Reactions   Levofloxacin Hives   Morphine Nausea Only   Celecoxib Other (See Comments)    SOB   Celexa [Citalopram Hydrobromide]    Citrullus Vulgaris Swelling    Per pt says causes throat to swell   Cymbalta [Duloxetine Hcl] Hives   Gabapentin  Other (See Comments)    anger   Levaquin [Levofloxacin In D5w] Hives   Valium [Diazepam] Other (See Comments)    hallucinations   Monistat [Miconazole] Rash   Prednisone  Anxiety    Medications reviewed.     ROS Full ROS performed and is otherwise negative other than what is stated in the HPI    BP 129/80   Pulse 79   Ht 5' 4 (1.626 m)   Wt 187 lb (84.8 kg)   SpO2 98%   BMI 32.10 kg/m   Physical Exam Vitals and nursing note reviewed. Exam conducted with a chaperone present.  Constitutional:      General: She is not in acute distress.    Appearance: Normal appearance. She is not ill-appearing.  Cardiovascular:     Rate and Rhythm: Normal rate and regular rhythm.     Heart sounds: No murmur heard. Pulmonary:     Effort: Pulmonary effort is normal. No respiratory distress.     Breath sounds: Normal breath sounds. No stridor. No wheezing or rhonchi.  Abdominal:     General: Abdomen is flat. There is no distension.     Palpations: There is no mass.     Tenderness: There is no abdominal tenderness. There is no guarding or rebound.     Hernia: No hernia is present.  Musculoskeletal:        General: No swelling or tenderness. Normal range of motion.     Cervical back: Normal range of motion and neck  supple. No rigidity or tenderness.  Skin:    General: Skin is warm and dry.     Capillary Refill: Capillary refill takes less than 2 seconds.  Neurological:     General: No focal deficit present.     Mental Status: She is alert and oriented to person, place, and time.  Psychiatric:        Mood and Affect: Mood normal.        Behavior: Behavior normal.  Thought Content: Thought content normal.        Judgment: Judgment normal.     Assessment/Plan: 68 year old female with symptomatic paraesophageal hernia type III with a significant portion of her stomach within the mediastinum.  Discussed with the patient in detail about her disease process.  I do think that she will require repair given symptoms and given her pathology.  I would like to further investigate her anatomy with a dedicated CT scan of the abdomen pelvis with contrast   I will be happy to see her after she completes her workup.   I had an extensive discussion with the patient regarding her disease process and also surgical therapies to alleviate the process. Also discussed what surgery will entail and I do think that she will be a reasonable candidate for robotic approach.  The risk the benefits and the possible complications were discussed with her in detail.  She seems to understand and is agreeable with the plan.  I specifically discussed risk of bleeding infection perforation some dysphagia and also changes in the diet after surgery. I personally think that probably best option will be repair of paraesophageal hernia with mesh with a partial fundoplication given prior dilation and dysphagia symptoms. He does have an upcoming cruise to [place] she wishes to enjoy that.  I did do think that we can wait after her return. I personally spent a total of 65 minutes in the care of the patient today including performing a medically appropriate exam/evaluation, counseling and educating, placing orders, referring and communicating with  other health care professionals, documenting clinical information in the EHR, independently interpreting and reviewing images studies and coordinating care.      Laneta Luna, MD [facility] General Surgeon

## 2023-10-16 ENCOUNTER — Ambulatory Visit
Admission: RE | Admit: 2023-10-16 | Discharge: 2023-10-16 | Disposition: A | Discharge status: Home / Self Care | Source: Ambulatory Visit | Attending: Surgery | Admitting: Surgery

## 2023-10-16 DIAGNOSIS — K449 Diaphragmatic hernia without obstruction or gangrene: Secondary | ICD-10-CM | POA: Insufficient documentation

## 2023-10-16 DIAGNOSIS — K219 Gastro-esophageal reflux disease without esophagitis: Secondary | ICD-10-CM | POA: Insufficient documentation

## 2023-10-16 MED ORDER — IOHEXOL 300 MG/ML  SOLN
100.0000 mL | Freq: Once | INTRAMUSCULAR | Status: AC | PRN
  Administered 2023-10-16: 100 mL via INTRAVENOUS

## 2023-10-31 ENCOUNTER — Encounter: Payer: Self-pay | Admitting: Surgery

## 2023-10-31 ENCOUNTER — Ambulatory Visit: Admitting: Surgery

## 2023-10-31 VITALS — BP 136/82 | HR 80 | Temp 97.9°F | Ht 64.0 in | Wt 185.0 lb

## 2023-10-31 DIAGNOSIS — K449 Diaphragmatic hernia without obstruction or gangrene: Secondary | ICD-10-CM

## 2023-10-31 DIAGNOSIS — K219 Gastro-esophageal reflux disease without esophagitis: Secondary | ICD-10-CM | POA: Diagnosis not present

## 2023-10-31 NOTE — Patient Instructions (Signed)
 We have spoken today about repairing your Hiatal Hernia. Your surgery will be scheduled at [facility] with Dr. Dana Duncan.  If you are on any injectable weight loss medication, you will need to stop taking your GLP-1 injectable (weight loss) medications 8 days before your surgery to avoid any complications with anesthesia.   Plan to be in the hospital for 1-2 days if the minimally invasive surgery is completed without having to make a bigger incision. If the bigger incision is made, you will most likely need to be in the hospital 4-6 days. You will be on a soft diet and need to recover for 2 weeks following your surgery prior to doing any of your normal activities. At the 2 week mark, we will see you in the office and if you are doing ok we will advance your diet and activity level as you tolerate.  Please see your Blue (Pre-care) Sheet for more information regarding your surgery. Our surgery scheduler will call you to verify surgery date and to go over information  You will need to arrange to be out of work for approximately 1-2 weeks and then you may return with a lifting restriction for 4 more weeks. If you have FMLA or Disability paperwork that needs to be filled out, please have your company fax your paperwork to [phone] or you may drop this by either office. This paperwork will be filled out within 3 days after your surgery has been completed.  Please call our office with any questions or concerns that you have regarding your surgery and recovery.    Laparoscopic Nissen Fundoplication Laparoscopic Nissen fundoplication is surgery to relieve heartburn and other problems caused by gastric fluids flowing up into your esophagus. The esophagus is the tube that carries food and liquid from your throat to your stomach. Normally, the muscle that sits between your stomach and your esophagus (lower esophageal sphincter or LES) keeps stomach fluids in your stomach. In some people, the LES does not work  properly, and stomach fluids flow up into the esophagus. This can happen when part of the stomach bulges through the LES (hiatal hernia). The backward flow of stomach fluids can cause a type of severe and long-standing heartburn that is called gastroesophageal reflux disease (GERD). You may need this surgery if other treatments for GERD have not helped. In a laparoscopic Nissen fundoplication, the upper part of your stomach is wrapped around the lower part of your esophagus to strengthen the LES and prevent reflux. If you have a hiatal hernia, it will also be repaired with this surgery. The procedure is done through several small incisions in your abdomen. It is performed using a thin, telescopic instrument (laparoscope) and other instruments that can pass through the scope or through other small incisions. Tell a health care provider about: Any allergies you have. All medicines you are taking, including vitamins, herbs, eye drops, creams, and over-the-counter medicines. Any problems you or family members have had with anesthetic medicines. Any blood disorders you have. Any surgeries you have had. Any medical conditions you have. What are the risks? Generally, this is a safe procedure. However, problems may occur, including: Difficulty swallowing (dysphagia). Bloating. Nausea or vomiting. Damage to the lung, causing a collapsed lung. Infection or bleeding. What happens before the procedure? Ask your health care provider about: Changing or stopping your regular medicines. This is especially important if you are taking diabetes medicines or blood thinners. Taking medicines such as aspirin and ibuprofen. These medicines can thin your  blood. Do not take these medicines before your procedure if your health care provider asks you not to. Follow your health care provider's instructions about eating or drinking restrictions. Plan to have someone take you home after the procedure. What happens during  the procedure? An IV tube will be inserted into one of your veins. It will be used to give you fluids and medicines during the procedure. You will be given a medicine that makes you fall asleep (general anesthetic). Your abdomen will be cleaned with a germ-killing solution (antiseptic). The surgeon will make a small incision in your abdomen and insert a tube through the incision. Your abdomen will be filled with a gas. This helps the surgeon to see your organs more easily and it makes more space to work. The surgeon will insert the laparoscope through the incision. The scope has a camera that will send pictures to a monitor in the operating room. The surgeon will make several other small incisions in your abdomen to insert the other instruments that are needed during the procedure. Another instrument (dilator) will be passed through your mouth and down your esophagus into the upper part of your stomach. The dilator will prevent your LES from being closed too tightly during surgery. The surgeon will pass the top portion of your stomach behind the lower part of your esophagus and wrap it all the way around. This will be stitched into place. If you have a hiatal hernia, it will be repaired during this procedure. All instruments will be removed, and your incisions will be closed under your skin with stitches (sutures). Skin adhesive strips may also be used. A bandage (dressing) will be placed on your skin over the incisions. The procedure may vary among health care providers and hospitals. What happens after the procedure? You will be moved to a recovery area. Your blood pressure, heart rate, breathing rate, and blood oxygen level will be monitored often until the medicines you were given have worn off. You will be given pain medicine as needed. Your IV tube will be kept in until you are able to drink fluids. This information is not intended to replace advice given to you by your health care provider.  Make sure you discuss any questions you have with your health care provider. Document Released: 04/17/2014 Document Revised: 09/02/2015 Document Reviewed: 11/26/2013 Elsevier Interactive Patient Education  2017 [employer]   Laparoscopic Nissen Fundoplication, Care After Refer to this sheet in the next few weeks. These instructions provide you with information about caring for yourself after your procedure. Your health care provider may also give you more specific instructions. Your treatment has been planned according to current medical practices, but problems sometimes occur. Call your health care provider if you have any problems or questions after your procedure. What can I expect after the procedure? After the procedure, it is common to have: Difficulty swallowing (dysphagia). Excess gas (bloating). Follow these instructions at home: Medicines  Take medicines only as directed by your health care provider. Do not drive or operate heavy machinery while taking pain medicine. Incision care  There are many different ways to close and cover an incision, including stitches (sutures), skin glue, and adhesive strips. Follow your health care provider's instructions about: Incision care. Bandage (dressing) changes and removal. Incision closure removal. Check your incision areas every day for signs of infection. Watch for: Redness, swelling, or pain. Fluid, blood, or pus. Do not take baths, swim, or use a hot tub until your health care  provider approves. Take showers as directed by your health care provider. Eating and drinking  Follow your health care provider's instructions about eating. You may need to follow a very soft diet for 2 weeks, followed by a diet of more regular foods for 2 weeks, no breads. You should return to your usual diet gradually. Drink enough fluid to keep your urine clear or pale yellow. Activity  Return to your normal activities as directed by your health care  provider. Ask your health care provider what activities are safe for you. Avoid strenuous exercise. Do not lift anything that is heavier than 10 lb (4.5 kg). Ask your health care provider when you can: Return to sexual activity. Drive. Go back to work. Contact a health care provider if: You have a fever. Your pain gets worse or is not helped by medicine. You have frequent nausea or vomiting. You have continued abdominal bloating. You have an ongoing (persistent) cough. You have redness, swelling, or pain in any incision areas. You have fluid, blood, or pus coming from any incisions. Get help right away if: You have trouble breathing. You are unable to swallow. You have persistent vomiting. You have blood in your vomit. You have severe abdominal pain. This information is not intended to replace advice given to you by your health care provider. Make sure you discuss any questions you have with your health care provider. Document Released: 11/18/2003 Document Revised: 09/02/2015 Document Reviewed: 11/26/2013 Elsevier Interactive Patient Education  2017 [employer]   Diet After Nissen Fundoplication Surgery This diet information is for patients who have recently had Nissen fundoplication surgery to correct reflux disease or to repair various types of hernias, such as hiatal hernia and intrathoracic stomach. This diet may also be used for other gastrointestinal surgeries, such as Heller myotomy and repair of achalasia. The diet will help control diarrhea, excess gas and swallowing problems, which may occur after this type of surgery. Keeping Your Stomach from Stretching Eat small, frequent meals (six to eight per day). This will help you consume the majority of the nutrients you need without causing your stomach to feel full or distended.  Drinking large amounts of fluids with meals can stretch your stomach. You may drink fluids between meals as often as you like, but limit fluids to 1/2  cup (4 fluid ounces) with meals and one cup (8 fluid ounces) with snacks.  Sit upright while eating and stay upright for 30 minutes after each meal. Gravity can help food move through your digestive tract. Do not lie down after eating. Sit upright for 2 hours after your last meal or snack of the day.  Eat very slowly. Take your time when eating.  Take small bites and chew your food well to help aid in swallowing and digestion.  Avoid crusty breads and sticky, gummy foods, such as bananas, fresh doughy breads, rolls and doughnuts. These types of foods become sticky and difficult to swallow.  Toasted breads tend to be better tolerated.  Lastly, if you eat sweets, consume them at the end of your meal to avoid a group of symptoms referred to as "dumping syndrome". This describes the rapid emptying of foods from the stomach to the small intestine. Sweetened beverages, candy and desserts move more rapidly and dump quickly into the intestines. This can cause symptoms of nausea, weakness, cold sweats, cramps, diarrhea and dizzy spells.  Avoiding Gas Avoid drinking through a straw. Do not chew gum or tobacco. These actions cause you to swallow  air, which produces excess gas in your stomach. Chew with your mouth closed.  Avoid any foods that cause stomach gas and distention. These foods include corn, dried beans, peas, lentils, onions, broccoli, cauliflower and any food from the cabbage family.  Avoid carbonated drinks, alcohol, citrus and tomato products.  When will I be able to eat a soft diet? After Nissen fundoplication surgery, your diet will be advanced slowly by your surgeon. Generally, you will be on a clear liquid diet for the first few meals. Then you will advance to the full liquid diet for a meal or two and eventually to a Nissen soft diet. Please be aware that each patient's tolerance to food is different. Your doctor will advance your diet depending on how well you progress after surgery. Clear  Liquid Diet  The first diet after surgery is the clear liquid diet. It includes the following liquids: Apple juice  Cranberry juice  Grape juice  Chicken broth  Beef broth  Flavored gelatin (Jell-O)  Decaf tea and coffee  Caffeinated beverages are permitted based on tolerance  Popsicles  [place] ice Carbonated drinks (sodas) are not allowed for the first six to eight weeks after surgery. After this time you can try them again in small amounts.  Full Liquid Diet The full liquid diet contains anything on the clear liquid diet, plus: Milk, soy, rice and almond (no chocolate)  Cream of wheat, cream of rice, grits  Strained creamed soups (no tomato or broccoli)  Vanilla and strawberry-flavored ice cream  Sherbet  Blended, custard styled or whipped yogurt (plain or vanilla only)  Vanilla and butterscotch pudding (no chocolate or coconut)  Nutritional drinks including Ensure, Boost, Carnation Instant Breakfast (no chocolate-flavored) Note: Dairy products, such as milk, ice cream and pudding, may cause diarrhea in some people just after surgery. You may need to avoid milk products. If so, substitute them with lactose-free beverages, such as soy, rice, Lactaid or almond milks.  Nissen Soft Diet Food Category Foods to Choose Foods to Avoid  Beverages Milk, such as, whole, 2%, 1%, non-fat, or skim, soy, rice, almond  Caffeinated and decaf tea and coffee  Powdered drink mixes (in moderation)  Non-citrus juices (apple, grape, cranberry or blends of these)  Fruit nectars  Nutritional drinks including Boost, Ensure, Carnation Instant Breakfast Chocolate milk, cocoa or other chocolate-flavored drinks  Carbonated drinks  Alcohol  Citrus juices like orange, grapefruit, lemon and lime  Breads Crackers (saltine, butter, soda, graham, Goldfish and Cheese Nips)   Untoasted bread, bagels, [place] and hard rolls, English muffins  Crackers with nuts, seeds, fresh or dried fruit, coconut, or  highly seasoned, such as garlic or onion-flavored  Sweet rolls, coffee cake or doughnuts  Cereals Well cooked cereals, such as oatmeal (plain or flavored)  Cold cereal (Cornflakes, Rice Krispies, Cheerios, Special K plain, Rice Chex and puffed rice) Very coarse cereal, such as bran, shredded wheat  Any cereal with fresh or dried fruit, coconut, seeds or nuts  Desserts Eat in moderation and do not eat desserts or sweets by themselves. Plain cakes, cookies and cream-filled pies  Vanilla and butterscotch pudding or custard  Ice cream, ice milk, frozen yogurt and sherbet  Gelatin made from allowed foods  Fruit ices and popsicles Desserts containing chocolate, coconut, nuts, seeds, fresh or dried fruit, peppermint or spearmint  Eggs  Poached, hard boiled or scrambled Fried eggs and highly seasoned eggs (deviled eggs)  Fats Eat in moderation. Butter and margarine  Mayonnaise and vegetable oils  Mildly seasoned cream sauces and gravies  Plain cream cheese  Sour cream Highly seasoned salad dressings, cream sauces and gravies  Bacon, bacon fat, ham fat, lard and salt pork  Fried foods  Nuts  Fruits Fruit juice  Any canned or cooked fruit except those listed in the AVOID column ALL fresh fruits, such as citrus, apples, and pineapple  Canned pineapple  Dried fruits, such as raisins, berries  Fruits with seeds, such as berries, kiwi and figs  Meat, Fish, Poultry, and Dairy Products Meats may be ground, minced or chopped to ease swallowing and digestion  Tender, well cooked and moist cuts of beef, chicken, [place] and pork  Veal and lamb  Flaky, cooked fish  Canned tuna  Cottage and ricotta cheeses  Mild cheese, such as American, brick, mozzarella and baby Swiss  Creamy peanut butter  Plain custard or blended fruit yogurt  Moist casseroles, such as macaroni & cheese, tuna noodle  Grilled or toasted cheese sandwich Tough meats with a lot of gristle  Fried, highly seasoned, smoked and fatty  meat, fish or poultry, such as frankfurters, luncheon meats, sausage, bacon, spare ribs, beef brisket, sardines, anchovies, duck and goose  Chili and other entrees made with pepper or chili pepper  Shellfish  Strongly flavored cheeses, such as sharp cheese, extra sharp cheddar, cheese containing peppers or other seasonings  Crunchy peanut butter  Any yogurt with nuts, seeds, coconut, strawberries or raspberries  Potatoes and Starches Peeled, mashed or boiled white or sweet potatoes  Oven-baked potatoes without skin  Well cooked white rice, enriched noodles, barley, spaghetti, macaroni and other pastas Fried potatoes, potato skins and potato chips  Hard and soft taco shells  Fried, brown or wild rice  Soups Mildly flavored meat stocks  Cream soups made from allowed foods Highly seasoned soups and tomato based soups, cream soups made with gas producing vegetables, such as broccoli, cauliflower, onion, etc.  Sweets and Snacks Use in moderation and do not eat large amounts of sweets by themselves. Syrup, honey, jelly and seedless jam  Plain hard candies and plain candies made with allowed ingredients  Molasses  Marshmallows  Other candy made from allowed ingredients  Thin pretzels Jam, marmalade and preserves  Chocolate in any form  Any candy containing nuts, coconut, seeds, peppermint, spearmint or dried or fresh fruit  Popcorn, potato chips, tortilla chips  Soft or hard thick pretzels, such as sourdough  Vegetables Well cooked soft vegetables without seeds or skins, such as asparagus tips, beets, carrots, green and wax beans, chopped spinach, tender canned baby peas, squash and pumpkin Raw vegetables, tomatoes, tomato juice, tomato sauce and V-8 juice(tomato based products can irritate the stomach)  Gas producing vegetables, such as broccoli, Brussel sprouts, cabbage, cauliflower, onions, corn, cucumber, green peppers, rutabagas, turnips, radishes and sauerkraut  Dried beans, peas and  lentils  Miscellaneous Salt and spices in moderation  Mustard and vinegar in moderation Fried or highly seasoned foods  Coconut and seeds  Pickles and olives  Chili sauces, ketchup, barbecue sauce, horseradish, black pepper, chili powder and onion and garlic seasonings  Any other strongly flavored seasoning, condiment, spice or herb not tolerated  Any food not tolerated

## 2023-10-31 NOTE — Progress Notes (Signed)
 Outpatient Surgical Follow Up  10/31/2023  Olivia Morgan is an 68 y.o. female.   No chief complaint on file.   HPI: Olivia Morgan is a 68 year old very pleasant female with long history of reflux.  She has been on PPI and this has been a chronic problem.  Now she is on PPI and H2 blocker with only partial relief of symptoms.  She does endorse having significant reflux symptoms that are worse at night and she also wakes up in the middle of the night with regurgitation.  She also had some chronic pulmonary issues and frequent cough.  She reports some mild dysphagia and she notes that she has adapted her way she eats food and takes quite a bit of time.   She Did have a long history of reflux and was followed by Dr. Dessa in the past, has had prior endoscopy with multiple dilations.  She did have a most recent upper endoscopy a couple months ago that have personally reviewed showing evidence of a large hiatal hernia as well as an esophageal stricture that was dilated.  SHe had also multiple gastric polyps. She also had recent barium swallow showing a large type III paraesophageal hernia, also recent CT confirming giant type III paraesophageal H., please note that I disagree w radiologist read as this is a type III and not a sliding type.  Please note that I have personally reviewed the images. She is very independent and is able to perform more than 4 METS of activity without any shortness of breath or chest pain. SHe has had multiple orthopedic surgeries including knee replacements. She is going on a cruise to [place] next month and she wants to do surgery on October  She is retired Air[employer] pilot  Past Medical History:  Diagnosis Date   Acute pain of left shoulder 06/08/2020   Allergic reaction 02/12/2017   At risk for difficult intubation on preoperative anesthesia assessment    Bilateral hearing loss    wears hearing aids   COVID-19 04/2022   Depression    Genital herpes    GERD (gastroesophageal  reflux disease)    Herpes simplex virus infection    History of migraine    Murmur    Neuropathy of both feet 06/05/2015   OA (osteoarthritis) of hip    left   Osteopenia of lumbar spine 2010   Plantar fasciitis    Stomatitis    Wears hearing aid in both ears     Past Surgical History:  Procedure Laterality Date   CATARACT EXTRACTION W/PHACO Right 01/02/2023   Procedure: CATARACT EXTRACTION PHACO AND INTRAOCULAR LENS PLACEMENT (IOC) RIGHT   CLAREON VIVITY TORIC LENS 6.11 00:33.5;  Surgeon: Jaye Fallow, MD;  Location: [facility];  Service: Ophthalmology;  Laterality: Right;   CATARACT EXTRACTION W/PHACO Left 01/16/2023   Procedure: CATARACT EXTRACTION PHACO AND INTRAOCULAR LENS PLACEMENT (IOC) LEFT CLAREON VIVITY TORIC LENS 4.26 00:31.0;  Surgeon: Jaye Fallow, MD;  Location: [facility];  Service: Ophthalmology;  Laterality: Left;   CESAREAN SECTION     COLONOSCOPY  2010   COLONOSCOPY WITH PROPOFOL  N/A 11/27/2018   Procedure: COLONOSCOPY WITH PROPOFOL ;  Surgeon: Unk Corinn Skiff, MD;  Location: [facility];  Service: Endoscopy;  Laterality: N/A;   ENDOSCOPIC CONCHA BULLOSA RESECTION Left 05/23/2018   Procedure: ENDOSCOPIC CONCHA BULLOSA RESECTION;  Surgeon: Edda Mt, MD;  Location: [facility];  Service: ENT;  Laterality: Left;   FOOT SURGERY Right    REPLACEMENT TOTAL KNEE  3/13   Dr[facility]Hallows at [facility]   SEPTOPLASTY Bilateral 05/23/2018   Procedure: SEPTOPLASTY;  Surgeon: Edda Mt, MD;  Location: [facility];  Service: ENT;  Laterality: Bilateral;   TOTAL HIP ARTHROPLASTY Left 10/2008   TURBINATE REDUCTION Bilateral 05/23/2018   Procedure: INFERIOR TURBINATE REDUCTION;  Surgeon: Edda Mt, MD;  Location: [facility];  Service: ENT;  Laterality: Bilateral;   UPPER GI ENDOSCOPY  1992    Family History  Problem Relation Age of Onset   Aneurysm Mother    Stroke Mother    Heart disease Mother    Heart  disease Father 35   Hypertension Father    Lung disease Father    Heart disease Paternal Grandfather    Panic disorder Brother    Milk intolerance Daughter    Polycystic ovary syndrome Daughter    Obesity Brother    Hyperlipidemia Brother    Hypertension Brother    Diabetes Brother    Cancer Neg Hx    COPD Neg Hx    Breast cancer Neg Hx     Social History:  reports that she has never smoked. She has been exposed to tobacco smoke. She has never used smokeless tobacco. She reports current alcohol use. She reports that she does not use drugs.  Allergies:  Allergies  Allergen Reactions   Levofloxacin Hives   Morphine Nausea And Vomiting and Nausea Only    Causes hallucinations     Celecoxib Other (See Comments)    SOB   Celexa [Citalopram Hydrobromide]    Citrullus Vulgaris Swelling    Per pt says causes throat to swell   Cymbalta [Duloxetine Hcl] Hives   Gabapentin  Other (See Comments)    anger   Levaquin [Levofloxacin In D5w] Hives   Valium [Diazepam] Other (See Comments)    hallucinations   Monistat [Miconazole] Rash   Prednisone  Anxiety    Medications reviewed.    ROS Full ROS performed and is otherwise negative other than what is stated in HPI   BP 136/82   Pulse 80   Temp 97.9 F (36.6 C) (Oral)   Ht 5' 4 (1.626 m)   Wt 185 lb (83.9 kg)   SpO2 97%   BMI 31.76 kg/m   Physical Exam Vitals and nursing note reviewed. Exam conducted with a chaperone present.  Constitutional:      General: She is not in acute distress.    Appearance: Normal appearance. She is not ill-appearing.  Cardiovascular:     Rate and Rhythm: Normal rate and regular rhythm.     Heart sounds: No murmur heard. Pulmonary:     Effort: Pulmonary effort is normal. No respiratory distress.     Breath sounds: Normal breath sounds. No stridor. No wheezing or rhonchi.  Abdominal:     General: Abdomen is flat. There is no distension.     Palpations: There is no mass.     Tenderness:  There is no abdominal tenderness. There is no guarding or rebound.     Hernia: No hernia is present.  Musculoskeletal:        General: No swelling or tenderness. Normal range of motion.     Cervical back: Normal range of motion and neck supple. No rigidity or tenderness.  Skin:    General: Skin is warm and dry.     Capillary Refill: Capillary refill takes less than 2 seconds.  Neurological:     General: No focal deficit present.     Mental Status:  She is alert and oriented to person, place, and time.  Psychiatric:        Mood and Affect: Mood normal.        Behavior: Behavior normal.        Thought Content: Thought content normal.        Judgment: Judgment normal.       Assessment/Plan:  68 year old female with symptomatic paraesophageal hernia type III with  most of her stomach within the mediastinum. I  Discussed with the patient in detail about her disease process.  I do think that she will require repair given symptoms and given her pathology. She wants to schedule her surgery in October.   I had an extensive discussion with the patient regarding her disease process and also surgical therapies to alleviate the process. Also discussed what surgery will entail and I do think that she will be a reasonable candidate for robotic approach.  The risks, the benefits and the possible complications were discussed with her in detail.  Bleeding, infection esophageal and bowel injuries. She seems to understand and is agreeable with the plan.   I personally think that probably best option will be repair of paraesophageal hernia with mesh with a partial fundoplication given prior dilation and dysphagia symptoms.  I personally spent a total of 40 minutes in the care of the patient today including performing a medically appropriate exam/evaluation, counseling and educating, placing orders, referring and communicating with other health care professionals, documenting clinical information in the EHR,  independently interpreting and reviewing images studies and coordinating care.   Laneta Luna, MD [facility] General Surgeon

## 2023-11-06 ENCOUNTER — Telehealth: Payer: Self-pay | Admitting: Surgery

## 2023-11-06 NOTE — Telephone Encounter (Signed)
 Patient has been advised of Pre-Admission date/time, and Surgery date at [facility].  Surgery Date: 01/22/24 Preadmission Testing Date: Preadmissions to call patient.    Patient informed of the scheduling process and surgery information given at time of office visit.   Patient has been made aware to call [phone], between 1-3:00pm the day before surgery, to find out what time to arrive for surgery.

## 2023-11-14 ENCOUNTER — Ambulatory Visit: Admitting: Podiatry

## 2023-11-19 ENCOUNTER — Ambulatory Visit: Admitting: Podiatry

## 2023-12-03 ENCOUNTER — Ambulatory Visit (INDEPENDENT_AMBULATORY_CARE_PROVIDER_SITE_OTHER): Admitting: Podiatry

## 2023-12-03 DIAGNOSIS — M722 Plantar fascial fibromatosis: Secondary | ICD-10-CM

## 2023-12-03 MED ORDER — TRIAMCINOLONE ACETONIDE 40 MG/ML IJ SUSP
20.0000 mg | Freq: Once | INTRAMUSCULAR | Status: AC
  Administered 2023-12-03: 20 mg

## 2023-12-03 NOTE — Progress Notes (Signed)
 She presents today after having not seen her since October 2022.  She states that her left heels been bothering her for the past several months appears to be more on the outside than the medial aspect.  She is also having problems with a hiatal hernia.  Objective: Pulses are palpable.  She has pain on palpation medial calcaneal tubercle of her left heel but worst pain on the lateral aspect of the left foot mostly around the lateral calcaneal tubercle.  Assessment: Plantar fasciitis lateral  Plan: Discussed appropriate shoe gear.  Injected the lateral aspect of the left heel 20 mg Kenalog  5 mg Marcaine  point maximal tenderness.

## 2023-12-04 ENCOUNTER — Other Ambulatory Visit: Payer: Self-pay | Admitting: Internal Medicine

## 2023-12-04 DIAGNOSIS — K449 Diaphragmatic hernia without obstruction or gangrene: Secondary | ICD-10-CM

## 2023-12-04 DIAGNOSIS — K2100 Gastro-esophageal reflux disease with esophagitis, without bleeding: Secondary | ICD-10-CM

## 2023-12-04 DIAGNOSIS — F320 Major depressive disorder, single episode, mild: Secondary | ICD-10-CM

## 2023-12-25 ENCOUNTER — Telehealth: Payer: Self-pay

## 2023-12-25 ENCOUNTER — Telehealth: Payer: Self-pay | Admitting: Surgery

## 2023-12-25 NOTE — Telephone Encounter (Signed)
 Patient calls to cancel her robotic paraesophageal hernia surgery scheduled for 01/22/24.  She states is going for a 2nd opinion. Will call us  back if she decides to reschedule.  Patient last saw Dr. Jordis in office on 10/31/23, will need another follow up in office.

## 2023-12-25 NOTE — Telephone Encounter (Signed)
 Message left for the patient to call the office to schedule her Pre op appointment with Dr Jordis for her surgery scheduled for October 14th.

## 2024-01-15 ENCOUNTER — Other Ambulatory Visit

## 2024-01-22 ENCOUNTER — Ambulatory Visit: Admit: 2024-01-22 | Admitting: Surgery

## 2024-01-22 SURGERY — REPAIR, HERNIA, PARAESOPHAGEAL, ROBOT-ASSISTED
Anesthesia: General

## 2024-05-06 ENCOUNTER — Other Ambulatory Visit: Payer: Self-pay | Admitting: Internal Medicine

## 2024-05-06 DIAGNOSIS — Z760 Encounter for issue of repeat prescription: Secondary | ICD-10-CM

## 2024-05-06 NOTE — Telephone Encounter (Signed)
 Requested medications are due for refill today.  yes  Requested medications are on the active medications list.  yes  Last refill. 08/31/2023 #90 1 rf  Future visit scheduled.   no  Notes to clinic.  Dr. Fernande listed as pcp.    Requested Prescriptions  Pending Prescriptions Disp Refills   valACYclovir  (VALTREX ) 500 MG tablet [Pharmacy Med Name: VALACYCLOVIR  500MG  TABLETS] 90 tablet 1    Sig: TAKE 1 TABLET(500 MG) BY MOUTH DAILY     Antimicrobials:  Antiviral Agents - Anti-Herpetic Failed - 05/06/2024  5:51 PM      Failed - Valid encounter within last 12 months    Recent Outpatient Visits   None
